# Patient Record
Sex: Female | Born: 1955 | Race: White | Hispanic: No | Smoking: Never smoker
Health system: Southern US, Community
[De-identification: ages and names within clinical notes are randomized; demographics above are authoritative.]

## PROBLEM LIST (undated history)

## (undated) DIAGNOSIS — Z9889 Other specified postprocedural states: Secondary | ICD-10-CM

## (undated) DIAGNOSIS — T4145XA Adverse effect of unspecified anesthetic, initial encounter: Secondary | ICD-10-CM

## (undated) DIAGNOSIS — E079 Disorder of thyroid, unspecified: Secondary | ICD-10-CM

## (undated) DIAGNOSIS — F419 Anxiety disorder, unspecified: Secondary | ICD-10-CM

## (undated) DIAGNOSIS — I10 Essential (primary) hypertension: Secondary | ICD-10-CM

## (undated) DIAGNOSIS — E039 Hypothyroidism, unspecified: Secondary | ICD-10-CM

## (undated) DIAGNOSIS — T8859XA Other complications of anesthesia, initial encounter: Secondary | ICD-10-CM

## (undated) DIAGNOSIS — R112 Nausea with vomiting, unspecified: Secondary | ICD-10-CM

## (undated) DIAGNOSIS — E119 Type 2 diabetes mellitus without complications: Secondary | ICD-10-CM

## (undated) DIAGNOSIS — M1990 Unspecified osteoarthritis, unspecified site: Secondary | ICD-10-CM

## (undated) DIAGNOSIS — E785 Hyperlipidemia, unspecified: Secondary | ICD-10-CM

## (undated) HISTORY — PX: EYE SURGERY: SHX253

## (undated) HISTORY — PX: JOINT REPLACEMENT: SHX530

## (undated) HISTORY — PX: BELPHAROPTOSIS REPAIR: SHX369

## (undated) HISTORY — PX: BACK SURGERY: SHX140

## (undated) HISTORY — PX: SHOULDER SURGERY: SHX246

## (undated) HISTORY — PX: REPLACEMENT TOTAL KNEE: SUR1224

## (undated) HISTORY — PX: DILATION AND CURETTAGE OF UTERUS: SHX78

---

## 1999-03-13 ENCOUNTER — Encounter: Payer: Self-pay | Admitting: Neurosurgery

## 1999-03-13 ENCOUNTER — Ambulatory Visit (HOSPITAL_COMMUNITY): Admission: RE | Admit: 1999-03-13 | Discharge: 1999-03-13 | Payer: Self-pay | Admitting: Neurosurgery

## 1999-03-26 ENCOUNTER — Ambulatory Visit (HOSPITAL_COMMUNITY): Admission: RE | Admit: 1999-03-26 | Discharge: 1999-03-26 | Payer: Self-pay | Admitting: Neurosurgery

## 1999-03-26 ENCOUNTER — Encounter: Payer: Self-pay | Admitting: Neurosurgery

## 1999-04-09 ENCOUNTER — Ambulatory Visit (HOSPITAL_COMMUNITY): Admission: RE | Admit: 1999-04-09 | Discharge: 1999-04-09 | Payer: Self-pay | Admitting: Neurosurgery

## 1999-04-09 ENCOUNTER — Encounter: Payer: Self-pay | Admitting: Neurosurgery

## 1999-07-04 ENCOUNTER — Ambulatory Visit (HOSPITAL_COMMUNITY): Admission: RE | Admit: 1999-07-04 | Discharge: 1999-07-04 | Payer: Self-pay | Admitting: Neurosurgery

## 1999-07-04 ENCOUNTER — Encounter: Payer: Self-pay | Admitting: Neurosurgery

## 1999-08-03 ENCOUNTER — Encounter: Payer: Self-pay | Admitting: Neurosurgery

## 1999-08-07 ENCOUNTER — Encounter: Payer: Self-pay | Admitting: Neurosurgery

## 1999-08-07 ENCOUNTER — Inpatient Hospital Stay (HOSPITAL_COMMUNITY): Admission: RE | Admit: 1999-08-07 | Discharge: 1999-08-11 | Payer: Self-pay | Admitting: Neurosurgery

## 1999-09-19 ENCOUNTER — Encounter: Payer: Self-pay | Admitting: Neurosurgery

## 1999-09-19 ENCOUNTER — Encounter: Admission: RE | Admit: 1999-09-19 | Discharge: 1999-09-19 | Payer: Self-pay | Admitting: Neurosurgery

## 2000-03-11 ENCOUNTER — Encounter: Payer: Self-pay | Admitting: Neurosurgery

## 2000-03-11 ENCOUNTER — Ambulatory Visit (HOSPITAL_COMMUNITY): Admission: RE | Admit: 2000-03-11 | Discharge: 2000-03-11 | Payer: Self-pay | Admitting: Neurosurgery

## 2000-05-30 ENCOUNTER — Inpatient Hospital Stay (HOSPITAL_COMMUNITY): Admission: RE | Admit: 2000-05-30 | Discharge: 2000-06-01 | Payer: Self-pay | Admitting: Neurosurgery

## 2000-05-30 ENCOUNTER — Encounter: Payer: Self-pay | Admitting: Neurosurgery

## 2000-06-09 ENCOUNTER — Encounter: Payer: Self-pay | Admitting: Neurosurgery

## 2000-06-09 ENCOUNTER — Encounter: Admission: RE | Admit: 2000-06-09 | Discharge: 2000-06-09 | Payer: Self-pay | Admitting: Neurosurgery

## 2000-09-30 ENCOUNTER — Ambulatory Visit (HOSPITAL_COMMUNITY): Admission: RE | Admit: 2000-09-30 | Discharge: 2000-09-30 | Payer: Self-pay | Admitting: Internal Medicine

## 2000-10-02 ENCOUNTER — Encounter: Payer: Self-pay | Admitting: Internal Medicine

## 2000-10-02 ENCOUNTER — Ambulatory Visit (HOSPITAL_COMMUNITY): Admission: RE | Admit: 2000-10-02 | Discharge: 2000-10-02 | Payer: Self-pay | Admitting: Internal Medicine

## 2001-10-07 ENCOUNTER — Ambulatory Visit (HOSPITAL_COMMUNITY): Admission: RE | Admit: 2001-10-07 | Discharge: 2001-10-07 | Payer: Self-pay | Admitting: Internal Medicine

## 2001-10-07 ENCOUNTER — Encounter: Payer: Self-pay | Admitting: Internal Medicine

## 2001-11-13 ENCOUNTER — Encounter: Admission: RE | Admit: 2001-11-13 | Discharge: 2001-11-13 | Payer: Self-pay | Admitting: Neurosurgery

## 2001-11-13 ENCOUNTER — Encounter: Payer: Self-pay | Admitting: Neurosurgery

## 2003-07-05 ENCOUNTER — Ambulatory Visit (HOSPITAL_COMMUNITY): Admission: RE | Admit: 2003-07-05 | Discharge: 2003-07-05 | Payer: Self-pay | Admitting: Internal Medicine

## 2004-04-27 ENCOUNTER — Ambulatory Visit (HOSPITAL_COMMUNITY): Admission: RE | Admit: 2004-04-27 | Discharge: 2004-04-27 | Payer: Self-pay | Admitting: Internal Medicine

## 2006-07-25 ENCOUNTER — Ambulatory Visit (HOSPITAL_COMMUNITY): Admission: RE | Admit: 2006-07-25 | Discharge: 2006-07-25 | Payer: Self-pay | Admitting: Internal Medicine

## 2007-11-16 ENCOUNTER — Emergency Department (HOSPITAL_COMMUNITY): Admission: EM | Admit: 2007-11-16 | Discharge: 2007-11-16 | Payer: Self-pay | Admitting: Emergency Medicine

## 2010-03-19 ENCOUNTER — Ambulatory Visit (HOSPITAL_COMMUNITY): Admission: RE | Admit: 2010-03-19 | Discharge: 2010-03-19 | Payer: Self-pay | Admitting: Gastroenterology

## 2010-05-01 ENCOUNTER — Ambulatory Visit (HOSPITAL_COMMUNITY): Admission: RE | Admit: 2010-05-01 | Discharge: 2010-05-01 | Payer: Self-pay | Admitting: Gastroenterology

## 2010-07-14 ENCOUNTER — Encounter: Payer: Self-pay | Admitting: Family Medicine

## 2010-07-15 ENCOUNTER — Encounter: Payer: Self-pay | Admitting: Neurosurgery

## 2010-07-16 ENCOUNTER — Ambulatory Visit (HOSPITAL_COMMUNITY): Admission: RE | Admit: 2010-07-16 | Payer: Self-pay | Source: Home / Self Care | Admitting: Gastroenterology

## 2010-11-09 NOTE — Op Note (Signed)
[place]. [facility]  Patient:    Monique Weiss, Monique Weiss                       MRN: [id number] Proc. Date: 08/07/99 Adm. Date:  [id number] Attending:  Emeterio Reeve                           Operative Report  PREOPERATIVE DIAGNOSIS:  Recurrent herniated disk, L4-5, severe spondylosis and  spinal stenosis, L3-4 and L2-3.  POSTOPERATIVE DIAGNOSIS:  Recurrent herniated disk, L4-5, severe spondylosis and spinal stenosis, L3-4 and L2-3.  OPERATION PERFORMED:  L2-3, L3-4, L4-5 laminectomy, diskectomy, posterior lumbar interbody fusion with Ray threaded fusion cage.  SURGEON:  Payton Doughty, M.D.  ANESTHESIA:  General endotracheal.  PREP:  Sterile Betadine prep and scrub with alcohol wipe.  COMPLICATIONS:  None.  ASSISTANT:  Hewitt Shorts, M.D.  INDICATIONS FOR PROCEDURE:  The patient is a 55 year old right-handed white female with severe spondylosis at 2-3, 3-4 and spondylosis and recurrent disk at 4-5.   DESCRIPTION OF PROCEDURE:  The patient was taken to the operating room, smoothly anesthetized and intubated, and placed prone on the operating table.  Following  shave, prep and drape in the usual sterile fashion the skin was infiltrated with 1% lidocaine 1:400,000 epinephrine and the skin was incised from the bottom of L4 o the top of L2.  The lamina of L2, L3 and L4 were exposed bilaterally in the subperiosteal plane.  Intraoperative x-ray was taken to confirm correctness of he level.  Starting on the left side, the pars interarticularis lamina and inferior facet of L4, L3 and L2 were removed bilaterally.  Similarly the superior facet f L5, L4 and L3 were removed bilaterally.  On the left side at the previous laminectomy site after removal of ligamentum flavum there was found to be recurrent herniated disk which was removed without difficulty.  The other level removal of the ligamentum flavum, dissection into the nerve roots at each  pedicle revealed  severe spondylosis and lateral recess narrowing.  Having relieved this narrowing by decompression, the nerve roots were carefully explored and found to be free. Diskectomy was then carried out at 2-3 and 3-4 bilaterally.  The wound was irrigated and hemostasis assured.  The nerve roots inspected for decompression.  Ray cages were then placed, 12 x 21 mm at 4-5 and 14 x 21 mm at 3-4 and 2-3. Intraoperative x-ray showed good placement of cages.  They were packed with bone graft harvested from facet joints and then capped.  The wound was irrigated and  hemostasis assured.  The fascia was reapproximated with 0 Vicryl in interrupted  fashion.  The subcutaneous tissues were reapproximated with 0 Vicryl in interrupted fashion.  The subcuticular tissues were reapproximated with 3-0 Vicryl in interruted fashion. The skin was closed with 3-0 nylon in a running locked fashion. Betadine Telfa dressing was appled and made occlusive with Op-Site.  The patient then returned to the recovery room in good condition. DD:  08/07/99 TD:  08/07/99 Job: [id number]

## 2010-11-09 NOTE — Op Note (Signed)
[place]. [facility]  Patient:    DOCIA, KLAR                       MRN: [id number] Proc. Date: 05/30/00 Adm. Date:  [id number] Attending:  Emeterio Reeve                           Operative Report  PREOPERATIVE DIAGNOSIS:  Spondylosis with osteophytes at L5-S1 on the left side.  POSTOPERATIVE DIAGNOSIS:  Spondylosis with osteophytes at L5-S1 on the left side.  PROCEDURE:  L5-S1 laminectomy, diskectomy, posterior lumbar interbody fusion with the Ray threaded fusion cage.  SURGEON:  Payton Doughty, M.D.  ASSISTANT:  Stefani Dama, M.D.  ANESTHESIA:  Sterile Betadine prep and scrub with alcohol wipe.  COMPLICATIONS:  None.  DESCRIPTION OF PROCEDURE:  A 55 year old right-handed white lady with severe spondylosis and left L5 radiculopathy.  She was taken to the operating room, smoothly anesthetized and intubated, placed prone on the operating table. Following shave, prepped and draped in the usual fashion.  The skin was infiltrated with 1% lidocaine with 1:400,000 epinephrine.  Her old skin incision done by me was extended inferiorly approximately 6 cm to expand the L5 and S1 interspace.  The L5 and S1 lamina were exposed bilaterally out over the 5-1 facet joints, and intraoperative x-ray confirmed correctness of the level.  The pars interarticularis, lamina, and inferior facet of L5 and the superior facet of S1 were removed bilaterally, as well as the ligamentum flavum.  This exposed the L5 root and the S1 root as they traversed their respective pedicles.  On the left side, there was a large bridging osteophyte extending from the superior aspect of S1 making contact with the pedicle of L5, with significant encumbrance of the left L5 root as it entered its foramen.  The S1 root was similarly affected.  The nerve roots were carefully dissected and retracted, and the osteotome was used to remove the osteophyte and uncover the disk space.  Diskectomy  was then carried out in a routine fashion.  On the right side, there was no osteophyte, simply a large bulging disk.  This was also removed without difficulty.  Thus, both 5 and both 1 roots were decompressed.  Ray threaded 12 x 21 fusion cages were then placed. Intraoperative x-ray showed good placement of the cages.  They were packed with bone graft harvested from the facet joints and cap.  The wound was irrigated, hemostasis assured.  The fascia was approximated with 0 Vicryl in interrupted fashion.  Subcutaneous tissue was reapproximated with 0 Vicryl in interrupted fashion.  Skin was closed with 3-0 nylon in a running locked fashion.  A Betadine and Telfa dressing was applied and made occlusive with OpSite.  The patient then returned to the recovery room in good condition. DD:  05/30/00 TD:  05/30/00 Job: [id number]

## 2011-03-20 LAB — DIFFERENTIAL
Basophils Absolute: 0
Basophils Relative: 0
Eosinophils Absolute: 0.1
Eosinophils Relative: 1
Lymphocytes Relative: 16
Lymphs Abs: 1.9
Monocytes Absolute: 1
Monocytes Relative: 9
Neutro Abs: 8.8 — ABNORMAL HIGH
Neutrophils Relative %: 74

## 2011-03-20 LAB — BASIC METABOLIC PANEL
BUN: 12
CO2: 28
Calcium: 8.8
Chloride: 103
Creatinine, Ser: 0.89
GFR calc Af Amer: >60
GFR calc non Af Amer: >60
Glucose, Bld: 126 — ABNORMAL HIGH
Potassium: 3.2 — ABNORMAL LOW
Sodium: 138

## 2011-03-20 LAB — CBC
HCT: 40.5
Hemoglobin: 14.2
MCHC: 35.1
MCV: 90.8
Platelets: 179
RBC: 4.46
RDW: 12.4
WBC: 11.8 — ABNORMAL HIGH

## 2011-05-01 ENCOUNTER — Other Ambulatory Visit (HOSPITAL_COMMUNITY): Payer: Self-pay | Admitting: Physician Assistant

## 2011-05-01 DIAGNOSIS — Z139 Encounter for screening, unspecified: Secondary | ICD-10-CM

## 2011-05-06 ENCOUNTER — Other Ambulatory Visit: Payer: Self-pay | Admitting: Obstetrics and Gynecology

## 2011-05-06 DIAGNOSIS — Z139 Encounter for screening, unspecified: Secondary | ICD-10-CM

## 2011-05-07 ENCOUNTER — Ambulatory Visit (HOSPITAL_COMMUNITY)
Admission: RE | Admit: 2011-05-07 | Discharge: 2011-05-07 | Disposition: A | Discharge status: Home / Self Care | Payer: Self-pay | Source: Ambulatory Visit | Attending: Physician Assistant | Admitting: Physician Assistant

## 2011-05-07 DIAGNOSIS — Z139 Encounter for screening, unspecified: Secondary | ICD-10-CM

## 2012-04-14 ENCOUNTER — Other Ambulatory Visit (HOSPITAL_COMMUNITY): Payer: Self-pay | Admitting: Physician Assistant

## 2012-04-14 DIAGNOSIS — Z139 Encounter for screening, unspecified: Secondary | ICD-10-CM

## 2012-05-11 ENCOUNTER — Ambulatory Visit (HOSPITAL_COMMUNITY): Payer: Self-pay

## 2012-05-12 ENCOUNTER — Ambulatory Visit (HOSPITAL_COMMUNITY): Payer: Self-pay

## 2012-05-14 ENCOUNTER — Other Ambulatory Visit (HOSPITAL_COMMUNITY): Payer: Self-pay | Admitting: Physician Assistant

## 2012-05-14 DIAGNOSIS — Z139 Encounter for screening, unspecified: Secondary | ICD-10-CM

## 2012-05-19 ENCOUNTER — Ambulatory Visit (HOSPITAL_COMMUNITY)
Admission: RE | Admit: 2012-05-19 | Discharge: 2012-05-19 | Disposition: A | Discharge status: Home / Self Care | Payer: Self-pay | Source: Ambulatory Visit | Attending: Physician Assistant | Admitting: Physician Assistant

## 2012-05-19 ENCOUNTER — Ambulatory Visit (HOSPITAL_COMMUNITY): Payer: Self-pay

## 2012-05-19 DIAGNOSIS — Z139 Encounter for screening, unspecified: Secondary | ICD-10-CM

## 2013-03-26 ENCOUNTER — Encounter: Payer: Self-pay | Admitting: Obstetrics and Gynecology

## 2014-01-26 ENCOUNTER — Ambulatory Visit (HOSPITAL_COMMUNITY)
Admission: RE | Admit: 2014-01-26 | Discharge: 2014-01-26 | Disposition: A | Discharge status: Home / Self Care | Payer: BC Managed Care – PPO | Source: Ambulatory Visit | Attending: Internal Medicine | Admitting: Internal Medicine

## 2014-01-26 ENCOUNTER — Other Ambulatory Visit (HOSPITAL_COMMUNITY): Payer: Self-pay | Admitting: Internal Medicine

## 2014-01-26 DIAGNOSIS — M25511 Pain in right shoulder: Secondary | ICD-10-CM

## 2014-01-26 DIAGNOSIS — M259 Joint disorder, unspecified: Secondary | ICD-10-CM | POA: Insufficient documentation

## 2014-01-26 DIAGNOSIS — M25519 Pain in unspecified shoulder: Secondary | ICD-10-CM | POA: Insufficient documentation

## 2014-02-18 ENCOUNTER — Other Ambulatory Visit (HOSPITAL_COMMUNITY): Payer: Self-pay | Admitting: Rheumatology

## 2014-02-18 ENCOUNTER — Ambulatory Visit (HOSPITAL_COMMUNITY)
Admission: RE | Admit: 2014-02-18 | Discharge: 2014-02-18 | Disposition: A | Discharge status: Home / Self Care | Payer: BC Managed Care – PPO | Source: Ambulatory Visit | Attending: Rheumatology | Admitting: Rheumatology

## 2014-02-18 DIAGNOSIS — M542 Cervicalgia: Secondary | ICD-10-CM | POA: Diagnosis present

## 2014-02-18 DIAGNOSIS — M25519 Pain in unspecified shoulder: Secondary | ICD-10-CM | POA: Insufficient documentation

## 2015-10-04 DIAGNOSIS — M064 Inflammatory polyarthropathy: Secondary | ICD-10-CM | POA: Diagnosis not present

## 2015-10-04 DIAGNOSIS — M130 Polyarthritis, unspecified: Secondary | ICD-10-CM | POA: Diagnosis not present

## 2015-10-04 DIAGNOSIS — R5383 Other fatigue: Secondary | ICD-10-CM | POA: Diagnosis not present

## 2015-10-04 DIAGNOSIS — M150 Primary generalized (osteo)arthritis: Secondary | ICD-10-CM | POA: Diagnosis not present

## 2015-11-09 DIAGNOSIS — Z6826 Body mass index (BMI) 26.0-26.9, adult: Secondary | ICD-10-CM | POA: Diagnosis not present

## 2015-11-09 DIAGNOSIS — J019 Acute sinusitis, unspecified: Secondary | ICD-10-CM | POA: Diagnosis not present

## 2015-11-16 DIAGNOSIS — Z79899 Other long term (current) drug therapy: Secondary | ICD-10-CM | POA: Diagnosis not present

## 2015-11-16 DIAGNOSIS — M1990 Unspecified osteoarthritis, unspecified site: Secondary | ICD-10-CM | POA: Diagnosis not present

## 2015-11-16 DIAGNOSIS — I10 Essential (primary) hypertension: Secondary | ICD-10-CM | POA: Diagnosis not present

## 2015-11-29 DIAGNOSIS — R739 Hyperglycemia, unspecified: Secondary | ICD-10-CM | POA: Diagnosis not present

## 2015-11-29 DIAGNOSIS — H05229 Edema of unspecified orbit: Secondary | ICD-10-CM | POA: Diagnosis not present

## 2015-12-04 DIAGNOSIS — M130 Polyarthritis, unspecified: Secondary | ICD-10-CM | POA: Diagnosis not present

## 2015-12-04 DIAGNOSIS — M064 Inflammatory polyarthropathy: Secondary | ICD-10-CM | POA: Diagnosis not present

## 2015-12-04 DIAGNOSIS — M150 Primary generalized (osteo)arthritis: Secondary | ICD-10-CM | POA: Diagnosis not present

## 2015-12-04 DIAGNOSIS — R5383 Other fatigue: Secondary | ICD-10-CM | POA: Diagnosis not present

## 2015-12-04 DIAGNOSIS — R768 Other specified abnormal immunological findings in serum: Secondary | ICD-10-CM | POA: Diagnosis not present

## 2016-03-01 DIAGNOSIS — Z23 Encounter for immunization: Secondary | ICD-10-CM | POA: Diagnosis not present

## 2016-03-15 DIAGNOSIS — R5383 Other fatigue: Secondary | ICD-10-CM | POA: Diagnosis not present

## 2016-03-15 DIAGNOSIS — R768 Other specified abnormal immunological findings in serum: Secondary | ICD-10-CM | POA: Diagnosis not present

## 2016-03-15 DIAGNOSIS — M150 Primary generalized (osteo)arthritis: Secondary | ICD-10-CM | POA: Diagnosis not present

## 2016-03-15 DIAGNOSIS — M130 Polyarthritis, unspecified: Secondary | ICD-10-CM | POA: Diagnosis not present

## 2016-03-15 DIAGNOSIS — M25472 Effusion, left ankle: Secondary | ICD-10-CM | POA: Diagnosis not present

## 2016-03-15 DIAGNOSIS — M064 Inflammatory polyarthropathy: Secondary | ICD-10-CM | POA: Diagnosis not present

## 2016-03-27 DIAGNOSIS — I10 Essential (primary) hypertension: Secondary | ICD-10-CM | POA: Diagnosis not present

## 2016-03-27 DIAGNOSIS — Z79899 Other long term (current) drug therapy: Secondary | ICD-10-CM | POA: Diagnosis not present

## 2016-03-27 DIAGNOSIS — M1990 Unspecified osteoarthritis, unspecified site: Secondary | ICD-10-CM | POA: Diagnosis not present

## 2016-03-27 DIAGNOSIS — E119 Type 2 diabetes mellitus without complications: Secondary | ICD-10-CM | POA: Diagnosis not present

## 2016-03-27 DIAGNOSIS — E039 Hypothyroidism, unspecified: Secondary | ICD-10-CM | POA: Diagnosis not present

## 2016-04-04 DIAGNOSIS — E785 Hyperlipidemia, unspecified: Secondary | ICD-10-CM | POA: Diagnosis not present

## 2016-04-04 DIAGNOSIS — E039 Hypothyroidism, unspecified: Secondary | ICD-10-CM | POA: Diagnosis not present

## 2016-04-04 DIAGNOSIS — E119 Type 2 diabetes mellitus without complications: Secondary | ICD-10-CM | POA: Diagnosis not present

## 2016-04-09 ENCOUNTER — Other Ambulatory Visit (HOSPITAL_COMMUNITY): Payer: Self-pay | Admitting: Internal Medicine

## 2016-04-09 DIAGNOSIS — Z1231 Encounter for screening mammogram for malignant neoplasm of breast: Secondary | ICD-10-CM

## 2016-04-25 ENCOUNTER — Ambulatory Visit (HOSPITAL_COMMUNITY)
Admission: RE | Admit: 2016-04-25 | Discharge: 2016-04-25 | Disposition: A | Discharge status: Home / Self Care | Payer: BLUE CROSS/BLUE SHIELD | Source: Ambulatory Visit | Attending: Internal Medicine | Admitting: Internal Medicine

## 2016-04-25 DIAGNOSIS — Z1231 Encounter for screening mammogram for malignant neoplasm of breast: Secondary | ICD-10-CM | POA: Diagnosis not present

## 2016-05-20 DIAGNOSIS — M150 Primary generalized (osteo)arthritis: Secondary | ICD-10-CM | POA: Diagnosis not present

## 2016-05-20 DIAGNOSIS — M542 Cervicalgia: Secondary | ICD-10-CM | POA: Diagnosis not present

## 2016-05-20 DIAGNOSIS — M064 Inflammatory polyarthropathy: Secondary | ICD-10-CM | POA: Diagnosis not present

## 2016-05-20 DIAGNOSIS — R5383 Other fatigue: Secondary | ICD-10-CM | POA: Diagnosis not present

## 2016-05-21 DIAGNOSIS — E119 Type 2 diabetes mellitus without complications: Secondary | ICD-10-CM | POA: Diagnosis not present

## 2016-05-21 DIAGNOSIS — R05 Cough: Secondary | ICD-10-CM | POA: Diagnosis not present

## 2016-06-04 DIAGNOSIS — B352 Tinea manuum: Secondary | ICD-10-CM | POA: Diagnosis not present

## 2016-06-04 DIAGNOSIS — C4402 Squamous cell carcinoma of skin of lip: Secondary | ICD-10-CM | POA: Diagnosis not present

## 2016-06-04 DIAGNOSIS — B355 Tinea imbricata: Secondary | ICD-10-CM | POA: Diagnosis not present

## 2016-06-04 DIAGNOSIS — C44329 Squamous cell carcinoma of skin of other parts of face: Secondary | ICD-10-CM | POA: Diagnosis not present

## 2016-06-04 DIAGNOSIS — L820 Inflamed seborrheic keratosis: Secondary | ICD-10-CM | POA: Diagnosis not present

## 2016-07-05 DIAGNOSIS — E119 Type 2 diabetes mellitus without complications: Secondary | ICD-10-CM | POA: Diagnosis not present

## 2016-07-05 DIAGNOSIS — E785 Hyperlipidemia, unspecified: Secondary | ICD-10-CM | POA: Diagnosis not present

## 2016-07-05 DIAGNOSIS — Z79899 Other long term (current) drug therapy: Secondary | ICD-10-CM | POA: Diagnosis not present

## 2016-07-15 DIAGNOSIS — R5383 Other fatigue: Secondary | ICD-10-CM | POA: Diagnosis not present

## 2016-07-15 DIAGNOSIS — R768 Other specified abnormal immunological findings in serum: Secondary | ICD-10-CM | POA: Diagnosis not present

## 2016-07-15 DIAGNOSIS — M064 Inflammatory polyarthropathy: Secondary | ICD-10-CM | POA: Diagnosis not present

## 2016-07-15 DIAGNOSIS — M542 Cervicalgia: Secondary | ICD-10-CM | POA: Diagnosis not present

## 2016-07-15 DIAGNOSIS — M150 Primary generalized (osteo)arthritis: Secondary | ICD-10-CM | POA: Diagnosis not present

## 2016-07-15 DIAGNOSIS — M25562 Pain in left knee: Secondary | ICD-10-CM | POA: Diagnosis not present

## 2016-07-29 DIAGNOSIS — Z6841 Body Mass Index (BMI) 40.0 and over, adult: Secondary | ICD-10-CM | POA: Diagnosis not present

## 2016-07-29 DIAGNOSIS — E785 Hyperlipidemia, unspecified: Secondary | ICD-10-CM | POA: Diagnosis not present

## 2016-07-29 DIAGNOSIS — E119 Type 2 diabetes mellitus without complications: Secondary | ICD-10-CM | POA: Diagnosis not present

## 2016-08-12 DIAGNOSIS — Z08 Encounter for follow-up examination after completed treatment for malignant neoplasm: Secondary | ICD-10-CM | POA: Diagnosis not present

## 2016-08-12 DIAGNOSIS — B352 Tinea manuum: Secondary | ICD-10-CM | POA: Diagnosis not present

## 2016-08-12 DIAGNOSIS — L905 Scar conditions and fibrosis of skin: Secondary | ICD-10-CM | POA: Diagnosis not present

## 2016-08-12 DIAGNOSIS — Z85828 Personal history of other malignant neoplasm of skin: Secondary | ICD-10-CM | POA: Diagnosis not present

## 2016-09-09 DIAGNOSIS — M79642 Pain in left hand: Secondary | ICD-10-CM | POA: Diagnosis not present

## 2016-09-09 DIAGNOSIS — M79641 Pain in right hand: Secondary | ICD-10-CM | POA: Diagnosis not present

## 2016-09-09 DIAGNOSIS — M1711 Unilateral primary osteoarthritis, right knee: Secondary | ICD-10-CM | POA: Diagnosis not present

## 2016-09-09 DIAGNOSIS — F5104 Psychophysiologic insomnia: Secondary | ICD-10-CM | POA: Diagnosis not present

## 2016-09-09 DIAGNOSIS — M1712 Unilateral primary osteoarthritis, left knee: Secondary | ICD-10-CM | POA: Diagnosis not present

## 2016-10-14 DIAGNOSIS — M542 Cervicalgia: Secondary | ICD-10-CM | POA: Diagnosis not present

## 2016-10-14 DIAGNOSIS — M150 Primary generalized (osteo)arthritis: Secondary | ICD-10-CM | POA: Diagnosis not present

## 2016-10-14 DIAGNOSIS — M064 Inflammatory polyarthropathy: Secondary | ICD-10-CM | POA: Diagnosis not present

## 2016-10-14 DIAGNOSIS — R5383 Other fatigue: Secondary | ICD-10-CM | POA: Diagnosis not present

## 2016-10-16 DIAGNOSIS — M1711 Unilateral primary osteoarthritis, right knee: Secondary | ICD-10-CM | POA: Diagnosis not present

## 2016-10-16 DIAGNOSIS — M1712 Unilateral primary osteoarthritis, left knee: Secondary | ICD-10-CM | POA: Diagnosis not present

## 2016-11-22 DIAGNOSIS — E119 Type 2 diabetes mellitus without complications: Secondary | ICD-10-CM | POA: Diagnosis not present

## 2016-11-22 DIAGNOSIS — E039 Hypothyroidism, unspecified: Secondary | ICD-10-CM | POA: Diagnosis not present

## 2016-11-22 DIAGNOSIS — E785 Hyperlipidemia, unspecified: Secondary | ICD-10-CM | POA: Diagnosis not present

## 2016-11-22 DIAGNOSIS — I10 Essential (primary) hypertension: Secondary | ICD-10-CM | POA: Diagnosis not present

## 2016-11-27 DIAGNOSIS — M1711 Unilateral primary osteoarthritis, right knee: Secondary | ICD-10-CM | POA: Diagnosis not present

## 2016-11-27 DIAGNOSIS — M1712 Unilateral primary osteoarthritis, left knee: Secondary | ICD-10-CM | POA: Diagnosis not present

## 2016-12-05 DIAGNOSIS — E119 Type 2 diabetes mellitus without complications: Secondary | ICD-10-CM | POA: Diagnosis not present

## 2017-01-06 DIAGNOSIS — M1712 Unilateral primary osteoarthritis, left knee: Secondary | ICD-10-CM | POA: Diagnosis not present

## 2017-01-06 DIAGNOSIS — M1711 Unilateral primary osteoarthritis, right knee: Secondary | ICD-10-CM | POA: Diagnosis not present

## 2017-01-13 DIAGNOSIS — M1711 Unilateral primary osteoarthritis, right knee: Secondary | ICD-10-CM | POA: Diagnosis not present

## 2017-01-13 DIAGNOSIS — M1712 Unilateral primary osteoarthritis, left knee: Secondary | ICD-10-CM | POA: Diagnosis not present

## 2017-01-20 DIAGNOSIS — M1712 Unilateral primary osteoarthritis, left knee: Secondary | ICD-10-CM | POA: Diagnosis not present

## 2017-01-20 DIAGNOSIS — M1711 Unilateral primary osteoarthritis, right knee: Secondary | ICD-10-CM | POA: Diagnosis not present

## 2017-01-28 DIAGNOSIS — M542 Cervicalgia: Secondary | ICD-10-CM | POA: Diagnosis not present

## 2017-01-28 DIAGNOSIS — M0609 Rheumatoid arthritis without rheumatoid factor, multiple sites: Secondary | ICD-10-CM | POA: Diagnosis not present

## 2017-01-28 DIAGNOSIS — R5383 Other fatigue: Secondary | ICD-10-CM | POA: Diagnosis not present

## 2017-01-28 DIAGNOSIS — M150 Primary generalized (osteo)arthritis: Secondary | ICD-10-CM | POA: Diagnosis not present

## 2017-02-11 DIAGNOSIS — M0609 Rheumatoid arthritis without rheumatoid factor, multiple sites: Secondary | ICD-10-CM | POA: Diagnosis not present

## 2017-02-19 DIAGNOSIS — M1711 Unilateral primary osteoarthritis, right knee: Secondary | ICD-10-CM | POA: Diagnosis not present

## 2017-02-19 DIAGNOSIS — M7541 Impingement syndrome of right shoulder: Secondary | ICD-10-CM | POA: Diagnosis not present

## 2017-02-19 DIAGNOSIS — M1712 Unilateral primary osteoarthritis, left knee: Secondary | ICD-10-CM | POA: Diagnosis not present

## 2017-02-19 DIAGNOSIS — M1990 Unspecified osteoarthritis, unspecified site: Secondary | ICD-10-CM | POA: Diagnosis not present

## 2017-02-20 DIAGNOSIS — M1990 Unspecified osteoarthritis, unspecified site: Secondary | ICD-10-CM | POA: Diagnosis not present

## 2017-02-20 DIAGNOSIS — M1711 Unilateral primary osteoarthritis, right knee: Secondary | ICD-10-CM | POA: Diagnosis not present

## 2017-02-20 DIAGNOSIS — M7541 Impingement syndrome of right shoulder: Secondary | ICD-10-CM | POA: Diagnosis not present

## 2017-02-20 DIAGNOSIS — M1712 Unilateral primary osteoarthritis, left knee: Secondary | ICD-10-CM | POA: Diagnosis not present

## 2017-02-25 DIAGNOSIS — Z85828 Personal history of other malignant neoplasm of skin: Secondary | ICD-10-CM | POA: Diagnosis not present

## 2017-02-25 DIAGNOSIS — Z08 Encounter for follow-up examination after completed treatment for malignant neoplasm: Secondary | ICD-10-CM | POA: Diagnosis not present

## 2017-02-25 DIAGNOSIS — B352 Tinea manuum: Secondary | ICD-10-CM | POA: Diagnosis not present

## 2017-02-25 DIAGNOSIS — B351 Tinea unguium: Secondary | ICD-10-CM | POA: Diagnosis not present

## 2017-03-03 DIAGNOSIS — M1711 Unilateral primary osteoarthritis, right knee: Secondary | ICD-10-CM | POA: Diagnosis not present

## 2017-03-11 DIAGNOSIS — M0609 Rheumatoid arthritis without rheumatoid factor, multiple sites: Secondary | ICD-10-CM | POA: Diagnosis not present

## 2017-03-17 DIAGNOSIS — M172 Bilateral post-traumatic osteoarthritis of knee: Secondary | ICD-10-CM | POA: Diagnosis not present

## 2017-03-28 DIAGNOSIS — M1990 Unspecified osteoarthritis, unspecified site: Secondary | ICD-10-CM | POA: Diagnosis not present

## 2017-03-28 DIAGNOSIS — M1712 Unilateral primary osteoarthritis, left knee: Secondary | ICD-10-CM | POA: Diagnosis not present

## 2017-03-31 DIAGNOSIS — M150 Primary generalized (osteo)arthritis: Secondary | ICD-10-CM | POA: Diagnosis not present

## 2017-03-31 DIAGNOSIS — M0609 Rheumatoid arthritis without rheumatoid factor, multiple sites: Secondary | ICD-10-CM | POA: Diagnosis not present

## 2017-03-31 DIAGNOSIS — R5383 Other fatigue: Secondary | ICD-10-CM | POA: Diagnosis not present

## 2017-04-01 DIAGNOSIS — Z96651 Presence of right artificial knee joint: Secondary | ICD-10-CM | POA: Diagnosis not present

## 2017-04-01 DIAGNOSIS — M21162 Varus deformity, not elsewhere classified, left knee: Secondary | ICD-10-CM | POA: Diagnosis not present

## 2017-04-01 DIAGNOSIS — M1712 Unilateral primary osteoarthritis, left knee: Secondary | ICD-10-CM | POA: Diagnosis not present

## 2017-04-08 DIAGNOSIS — M1712 Unilateral primary osteoarthritis, left knee: Secondary | ICD-10-CM | POA: Diagnosis not present

## 2017-04-08 DIAGNOSIS — Z9889 Other specified postprocedural states: Secondary | ICD-10-CM | POA: Diagnosis not present

## 2017-04-14 DIAGNOSIS — Z96652 Presence of left artificial knee joint: Secondary | ICD-10-CM | POA: Diagnosis not present

## 2017-04-14 DIAGNOSIS — M1712 Unilateral primary osteoarthritis, left knee: Secondary | ICD-10-CM | POA: Diagnosis not present

## 2017-04-14 DIAGNOSIS — Z471 Aftercare following joint replacement surgery: Secondary | ICD-10-CM | POA: Diagnosis not present

## 2017-04-16 DIAGNOSIS — E119 Type 2 diabetes mellitus without complications: Secondary | ICD-10-CM | POA: Diagnosis not present

## 2017-04-16 DIAGNOSIS — E785 Hyperlipidemia, unspecified: Secondary | ICD-10-CM | POA: Diagnosis not present

## 2017-04-16 DIAGNOSIS — R601 Generalized edema: Secondary | ICD-10-CM | POA: Diagnosis not present

## 2017-04-16 DIAGNOSIS — M1712 Unilateral primary osteoarthritis, left knee: Secondary | ICD-10-CM | POA: Diagnosis not present

## 2017-04-16 DIAGNOSIS — Z79899 Other long term (current) drug therapy: Secondary | ICD-10-CM | POA: Diagnosis not present

## 2017-04-16 DIAGNOSIS — E039 Hypothyroidism, unspecified: Secondary | ICD-10-CM | POA: Diagnosis not present

## 2017-04-17 DIAGNOSIS — M1712 Unilateral primary osteoarthritis, left knee: Secondary | ICD-10-CM | POA: Diagnosis not present

## 2017-04-17 DIAGNOSIS — Z23 Encounter for immunization: Secondary | ICD-10-CM | POA: Diagnosis not present

## 2017-04-21 DIAGNOSIS — L600 Ingrowing nail: Secondary | ICD-10-CM | POA: Diagnosis not present

## 2017-04-21 DIAGNOSIS — M79671 Pain in right foot: Secondary | ICD-10-CM | POA: Diagnosis not present

## 2017-04-21 DIAGNOSIS — M1712 Unilateral primary osteoarthritis, left knee: Secondary | ICD-10-CM | POA: Diagnosis not present

## 2017-04-21 DIAGNOSIS — E1151 Type 2 diabetes mellitus with diabetic peripheral angiopathy without gangrene: Secondary | ICD-10-CM | POA: Diagnosis not present

## 2017-04-21 DIAGNOSIS — E1140 Type 2 diabetes mellitus with diabetic neuropathy, unspecified: Secondary | ICD-10-CM | POA: Diagnosis not present

## 2017-04-23 DIAGNOSIS — E785 Hyperlipidemia, unspecified: Secondary | ICD-10-CM | POA: Diagnosis not present

## 2017-04-23 DIAGNOSIS — E039 Hypothyroidism, unspecified: Secondary | ICD-10-CM | POA: Diagnosis not present

## 2017-04-23 DIAGNOSIS — E119 Type 2 diabetes mellitus without complications: Secondary | ICD-10-CM | POA: Diagnosis not present

## 2017-04-28 DIAGNOSIS — M1712 Unilateral primary osteoarthritis, left knee: Secondary | ICD-10-CM | POA: Diagnosis not present

## 2017-04-30 DIAGNOSIS — M1712 Unilateral primary osteoarthritis, left knee: Secondary | ICD-10-CM | POA: Diagnosis not present

## 2017-05-05 DIAGNOSIS — M1712 Unilateral primary osteoarthritis, left knee: Secondary | ICD-10-CM | POA: Diagnosis not present

## 2017-05-05 DIAGNOSIS — Z9889 Other specified postprocedural states: Secondary | ICD-10-CM | POA: Diagnosis not present

## 2017-05-06 DIAGNOSIS — M0609 Rheumatoid arthritis without rheumatoid factor, multiple sites: Secondary | ICD-10-CM | POA: Diagnosis not present

## 2017-05-08 DIAGNOSIS — M1712 Unilateral primary osteoarthritis, left knee: Secondary | ICD-10-CM | POA: Diagnosis not present

## 2017-05-13 DIAGNOSIS — M1712 Unilateral primary osteoarthritis, left knee: Secondary | ICD-10-CM | POA: Diagnosis not present

## 2017-05-22 DIAGNOSIS — M1712 Unilateral primary osteoarthritis, left knee: Secondary | ICD-10-CM | POA: Diagnosis not present

## 2017-05-26 DIAGNOSIS — M1712 Unilateral primary osteoarthritis, left knee: Secondary | ICD-10-CM | POA: Diagnosis not present

## 2017-05-30 DIAGNOSIS — M1712 Unilateral primary osteoarthritis, left knee: Secondary | ICD-10-CM | POA: Diagnosis not present

## 2017-06-03 DIAGNOSIS — Z79899 Other long term (current) drug therapy: Secondary | ICD-10-CM | POA: Diagnosis not present

## 2017-06-03 DIAGNOSIS — M0609 Rheumatoid arthritis without rheumatoid factor, multiple sites: Secondary | ICD-10-CM | POA: Diagnosis not present

## 2017-06-06 DIAGNOSIS — M1712 Unilateral primary osteoarthritis, left knee: Secondary | ICD-10-CM | POA: Diagnosis not present

## 2017-06-06 DIAGNOSIS — Z9889 Other specified postprocedural states: Secondary | ICD-10-CM | POA: Diagnosis not present

## 2017-06-10 DIAGNOSIS — M1712 Unilateral primary osteoarthritis, left knee: Secondary | ICD-10-CM | POA: Diagnosis not present

## 2017-06-13 DIAGNOSIS — M1712 Unilateral primary osteoarthritis, left knee: Secondary | ICD-10-CM | POA: Diagnosis not present

## 2017-06-30 DIAGNOSIS — M1712 Unilateral primary osteoarthritis, left knee: Secondary | ICD-10-CM | POA: Diagnosis not present

## 2017-07-03 DIAGNOSIS — M0609 Rheumatoid arthritis without rheumatoid factor, multiple sites: Secondary | ICD-10-CM | POA: Diagnosis not present

## 2017-07-04 DIAGNOSIS — M1712 Unilateral primary osteoarthritis, left knee: Secondary | ICD-10-CM | POA: Diagnosis not present

## 2017-07-07 DIAGNOSIS — M1712 Unilateral primary osteoarthritis, left knee: Secondary | ICD-10-CM | POA: Diagnosis not present

## 2017-07-07 DIAGNOSIS — M25511 Pain in right shoulder: Secondary | ICD-10-CM | POA: Diagnosis not present

## 2017-07-07 DIAGNOSIS — Z96652 Presence of left artificial knee joint: Secondary | ICD-10-CM | POA: Diagnosis not present

## 2017-07-07 DIAGNOSIS — M1711 Unilateral primary osteoarthritis, right knee: Secondary | ICD-10-CM | POA: Diagnosis not present

## 2017-07-11 DIAGNOSIS — M542 Cervicalgia: Secondary | ICD-10-CM | POA: Diagnosis not present

## 2017-07-11 DIAGNOSIS — M0609 Rheumatoid arthritis without rheumatoid factor, multiple sites: Secondary | ICD-10-CM | POA: Diagnosis not present

## 2017-07-11 DIAGNOSIS — M150 Primary generalized (osteo)arthritis: Secondary | ICD-10-CM | POA: Diagnosis not present

## 2017-07-24 DIAGNOSIS — Z79899 Other long term (current) drug therapy: Secondary | ICD-10-CM | POA: Diagnosis not present

## 2017-07-24 DIAGNOSIS — E119 Type 2 diabetes mellitus without complications: Secondary | ICD-10-CM | POA: Diagnosis not present

## 2017-07-24 DIAGNOSIS — E039 Hypothyroidism, unspecified: Secondary | ICD-10-CM | POA: Diagnosis not present

## 2017-07-24 DIAGNOSIS — E785 Hyperlipidemia, unspecified: Secondary | ICD-10-CM | POA: Diagnosis not present

## 2017-07-26 DIAGNOSIS — M069 Rheumatoid arthritis, unspecified: Secondary | ICD-10-CM | POA: Diagnosis present

## 2017-07-26 DIAGNOSIS — H109 Unspecified conjunctivitis: Secondary | ICD-10-CM | POA: Diagnosis not present

## 2017-07-26 DIAGNOSIS — J0100 Acute maxillary sinusitis, unspecified: Secondary | ICD-10-CM | POA: Diagnosis not present

## 2017-07-31 DIAGNOSIS — E119 Type 2 diabetes mellitus without complications: Secondary | ICD-10-CM | POA: Diagnosis not present

## 2017-07-31 DIAGNOSIS — R609 Edema, unspecified: Secondary | ICD-10-CM | POA: Diagnosis not present

## 2017-07-31 DIAGNOSIS — Z6839 Body mass index (BMI) 39.0-39.9, adult: Secondary | ICD-10-CM | POA: Diagnosis not present

## 2017-07-31 DIAGNOSIS — M069 Rheumatoid arthritis, unspecified: Secondary | ICD-10-CM | POA: Diagnosis not present

## 2017-08-06 DIAGNOSIS — M0609 Rheumatoid arthritis without rheumatoid factor, multiple sites: Secondary | ICD-10-CM | POA: Diagnosis not present

## 2017-08-20 DIAGNOSIS — M0609 Rheumatoid arthritis without rheumatoid factor, multiple sites: Secondary | ICD-10-CM | POA: Diagnosis not present

## 2017-08-20 DIAGNOSIS — R5383 Other fatigue: Secondary | ICD-10-CM | POA: Diagnosis not present

## 2017-08-20 DIAGNOSIS — M7989 Other specified soft tissue disorders: Secondary | ICD-10-CM | POA: Diagnosis not present

## 2017-08-20 DIAGNOSIS — M542 Cervicalgia: Secondary | ICD-10-CM | POA: Diagnosis not present

## 2017-08-20 DIAGNOSIS — M150 Primary generalized (osteo)arthritis: Secondary | ICD-10-CM | POA: Diagnosis not present

## 2017-09-08 DIAGNOSIS — M0609 Rheumatoid arthritis without rheumatoid factor, multiple sites: Secondary | ICD-10-CM | POA: Diagnosis not present

## 2017-09-16 DIAGNOSIS — E11311 Type 2 diabetes mellitus with unspecified diabetic retinopathy with macular edema: Secondary | ICD-10-CM | POA: Diagnosis not present

## 2017-09-16 DIAGNOSIS — H524 Presbyopia: Secondary | ICD-10-CM | POA: Diagnosis not present

## 2017-09-22 DIAGNOSIS — M25562 Pain in left knee: Secondary | ICD-10-CM | POA: Diagnosis not present

## 2017-09-22 DIAGNOSIS — M7541 Impingement syndrome of right shoulder: Secondary | ICD-10-CM | POA: Diagnosis not present

## 2017-09-22 DIAGNOSIS — Z96652 Presence of left artificial knee joint: Secondary | ICD-10-CM | POA: Diagnosis not present

## 2017-09-24 ENCOUNTER — Other Ambulatory Visit: Payer: Self-pay | Admitting: Orthopaedic Surgery

## 2017-09-24 DIAGNOSIS — M25511 Pain in right shoulder: Secondary | ICD-10-CM

## 2017-09-25 DIAGNOSIS — H02423 Myogenic ptosis of bilateral eyelids: Secondary | ICD-10-CM | POA: Diagnosis not present

## 2017-09-29 ENCOUNTER — Ambulatory Visit
Admission: RE | Admit: 2017-09-29 | Discharge: 2017-09-29 | Disposition: A | Discharge status: Home / Self Care | Payer: BLUE CROSS/BLUE SHIELD | Source: Ambulatory Visit | Attending: Orthopaedic Surgery | Admitting: Orthopaedic Surgery

## 2017-09-29 DIAGNOSIS — M25511 Pain in right shoulder: Secondary | ICD-10-CM | POA: Diagnosis not present

## 2017-10-01 DIAGNOSIS — H02423 Myogenic ptosis of bilateral eyelids: Secondary | ICD-10-CM | POA: Diagnosis not present

## 2017-10-01 DIAGNOSIS — Z01818 Encounter for other preprocedural examination: Secondary | ICD-10-CM | POA: Diagnosis not present

## 2017-10-06 DIAGNOSIS — M7541 Impingement syndrome of right shoulder: Secondary | ICD-10-CM | POA: Diagnosis not present

## 2017-10-20 DIAGNOSIS — H02421 Myogenic ptosis of right eyelid: Secondary | ICD-10-CM | POA: Diagnosis not present

## 2017-10-20 DIAGNOSIS — H02423 Myogenic ptosis of bilateral eyelids: Secondary | ICD-10-CM | POA: Diagnosis not present

## 2017-10-20 DIAGNOSIS — H02422 Myogenic ptosis of left eyelid: Secondary | ICD-10-CM | POA: Diagnosis not present

## 2017-10-20 DIAGNOSIS — H02403 Unspecified ptosis of bilateral eyelids: Secondary | ICD-10-CM | POA: Diagnosis not present

## 2017-10-30 DIAGNOSIS — M75111 Incomplete rotator cuff tear or rupture of right shoulder, not specified as traumatic: Secondary | ICD-10-CM | POA: Diagnosis not present

## 2017-10-30 DIAGNOSIS — M7541 Impingement syndrome of right shoulder: Secondary | ICD-10-CM | POA: Diagnosis not present

## 2017-10-30 DIAGNOSIS — M7551 Bursitis of right shoulder: Secondary | ICD-10-CM | POA: Diagnosis not present

## 2017-10-30 DIAGNOSIS — G8918 Other acute postprocedural pain: Secondary | ICD-10-CM | POA: Diagnosis not present

## 2017-10-30 DIAGNOSIS — M75101 Unspecified rotator cuff tear or rupture of right shoulder, not specified as traumatic: Secondary | ICD-10-CM | POA: Diagnosis not present

## 2017-10-30 DIAGNOSIS — M19011 Primary osteoarthritis, right shoulder: Secondary | ICD-10-CM | POA: Diagnosis not present

## 2017-11-03 DIAGNOSIS — M069 Rheumatoid arthritis, unspecified: Secondary | ICD-10-CM | POA: Diagnosis not present

## 2017-11-03 DIAGNOSIS — E119 Type 2 diabetes mellitus without complications: Secondary | ICD-10-CM | POA: Diagnosis not present

## 2017-11-03 DIAGNOSIS — Z79899 Other long term (current) drug therapy: Secondary | ICD-10-CM | POA: Diagnosis not present

## 2017-11-10 DIAGNOSIS — Z9889 Other specified postprocedural states: Secondary | ICD-10-CM | POA: Diagnosis not present

## 2017-11-10 DIAGNOSIS — M0609 Rheumatoid arthritis without rheumatoid factor, multiple sites: Secondary | ICD-10-CM | POA: Diagnosis not present

## 2017-11-10 DIAGNOSIS — R5383 Other fatigue: Secondary | ICD-10-CM | POA: Diagnosis not present

## 2017-11-10 DIAGNOSIS — M542 Cervicalgia: Secondary | ICD-10-CM | POA: Diagnosis not present

## 2017-11-10 DIAGNOSIS — M150 Primary generalized (osteo)arthritis: Secondary | ICD-10-CM | POA: Diagnosis not present

## 2017-11-13 DIAGNOSIS — R609 Edema, unspecified: Secondary | ICD-10-CM | POA: Diagnosis not present

## 2017-11-13 DIAGNOSIS — E119 Type 2 diabetes mellitus without complications: Secondary | ICD-10-CM | POA: Diagnosis not present

## 2017-11-21 DIAGNOSIS — M0609 Rheumatoid arthritis without rheumatoid factor, multiple sites: Secondary | ICD-10-CM | POA: Diagnosis not present

## 2017-12-01 DIAGNOSIS — Z9889 Other specified postprocedural states: Secondary | ICD-10-CM | POA: Diagnosis not present

## 2017-12-05 DIAGNOSIS — M7541 Impingement syndrome of right shoulder: Secondary | ICD-10-CM | POA: Diagnosis not present

## 2017-12-08 DIAGNOSIS — M7541 Impingement syndrome of right shoulder: Secondary | ICD-10-CM | POA: Diagnosis not present

## 2017-12-11 DIAGNOSIS — M7541 Impingement syndrome of right shoulder: Secondary | ICD-10-CM | POA: Diagnosis not present

## 2017-12-12 DIAGNOSIS — M1711 Unilateral primary osteoarthritis, right knee: Secondary | ICD-10-CM | POA: Diagnosis not present

## 2017-12-15 DIAGNOSIS — M7541 Impingement syndrome of right shoulder: Secondary | ICD-10-CM | POA: Diagnosis not present

## 2017-12-19 DIAGNOSIS — M1711 Unilateral primary osteoarthritis, right knee: Secondary | ICD-10-CM | POA: Diagnosis not present

## 2017-12-19 DIAGNOSIS — M0609 Rheumatoid arthritis without rheumatoid factor, multiple sites: Secondary | ICD-10-CM | POA: Diagnosis not present

## 2017-12-19 DIAGNOSIS — Z79899 Other long term (current) drug therapy: Secondary | ICD-10-CM | POA: Diagnosis not present

## 2017-12-22 DIAGNOSIS — M7541 Impingement syndrome of right shoulder: Secondary | ICD-10-CM | POA: Diagnosis not present

## 2017-12-24 DIAGNOSIS — M1711 Unilateral primary osteoarthritis, right knee: Secondary | ICD-10-CM | POA: Diagnosis not present

## 2017-12-29 DIAGNOSIS — M1711 Unilateral primary osteoarthritis, right knee: Secondary | ICD-10-CM | POA: Diagnosis not present

## 2017-12-29 DIAGNOSIS — M7541 Impingement syndrome of right shoulder: Secondary | ICD-10-CM | POA: Diagnosis not present

## 2017-12-31 DIAGNOSIS — M7541 Impingement syndrome of right shoulder: Secondary | ICD-10-CM | POA: Diagnosis not present

## 2018-01-06 DIAGNOSIS — M7541 Impingement syndrome of right shoulder: Secondary | ICD-10-CM | POA: Diagnosis not present

## 2018-01-12 ENCOUNTER — Inpatient Hospital Stay (HOSPITAL_COMMUNITY)
Admission: EM | Admit: 2018-01-12 | Discharge: 2018-01-15 | DRG: 418 | Disposition: A | Discharge status: Home / Self Care | Payer: BLUE CROSS/BLUE SHIELD | Attending: Internal Medicine | Admitting: Internal Medicine

## 2018-01-12 ENCOUNTER — Other Ambulatory Visit: Payer: Self-pay

## 2018-01-12 ENCOUNTER — Encounter (HOSPITAL_COMMUNITY): Payer: Self-pay | Admitting: Emergency Medicine

## 2018-01-12 ENCOUNTER — Inpatient Hospital Stay (HOSPITAL_COMMUNITY): Payer: BLUE CROSS/BLUE SHIELD

## 2018-01-12 ENCOUNTER — Emergency Department (HOSPITAL_COMMUNITY): Payer: BLUE CROSS/BLUE SHIELD

## 2018-01-12 DIAGNOSIS — E119 Type 2 diabetes mellitus without complications: Secondary | ICD-10-CM | POA: Diagnosis present

## 2018-01-12 DIAGNOSIS — J9811 Atelectasis: Secondary | ICD-10-CM | POA: Diagnosis not present

## 2018-01-12 DIAGNOSIS — R0600 Dyspnea, unspecified: Secondary | ICD-10-CM

## 2018-01-12 DIAGNOSIS — E039 Hypothyroidism, unspecified: Secondary | ICD-10-CM | POA: Diagnosis not present

## 2018-01-12 DIAGNOSIS — K810 Acute cholecystitis: Principal | ICD-10-CM | POA: Diagnosis present

## 2018-01-12 DIAGNOSIS — I10 Essential (primary) hypertension: Secondary | ICD-10-CM | POA: Diagnosis present

## 2018-01-12 DIAGNOSIS — F419 Anxiety disorder, unspecified: Secondary | ICD-10-CM | POA: Diagnosis present

## 2018-01-12 DIAGNOSIS — R319 Hematuria, unspecified: Secondary | ICD-10-CM | POA: Diagnosis not present

## 2018-01-12 DIAGNOSIS — Z96659 Presence of unspecified artificial knee joint: Secondary | ICD-10-CM | POA: Diagnosis not present

## 2018-01-12 DIAGNOSIS — Z79890 Hormone replacement therapy (postmenopausal): Secondary | ICD-10-CM | POA: Diagnosis not present

## 2018-01-12 DIAGNOSIS — K819 Cholecystitis, unspecified: Secondary | ICD-10-CM | POA: Diagnosis not present

## 2018-01-12 DIAGNOSIS — Z79899 Other long term (current) drug therapy: Secondary | ICD-10-CM | POA: Diagnosis not present

## 2018-01-12 DIAGNOSIS — E782 Mixed hyperlipidemia: Secondary | ICD-10-CM | POA: Diagnosis not present

## 2018-01-12 DIAGNOSIS — K821 Hydrops of gallbladder: Secondary | ICD-10-CM | POA: Diagnosis not present

## 2018-01-12 DIAGNOSIS — R1011 Right upper quadrant pain: Secondary | ICD-10-CM | POA: Diagnosis not present

## 2018-01-12 DIAGNOSIS — E785 Hyperlipidemia, unspecified: Secondary | ICD-10-CM | POA: Diagnosis not present

## 2018-01-12 DIAGNOSIS — M6208 Separation of muscle (nontraumatic), other site: Secondary | ICD-10-CM

## 2018-01-12 DIAGNOSIS — Z7984 Long term (current) use of oral hypoglycemic drugs: Secondary | ICD-10-CM

## 2018-01-12 DIAGNOSIS — Z6834 Body mass index (BMI) 34.0-34.9, adult: Secondary | ICD-10-CM | POA: Diagnosis not present

## 2018-01-12 DIAGNOSIS — F411 Generalized anxiety disorder: Secondary | ICD-10-CM | POA: Diagnosis present

## 2018-01-12 DIAGNOSIS — K5900 Constipation, unspecified: Secondary | ICD-10-CM | POA: Diagnosis present

## 2018-01-12 DIAGNOSIS — N390 Urinary tract infection, site not specified: Secondary | ICD-10-CM | POA: Diagnosis present

## 2018-01-12 DIAGNOSIS — R112 Nausea with vomiting, unspecified: Secondary | ICD-10-CM | POA: Diagnosis not present

## 2018-01-12 DIAGNOSIS — K812 Acute cholecystitis with chronic cholecystitis: Secondary | ICD-10-CM | POA: Diagnosis not present

## 2018-01-12 DIAGNOSIS — R109 Unspecified abdominal pain: Secondary | ICD-10-CM | POA: Diagnosis not present

## 2018-01-12 DIAGNOSIS — K760 Fatty (change of) liver, not elsewhere classified: Secondary | ICD-10-CM | POA: Diagnosis not present

## 2018-01-12 HISTORY — DX: Type 2 diabetes mellitus without complications: E11.9

## 2018-01-12 HISTORY — DX: Hyperlipidemia, unspecified: E78.5

## 2018-01-12 HISTORY — DX: Anxiety disorder, unspecified: F41.9

## 2018-01-12 HISTORY — DX: Disorder of thyroid, unspecified: E07.9

## 2018-01-12 LAB — COMPREHENSIVE METABOLIC PANEL
ALT: 20 U/L (ref 0–44)
AST: 19 U/L (ref 15–41)
Albumin: 4.6 g/dL (ref 3.5–5.0)
Alkaline Phosphatase: 69 U/L (ref 38–126)
Anion gap: 12 (ref 5–15)
BUN: 14 mg/dL (ref 8–23)
CO2: 29 mmol/L (ref 22–32)
Calcium: 9.5 mg/dL (ref 8.9–10.3)
Chloride: 98 mmol/L (ref 98–111)
Creatinine, Ser: 0.76 mg/dL (ref 0.44–1.00)
GFR calc Af Amer: >60 mL/min (ref >60)
GFR calc non Af Amer: >60 mL/min (ref >60)
Glucose, Bld: 144 mg/dL — ABNORMAL HIGH (ref 70–99)
Potassium: 3.9 mmol/L (ref 3.5–5.1)
Sodium: 139 mmol/L (ref 135–145)
Total Bilirubin: 1.3 mg/dL — ABNORMAL HIGH (ref 0.3–1.2)
Total Protein: 7.5 g/dL (ref 6.5–8.1)

## 2018-01-12 LAB — URINALYSIS, ROUTINE W REFLEX MICROSCOPIC
Bilirubin Urine: NEGATIVE
Glucose, UA: NEGATIVE mg/dL
Hgb urine dipstick: NEGATIVE
Ketones, ur: NEGATIVE mg/dL
Nitrite: NEGATIVE
Protein, ur: 30 mg/dL — AB
Specific Gravity, Urine: 1.014 (ref 1.005–1.030)
WBC, UA: >50 WBC/hpf — ABNORMAL HIGH (ref 0–5)
pH: 5 (ref 5.0–8.0)

## 2018-01-12 LAB — CBC
HCT: 46 % (ref 36.0–46.0)
Hemoglobin: 15.5 g/dL — ABNORMAL HIGH (ref 12.0–15.0)
MCH: 31.6 pg (ref 26.0–34.0)
MCHC: 33.7 g/dL (ref 30.0–36.0)
MCV: 93.9 fL (ref 78.0–100.0)
Platelets: 160 10*3/uL (ref 150–400)
RBC: 4.9 MIL/uL (ref 3.87–5.11)
RDW: 12.4 % (ref 11.5–15.5)
WBC: 18.8 10*3/uL — ABNORMAL HIGH (ref 4.0–10.5)

## 2018-01-12 LAB — GLUCOSE, CAPILLARY: Glucose-Capillary: 149 mg/dL — ABNORMAL HIGH (ref 70–99)

## 2018-01-12 LAB — LIPASE, BLOOD: Lipase: 28 U/L (ref 11–51)

## 2018-01-12 MED ORDER — PIPERACILLIN-TAZOBACTAM 3.375 G IVPB 30 MIN
3.3750 g | Freq: Once | INTRAVENOUS | Status: AC
  Administered 2018-01-12: 3.375 g via INTRAVENOUS
  Filled 2018-01-12: qty 50

## 2018-01-12 MED ORDER — LEVOTHYROXINE SODIUM 75 MCG PO TABS
75.0000 ug | ORAL_TABLET | Freq: Every day | ORAL | Status: DC
  Administered 2018-01-13 – 2018-01-15 (×3): 75 ug via ORAL
  Filled 2018-01-12 (×3): qty 1

## 2018-01-12 MED ORDER — ONDANSETRON HCL 4 MG PO TABS
4.0000 mg | ORAL_TABLET | Freq: Four times a day (QID) | ORAL | Status: DC | PRN
  Administered 2018-01-15: 4 mg via ORAL
  Filled 2018-01-12: qty 1

## 2018-01-12 MED ORDER — DIPHENHYDRAMINE HCL 25 MG PO CAPS: 25.0000 mg | ORAL_CAPSULE | Freq: Every evening | ORAL | Status: DC | PRN

## 2018-01-12 MED ORDER — SODIUM CHLORIDE 0.9 % IV SOLN
INTRAVENOUS | Status: DC
  Administered 2018-01-12 – 2018-01-15 (×3): via INTRAVENOUS

## 2018-01-12 MED ORDER — HYDROMORPHONE HCL 1 MG/ML IJ SOLN
1.0000 mg | Freq: Once | INTRAMUSCULAR | Status: AC
  Administered 2018-01-12: 1 mg via INTRAVENOUS
  Filled 2018-01-12: qty 1

## 2018-01-12 MED ORDER — HYDROMORPHONE HCL 1 MG/ML IJ SOLN: 1.0000 mg | Freq: Once | INTRAMUSCULAR | Status: DC | PRN

## 2018-01-12 MED ORDER — FAMOTIDINE IN NACL 20-0.9 MG/50ML-% IV SOLN
20.0000 mg | Freq: Two times a day (BID) | INTRAVENOUS | Status: DC
  Administered 2018-01-12 – 2018-01-15 (×6): 20 mg via INTRAVENOUS
  Filled 2018-01-12 (×5): qty 50

## 2018-01-12 MED ORDER — SPIRONOLACTONE 25 MG PO TABS: 25.0000 mg | ORAL_TABLET | Freq: Every day | ORAL | Status: DC

## 2018-01-12 MED ORDER — SODIUM CHLORIDE 0.9 % IV SOLN
INTRAVENOUS | Status: DC
  Administered 2018-01-12: 15:00:00 via INTRAVENOUS

## 2018-01-12 MED ORDER — PROMETHAZINE HCL 12.5 MG PO TABS
12.5000 mg | ORAL_TABLET | ORAL | Status: DC | PRN
  Filled 2018-01-12: qty 1

## 2018-01-12 MED ORDER — KETOROLAC TROMETHAMINE 30 MG/ML IJ SOLN
30.0000 mg | Freq: Once | INTRAMUSCULAR | Status: AC
  Administered 2018-01-12: 30 mg via INTRAVENOUS
  Filled 2018-01-12: qty 1

## 2018-01-12 MED ORDER — ONDANSETRON HCL 4 MG/2ML IJ SOLN
4.0000 mg | Freq: Once | INTRAMUSCULAR | Status: AC
  Administered 2018-01-12: 4 mg via INTRAVENOUS
  Filled 2018-01-12: qty 2

## 2018-01-12 MED ORDER — ACETAMINOPHEN 325 MG PO TABS
325.0000 mg | ORAL_TABLET | Freq: Every evening | ORAL | Status: DC | PRN
  Administered 2018-01-12 – 2018-01-13 (×2): 325 mg via ORAL
  Filled 2018-01-12 (×2): qty 1

## 2018-01-12 MED ORDER — ONDANSETRON HCL 4 MG/2ML IJ SOLN
4.0000 mg | Freq: Four times a day (QID) | INTRAMUSCULAR | Status: DC | PRN
  Administered 2018-01-13 – 2018-01-14 (×3): 4 mg via INTRAVENOUS
  Filled 2018-01-12 (×5): qty 2

## 2018-01-12 MED ORDER — IOPAMIDOL (ISOVUE-300) INJECTION 61%
100.0000 mL | Freq: Once | INTRAVENOUS | Status: AC | PRN
  Administered 2018-01-12: 100 mL via INTRAVENOUS

## 2018-01-12 MED ORDER — PIPERACILLIN-TAZOBACTAM 3.375 G IVPB
3.3750 g | Freq: Three times a day (TID) | INTRAVENOUS | Status: DC
  Administered 2018-01-13 – 2018-01-15 (×7): 3.375 g via INTRAVENOUS
  Filled 2018-01-12 (×14): qty 50

## 2018-01-12 MED ORDER — DIPHENHYDRAMINE HCL 25 MG PO CAPS
25.0000 mg | ORAL_CAPSULE | Freq: Every evening | ORAL | Status: DC | PRN
  Administered 2018-01-12 – 2018-01-13 (×2): 25 mg via ORAL
  Filled 2018-01-12 (×2): qty 1

## 2018-01-12 MED ORDER — HYDROMORPHONE HCL 1 MG/ML IJ SOLN
1.0000 mg | INTRAMUSCULAR | Status: DC | PRN
  Administered 2018-01-14 – 2018-01-15 (×3): 1 mg via INTRAVENOUS
  Filled 2018-01-12 (×5): qty 1

## 2018-01-12 MED ORDER — SERTRALINE HCL 50 MG PO TABS
50.0000 mg | ORAL_TABLET | Freq: Every day | ORAL | Status: DC
  Administered 2018-01-13 – 2018-01-15 (×3): 50 mg via ORAL
  Filled 2018-01-12 (×3): qty 1

## 2018-01-12 MED ORDER — KETOROLAC TROMETHAMINE 30 MG/ML IJ SOLN: 30.0000 mg | Freq: Once | INTRAMUSCULAR | Status: DC

## 2018-01-12 MED ORDER — SERTRALINE HCL 50 MG PO TABS: 25.0000 mg | ORAL_TABLET | Freq: Every day | ORAL | Status: DC

## 2018-01-12 MED ORDER — SODIUM CHLORIDE 0.9 % IV SOLN: INTRAVENOUS | Status: DC

## 2018-01-12 NOTE — H&P (Signed)
History and Physical    Monique Weiss MWN:[id number] DOB: 01/07/1956 DOA: 01/12/2018  PCP: Asencion Noble, MD   Patient coming from: Home/PCPs office.  I have personally briefly reviewed patient's old medical records in [place]  Chief Complaint: Right-sided abdominal pain.  HPI: Monique Weiss is a 62 y.o. female with medical history significant of satiety, type 2 diabetes, hyperlipidemia, hypothyroidism who is coming to the emergency department referred by her PCP (Dr. Willey Blade) due to right-sided abdominal pain that has been getting progressively worse since Friday.  The patient denies vomiting, but states that she has had frequent nausea, decreased appetite, multiple episodes of diarrhea since Saturday, but particularly yesterday, the patient states that she had over 10 loose stools BMs. She states that the diarrhea has resolved today. No constipation, melena or hematochezia.  She also has experienced some mild dysuria, but denies frequency, hematuria or oliguria.  She denies fever, chills, sore throat, dyspnea, chest pain, palpitations, dizziness, diaphoresis, PND, orthopnea or pitting edema of the lower extremities.  No polyuria, polydipsia, polyphagia or blurred vision.  Denies heat or cold intolerance.  Denies pruritus or skin rashes.  ED Course: Initial vital signs in the emergency department temperature 97.9 F, pulse 108, respirations 16, blood pressure 110/71 mmHg and O2 sat 95% on room air.  She received 1 mg of hydromorphone IV x1, 4 mg of Zofran IV x1 and Zosyn IVPB.  She says she has had a partial relief with this.  She was also seen by general surgery (Dr. Constance Haw).  Urinalysis showed large leukocyte esterase, 11-20 RBC and more than 50 WBC, few bacteria per hpf.  Urine culture and sensitivity was collected in the ED.  White count was 18.8, hemoglobin 15.5 g/dL and platelets 160.  Her CMP was normal, except for a glucose of 144 and total bilirubin of 1.3 mg/dL.  Lipase was 28  units/L.  Imaging: CT abdomen showed acute cholecystitis lingular atelectasis and aortic atherosclerosis.  Ultrasound of the gallbladder showed distended gallbladder with bowel cholelithiasis or wall thickening.  There was a positive sonographic Percell Miller sign reported by the sonography tech.  Please see images and full radiology reports for further detail.  Review of Systems: As per HPI otherwise 10 point review of systems negative.   Past Medical History:  Diagnosis Date  . Anxiety   . Diabetes mellitus without complication ([place])   . Hyperlipidemia   . Thyroid disease    Hypo    Past Surgical History:  Procedure Laterality Date  . REPLACEMENT TOTAL KNEE    . SHOULDER SURGERY       reports that she has never smoked. She has never used smokeless tobacco. She reports that she drank alcohol. She reports that she does not use drugs.  No Known Allergies  Family History  Problem Relation Age of Onset  . Heart disease Mother   . Diabetes Mother   . Hypertension Mother   . Heart attack Father   . Prostate cancer Father   . Heart disease Sister   . Hypertension Sister   . Diabetes Sister   . Heart attack Brother   . Heart disease Brother   . Hypertension Brother   . Diabetes Brother     Prior to Admission medications   Medication Sig Start Date End Date Taking? Authorizing Provider  atorvastatin (LIPITOR) 40 MG tablet TK 1 T PO HS 10/22/17  Yes [provider]  cyclobenzaprine (FLEXERIL) 10 MG tablet cyclobenzaprine 10 mg tablet  Yes [provider]  furosemide (LASIX) 40 MG tablet Take 80 mg by mouth daily.   Yes [provider]  levothyroxine (SYNTHROID, LEVOTHROID) 75 MCG tablet TK 1 T  PO QD 12/29/17  Yes [provider]  metFORMIN (GLUCOPHAGE) 1000 MG tablet TK 1 T PO BID 12/18/17  Yes [provider]  promethazine (PHENERGAN) 12.5 MG tablet TAKE 1 OR 2 TABLETS PO Q 6 H PRN NV 01/02/18  Yes [provider]  sertraline  (ZOLOFT) 50 MG tablet TK 1/2 T PO QD FOR 6 DAYS THEN TK 1 T PO DAILY 12/11/17  Yes [provider]  spironolactone (ALDACTONE) 25 MG tablet spironolactone 25 mg tablet   Yes [provider]  tiZANidine (ZANAFLEX) 2 MG tablet TK 1 T PO BID PRF SPASM 12/30/17  Yes [provider]  acetaminophen-codeine (TYLENOL #3) 300-30 MG tablet Take 1 tablet by mouth at bedtime. 01/02/18   [provider]  HYDROcodone-acetaminophen (NORCO/VICODIN) 5-325 MG tablet TK 1 T PO BID PRN FOR PAIN 12/29/17   [provider]  Tofacitinib Citrate (XELJANZ XR) 11 MG TB24 Xeljanz XR 11 mg tablet,extended release    [provider]    Physical Exam: Vitals:   01/12/18 1920 01/12/18 1922 01/12/18 1925 01/12/18 2025  BP:  109/76  110/75  Pulse:   93 83  Resp: 18   18  Temp:    98.4 F (36.9 C)  TempSrc:    Oral  SpO2:   91% 96%  Weight:      Height:        Constitutional: NAD, calm, comfortable Eyes: PERRL, lids and conjunctivae normal ENMT: Mucous membranes and lips are dry. Posterior pharynx clear of any exudate or lesions. Neck: normal, supple, no masses, no thyromegaly Respiratory: Decreased breath sounds on bases, otherwise clear to auscultation bilaterally, no wheezing, no crackles. Normal respiratory effort. No accessory muscle use.  Cardiovascular: Regular rate and rhythm, no murmurs / rubs / gallops. No extremity edema. 2+ pedal pulses. No carotid bruits.  Abdomen: Distended. BS positive. Epigastric, RUQ and RLQ  tenderness, no guarding/rebound/masses palpated. No hepatosplenomegaly. Bowel sounds positive.  Musculoskeletal: no clubbing / cyanosis.  Good ROM, no contractures. Normal muscle tone.  Skin: no rashes, lesions, ulcers exam. Neurologic: CN 2-12 grossly intact. Sensation intact, DTR normal. Strength 5/5 in all 4.  Psychiatric: Normal judgment and insight. Alert and oriented x 3. Normal mood.    Labs on Admission: I have personally reviewed  following labs and imaging studies  CBC: Recent Labs  Lab 01/12/18 1255  WBC 18.8*  HGB 15.5*  HCT 46.0  MCV 93.9  PLT 191   Basic Metabolic Panel: Recent Labs  Lab 01/12/18 1255  NA 139  K 3.9  CL 98  CO2 29  GLUCOSE 144*  BUN 14  CREATININE 0.76  CALCIUM 9.5   GFR: Estimated Creatinine Clearance: 94.5 mL/min (by C-G formula based on SCr of 0.76 mg/dL). Liver Function Tests: Recent Labs  Lab 01/12/18 1255  AST 19  ALT 20  ALKPHOS 69  BILITOT 1.3*  PROT 7.5  ALBUMIN 4.6   Recent Labs  Lab 01/12/18 1255  LIPASE 28   No results for input(s): AMMONIA in the last 168 hours. Coagulation Profile: No results for input(s): INR, PROTIME in the last 168 hours. Cardiac Enzymes: No results for input(s): CKTOTAL, CKMB, CKMBINDEX, TROPONINI in the last 168 hours. BNP (last 3 results) No results for input(s): PROBNP in the last 8760 hours. HbA1C: No  results for input(s): HGBA1C in the last 72 hours. CBG: No results for input(s): GLUCAP in the last 168 hours. Lipid Profile: No results for input(s): CHOL, HDL, LDLCALC, TRIG, CHOLHDL, LDLDIRECT in the last 72 hours. Thyroid Function Tests: No results for input(s): TSH, T4TOTAL, FREET4, T3FREE, THYROIDAB in the last 72 hours. Anemia Panel: No results for input(s): VITAMINB12, FOLATE, FERRITIN, TIBC, IRON, RETICCTPCT in the last 72 hours. Urine analysis:    Component Value Date/Time   COLORURINE AMBER (A) 01/12/2018 1217   APPEARANCEUR CLOUDY (A) 01/12/2018 1217   LABSPEC 1.014 01/12/2018 1217   PHURINE 5.0 01/12/2018 1217   GLUCOSEU NEGATIVE 01/12/2018 1217   HGBUR NEGATIVE 01/12/2018 1217   BILIRUBINUR NEGATIVE 01/12/2018 1217   KETONESUR NEGATIVE 01/12/2018 1217   PROTEINUR 30 (A) 01/12/2018 1217   NITRITE NEGATIVE 01/12/2018 1217   LEUKOCYTESUR LARGE (A) 01/12/2018 1217    Radiological Exams on Admission: Ct Abdomen Pelvis W Contrast  Result Date: 01/12/2018 CLINICAL DATA:  Abdominal pain worse with  palpation. EXAM: CT ABDOMEN AND PELVIS WITH CONTRAST TECHNIQUE: Multidetector CT imaging of the abdomen and pelvis was performed using the standard protocol following bolus administration of intravenous contrast. CONTRAST:  156mL ISOVUE-300 IOPAMIDOL (ISOVUE-300) INJECTION 61% COMPARISON:  Abdominal ultrasound January 12, 2018 and CT abdomen and pelvis March 19, 2010. FINDINGS: LOWER CHEST: Bibasilar atelectasis. Heart is mildly enlarged. Trace pericardial effusion. Enhancing wedge-like consolidation lingula. HEPATOBILIARY: Distended gallbladder with wall thickening, pericholecystic effusion and fat stranding. Normal liver. PANCREAS: Normal.  Punctate peripancreatic calcification. SPLEEN: Normal. ADRENALS/URINARY TRACT: Kidneys are orthotopic, demonstrating symmetric enhancement. No nephrolithiasis, hydronephrosis or solid renal masses. The unopacified ureters are normal in course and caliber. Delayed imaging through the kidneys demonstrates symmetric prompt contrast excretion within the proximal urinary collecting system. Urinary bladder is well distended and unremarkable. Normal adrenal glands. STOMACH/BOWEL: The stomach, small and large bowel are normal in course and caliber without inflammatory changes, sensitivity decreased without oral contrast. Moderate descending and sigmoid colonic diverticulosis. The appendix is not discretely identified, however there are no inflammatory changes in the right lower quadrant. VASCULAR/LYMPHATIC: Aortoiliac vessels are normal in course and caliber. Mild calcific atherosclerosis. No lymphadenopathy by CT size criteria. REPRODUCTIVE: Normal. OTHER: No intraperitoneal free fluid or free air. MUSCULOSKELETAL: Nonacute. Anterior abdominal wall ligamentous laxity. Small fat containing umbilical hernia, suspected ventral herniorrhaphy. Sacralized LEFT L5 vertebral body. L2-3 through L5-S1 interbody disc prosthesis with arthrodesis and laminectomies. IMPRESSION: 1. Acute  cholecystitis. 2. Lingular atelectasis, less likely pneumonia. Aortic Atherosclerosis (ICD10-I70.0). Electronically Signed   By: Elon Alas M.D.   On: 01/12/2018 17:01   US Abdomen Limited  Result Date: 01/12/2018 CLINICAL DATA:  Right upper quadrant pain EXAM: ULTRASOUND ABDOMEN LIMITED RIGHT UPPER QUADRANT COMPARISON:  None. FINDINGS: Gallbladder: Distended gallbladder. No cholelithiasis. No wall thickening or pericholecystic fluid. A positive sonographic Percell Miller sign was reported by the sonographer. Common bile duct: Diameter: 4 mm Liver: Increased echogenicity of the hepatic parenchyma. Visualization otherwise limited by body habitus and bowel gas. Portal vein is patent on color Doppler imaging with normal direction of blood flow towards the liver. IMPRESSION: 1. Distended gallbladder and positive sonographic Murphy sign, indeterminate in the absence of cholelithiasis. CT of the abdomen and pelvis might be helpful for further characterization. 2. Hepatic steatosis. 3. No biliary dilatation. Electronically Signed   By: Ulyses Jarred M.D.   On: 01/12/2018 15:43    EKG: Independently reviewed.  Vent. rate 83 BPM PR interval * ms QRS duration 103 ms QT/QTc 388/456 ms P-R-T  axes 29 -52 68 Vent. rate 83 BPM PR interval * ms QRS duration 103 ms QT/QTc 388/456 ms P-R-T axes 29 -52 68  Assessment/Plan Principal Problem:   Acute cholecystitis Admit to MedSurg/inpatient. Clear liquid diet only. Continue IV hydration. Monitor intake and output. May need to stop IV hydration, once hydrated, since the patient is on diuretics. Continue Zosyn every 8 hours. Analgesics and antiemetics as needed.  Active Problems:   UTI (urinary tract infection) Continue Zosyn 3.375 g every 8 hours IVPB. Follow-up urine culture and sensitivity.    Hyperlipidemia Hold statin for now. Monitor LFTs.    Anxiety Continue sertraline 50 mg p.o. Daily.    Atelectasis of the lingula Incentive spirometry  when awake.   DVT prophylaxis: SCDs. Code Status: Full code. Family Communication: Disposition Plan: Admit for IV antibiotic therapy for 2 to 3 days. Consults called: General surgery has seen the patient (Dr. Constance Haw). Admission status: Inpatient/MedSurg.   Reubin Milan MD [facility] Pager [phone].  If 7PM-7AM, please contact night-coverage [email or web address] Password [facility]  01/12/2018, 10:09 PM   This document was prepared using Dragon voice recognition software and may contain some unintended transcription errors.

## 2018-01-12 NOTE — Consult Note (Signed)
[facility] Consult  Reason for Consult: Acute Cholecystitis  Referring Physician:  Dr. Wilson Singer  Chief Complaint    Abdominal Pain      Monique Weiss is a 62 y.o. female.  HPI: Monique Weiss is a 62 yo with a history of DM, hypothyroidism who developed onset of abdominal pain on Friday that has been getting progressively worse. The pain is the the RUQ quadrant/ epigastric area.  She says that she has had some nausea Weiss bloating Weiss has not wanted to eat. She had some diarrhea, but no fevers or chills.  She went to her PCP, Dr. Willey Blade, Weiss he recommended that she go to the ED for evaluation.   She reports never having any episodes of pain like this before Weiss not having any episodes of pain with eating.  She is suppose to have a right knee surgery next week.  She has some baseline constipation Weiss has not had a BM since she had diarrhea over the weekend.   Past Medical History:  Diagnosis Date  . Diabetes mellitus without complication ([place])   . Thyroid disease    Hypo    Past Surgical History:  Procedure Laterality Date  . REPLACEMENT TOTAL KNEE    . SHOULDER SURGERY      History reviewed. No pertinent family history.  Social History   Tobacco Use  . Smoking status: Never Smoker  . Smokeless tobacco: Never Used  Substance Use Topics  . Alcohol use: Not Currently  . Drug use: Never    Medications:  I have reviewed the patient's current medications. Prior to Admission:  Medications Prior to Admission  Medication Sig Dispense Refill Last Dose  . atorvastatin (LIPITOR) 40 MG tablet TK 1 T PO HS  11 01/11/2018 at Unknown time  . cyclobenzaprine (FLEXERIL) 10 MG tablet cyclobenzaprine 10 mg tablet   Past Week at Unknown time  . furosemide (LASIX) 40 MG tablet Take 80 mg by mouth daily.   01/12/2018 at Unknown time  . levothyroxine (SYNTHROID, LEVOTHROID) 75 MCG tablet TK 1 T  PO QD  12 01/12/2018 at Unknown time  . metFORMIN (GLUCOPHAGE) 1000 MG tablet TK 1 T PO  BID  3 01/12/2018 at Unknown time  . promethazine (PHENERGAN) 12.5 MG tablet TAKE 1 OR 2 TABLETS PO Q 6 H PRN NV  0 01/11/2018 at Unknown time  . sertraline (ZOLOFT) 50 MG tablet TK 1/2 T PO QD FOR 6 DAYS THEN TK 1 T PO DAILY  6 01/12/2018 at Unknown time  . spironolactone (ALDACTONE) 25 MG tablet spironolactone 25 mg tablet   01/12/2018 at Unknown time  . tiZANidine (ZANAFLEX) 2 MG tablet TK 1 T PO BID PRF SPASM  2 Past Week at Unknown time  . acetaminophen-codeine (TYLENOL #3) 300-30 MG tablet Take 1 tablet by mouth at bedtime.  0 unknown  . HYDROcodone-acetaminophen (NORCO/VICODIN) 5-325 MG tablet TK 1 T PO BID PRN FOR PAIN  0 unknown  . Tofacitinib Citrate (XELJANZ XR) 11 MG TB24 Xeljanz XR 11 mg tablet,extended release   12/19/2017   Scheduled: . insulin aspart  0-9 Units Subcutaneous TID WC  . levothyroxine  75 mcg Oral QAC breakfast  . sertraline  50 mg Oral Daily   Continuous: . sodium chloride 100 mL/hr at 01/12/18 2158  . famotidine (PEPCID) IV Stopped (01/13/18 1316)  . piperacillin-tazobactam Stopped (01/13/18 1236)   Monique **Weiss** acetaminophen, HYDROmorphone (DILAUDID) injection, HYDROmorphone (DILAUDID) injection, ondansetron **OR** ondansetron (ZOFRAN) IV, promethazine  No Known  Allergies   ROS:  A comprehensive review of systems was negative except for: Gastrointestinal: positive for abdominal pain, constipation, nausea Weiss bloating  Blood pressure 110/71, pulse (!) 108, temperature 97.9 F (36.6 C), temperature source Temporal, resp. rate 16, height '5\' 10"'  (1.778 m), weight 238 lb (108 kg), SpO2 95 %. Physical Exam  Constitutional: She is oriented to person, place, Weiss time. She appears well-developed Weiss well-nourished.  HENT:  Head: Normocephalic.  Eyes: Pupils are equal, round, Weiss reactive to light.  Cardiovascular: Normal rate Weiss regular rhythm.  Pulmonary/Chest: Effort normal Weiss breath sounds normal.  Abdominal: Soft. Normal appearance. She  exhibits no distension. There is tenderness in the right upper quadrant.  Diastasis of the abdomen  Musculoskeletal:  No edema  Neurological: She is alert Weiss oriented to person, place, Weiss time.  Skin: Skin is warm Weiss dry.  Psychiatric: She has a normal mood Weiss affect. Her behavior is normal.  Vitals reviewed.   Results: Results for orders placed or performed during the hospital encounter of 01/12/18 (from the past 48 hour(s))  Urinalysis, Routine w reflex microscopic     Status: Abnormal   Collection Time: 01/12/18 12:17 PM  Result Value Ref Range   Color, Urine AMBER (A) YELLOW    Comment: BIOCHEMICALS MAY BE AFFECTED BY COLOR   APPearance CLOUDY (A) CLEAR   Specific Gravity, Urine 1.014 1.005 - 1.030   pH 5.0 5.0 - 8.0   Glucose, UA NEGATIVE NEGATIVE mg/dL   Hgb urine dipstick NEGATIVE NEGATIVE   Bilirubin Urine NEGATIVE NEGATIVE   Ketones, ur NEGATIVE NEGATIVE mg/dL   Protein, ur 30 (A) NEGATIVE mg/dL   Nitrite NEGATIVE NEGATIVE   Leukocytes, UA LARGE (A) NEGATIVE   RBC / HPF 11-20 0 - 5 RBC/hpf   WBC, UA >50 (H) 0 - 5 WBC/hpf   Bacteria, UA FEW (A) NONE SEEN   Squamous Epithelial / LPF 21-50 0 - 5   WBC Clumps PRESENT    Mucus PRESENT    Hyaline Casts, UA PRESENT    Non Squamous Epithelial 0-5 (A) NONE SEEN    Comment: Performed at [facility], [address] [place], [place] [address]  Lipase, blood     Status: None   Collection Time: 01/12/18 12:55 PM  Result Value Ref Range   Lipase 28 11 - 51 U/L    Comment: Performed at [facility], [address], [place], [place] [address]  Comprehensive metabolic panel     Status: Abnormal   Collection Time: 01/12/18 12:55 PM  Result Value Ref Range   Sodium 139 135 - 145 mmol/L   Potassium 3.9 3.5 - 5.1 mmol/L   Chloride 98 98 - 111 mmol/L   CO2 29 22 - 32 mmol/L   Glucose, Bld 144 (H) 70 - 99 mg/dL   BUN 14 8 - 23 mg/dL   Creatinine, Ser 0.76 0.44 - 1.00 mg/dL   Calcium 9.5 8.9 - 10.3 mg/dL   Total Protein 7.5  6.5 - 8.1 g/dL   Albumin 4.6 3.5 - 5.0 g/dL   AST 19 15 - 41 U/L   ALT 20 0 - 44 U/L   Alkaline Phosphatase 69 38 - 126 U/L   Total Bilirubin 1.3 (H) 0.3 - 1.2 mg/dL   GFR calc non Af Amer >60 >60 mL/min   GFR calc Af Amer >60 >60 mL/min    Comment: (NOTE) The eGFR has been calculated using the CKD EPI equation. This calculation has not been validated  in all clinical situations. eGFR's persistently <60 mL/min signify possible Chronic Kidney Disease.    Anion gap 12 5 - 15    Comment: Performed at [facility], [address] [place], [place] [address]  CBC     Status: Abnormal   Collection Time: 01/12/18 12:55 PM  Result Value Ref Range   WBC 18.8 (H) 4.0 - 10.5 K/uL   RBC 4.90 3.87 - 5.11 MIL/uL   Hemoglobin 15.5 (H) 12.0 - 15.0 g/dL   HCT 46.0 36.0 - 46.0 %   MCV 93.9 78.0 - 100.0 fL   MCH 31.6 26.0 - 34.0 pg   MCHC 33.7 30.0 - 36.0 g/dL   RDW 12.4 11.5 - 15.5 %   Platelets 160 150 - 400 K/uL    Comment: Performed at [facility], [address], [place], [place] [address]   Personally reviewed- gallbladder distended with fluid around the gallbladder, diastasis of the abdominal wall in the epigastric area, possible small fat containing hernia within the diastasis at the level of the umbilicus; CBD 87m on [place] Ct Abdomen Pelvis W Contrast  Result Date: 01/12/2018 CLINICAL DATA:  Abdominal pain worse with palpation. EXAM: CT ABDOMEN Weiss PELVIS WITH CONTRAST TECHNIQUE: Multidetector CT imaging of the abdomen Weiss pelvis was performed using the standard protocol following bolus administration of intravenous contrast. CONTRAST:  10109mISOVUE-300 IOPAMIDOL (ISOVUE-300) INJECTION 61% COMPARISON:  Abdominal ultrasound January 12, 2018 Weiss CT abdomen Weiss pelvis March 19, 2010. FINDINGS: LOWER CHEST: Bibasilar atelectasis. Heart is mildly enlarged. Trace pericardial effusion. Enhancing wedge-like consolidation lingula. HEPATOBILIARY: Distended gallbladder with wall thickening, pericholecystic  effusion Weiss fat stranding. Normal liver. PANCREAS: Normal.  Punctate peripancreatic calcification. SPLEEN: Normal. ADRENALS/URINARY TRACT: Kidneys are orthotopic, demonstrating symmetric enhancement. No nephrolithiasis, hydronephrosis or solid renal masses. The unopacified ureters are normal in course Weiss caliber. Delayed imaging through the kidneys demonstrates symmetric prompt contrast excretion within the proximal urinary collecting system. Urinary bladder is well distended Weiss unremarkable. Normal adrenal glands. STOMACH/BOWEL: The stomach, small Weiss large bowel are normal in course Weiss caliber without inflammatory changes, sensitivity decreased without oral contrast. Moderate descending Weiss sigmoid colonic diverticulosis. The appendix is not discretely identified, however there are no inflammatory changes in the right lower quadrant. VASCULAR/LYMPHATIC: Aortoiliac vessels are normal in course Weiss caliber. Mild calcific atherosclerosis. No lymphadenopathy by CT size criteria. REPRODUCTIVE: Normal. OTHER: No intraperitoneal free fluid or free air. MUSCULOSKELETAL: Nonacute. Anterior abdominal wall ligamentous laxity. Small fat containing umbilical hernia, suspected ventral herniorrhaphy. Sacralized LEFT L5 vertebral body. L2-3 through L5-S1 interbody disc prosthesis with arthrodesis Weiss laminectomies. IMPRESSION: 1. Acute cholecystitis. 2. Lingular atelectasis, less likely pneumonia. Aortic Atherosclerosis (ICD10-I70.0). Electronically Signed   By: CoElon Alas.D.   On: 01/12/2018 17:01   UsKoreabdomen Limited  Result Date: 01/12/2018 CLINICAL DATA:  Right upper quadrant pain EXAM: ULTRASOUND ABDOMEN LIMITED RIGHT UPPER QUADRANT COMPARISON:  None. FINDINGS: Gallbladder: Distended gallbladder. No cholelithiasis. No wall thickening or pericholecystic fluid. A positive sonographic MuPercell Millerign was reported by the sonographer. Common bile duct: Diameter: 4 mm Liver: Increased echogenicity of the hepatic  parenchyma. Visualization otherwise limited by body habitus Weiss bowel gas. Portal vein is patent on color Doppler imaging with normal direction of blood flow towards the liver. IMPRESSION: 1. Distended gallbladder Weiss positive sonographic Murphy sign, indeterminate in the absence of cholelithiasis. CT of the abdomen Weiss pelvis might be helpful for further characterization. 2. Hepatic steatosis. 3. No biliary dilatation. Electronically Signed   By: KeUlyses Jarred  M.D.   On: 01/12/2018 15:43     Assessment & Plan:  SIYONA COTO is a 62 y.o. female with acute cholecystitis. Somewhat improved from yesterday when I spoke with her in the ED. She has been on zosyn Weiss has been tolerating some clear liquids.   -OR tomorrow for Laparoscopic cholecystectomy -NPO at midnight, preop orders written   PLAN: I counseled the patient about the indication, risks Weiss benefits of laparoscopic cholecystectomy.  She understands there is a very small chance for bleeding, infection, injury to normal structures (including common bile duct), conversion to open surgery, persistent symptoms, evolution of postcholecystectomy diarrhea, need for secondary interventions, anesthesia reaction, cardiopulmonary issues Weiss other risks not specifically detailed here. I described the expected recovery, the plan for follow-up Weiss the restrictions during the recovery phase.  All questions were answered.  Potentially home tomorrow after surgery.   Virl Cagey 01/12/2018, 6:13 PM

## 2018-01-12 NOTE — H&P (View-Only) (Signed)
[facility] Consult  Reason for Consult: Acute Cholecystitis  Referring Physician:  Dr. Wilson Singer  Chief Complaint    Abdominal Pain      Monique Weiss is a 62 y.o. female.  HPI: Monique Weiss is a 62 yo with a history of DM, hypothyroidism who developed onset of abdominal pain on Friday that has been getting progressively worse. The pain is the the RUQ quadrant/ epigastric area.  She says that she has had some nausea and bloating and has not wanted to eat. She had some diarrhea, but no fevers or chills.  She went to her PCP, Dr. Willey Blade, and he recommended that she go to the ED for evaluation.   She reports never having any episodes of pain like this before and not having any episodes of pain with eating.  She is suppose to have a right knee surgery next week.  She has some baseline constipation and has not had a BM since she had diarrhea over the weekend.   Past Medical History:  Diagnosis Date  . Diabetes mellitus without complication ([place])   . Thyroid disease    Hypo    Past Surgical History:  Procedure Laterality Date  . REPLACEMENT TOTAL KNEE    . SHOULDER SURGERY      History reviewed. No pertinent family history.  Social History   Tobacco Use  . Smoking status: Never Smoker  . Smokeless tobacco: Never Used  Substance Use Topics  . Alcohol use: Not Currently  . Drug use: Never    Medications:  I have reviewed the patient's current medications. Prior to Admission:  Medications Prior to Admission  Medication Sig Dispense Refill Last Dose  . atorvastatin (LIPITOR) 40 MG tablet TK 1 T PO HS  11 01/11/2018 at Unknown time  . cyclobenzaprine (FLEXERIL) 10 MG tablet cyclobenzaprine 10 mg tablet   Past Week at Unknown time  . furosemide (LASIX) 40 MG tablet Take 80 mg by mouth daily.   01/12/2018 at Unknown time  . levothyroxine (SYNTHROID, LEVOTHROID) 75 MCG tablet TK 1 T  PO QD  12 01/12/2018 at Unknown time  . metFORMIN (GLUCOPHAGE) 1000 MG tablet TK 1 T PO  BID  3 01/12/2018 at Unknown time  . promethazine (PHENERGAN) 12.5 MG tablet TAKE 1 OR 2 TABLETS PO Q 6 H PRN NV  0 01/11/2018 at Unknown time  . sertraline (ZOLOFT) 50 MG tablet TK 1/2 T PO QD FOR 6 DAYS THEN TK 1 T PO DAILY  6 01/12/2018 at Unknown time  . spironolactone (ALDACTONE) 25 MG tablet spironolactone 25 mg tablet   01/12/2018 at Unknown time  . tiZANidine (ZANAFLEX) 2 MG tablet TK 1 T PO BID PRF SPASM  2 Past Week at Unknown time  . acetaminophen-codeine (TYLENOL #3) 300-30 MG tablet Take 1 tablet by mouth at bedtime.  0 unknown  . HYDROcodone-acetaminophen (NORCO/VICODIN) 5-325 MG tablet TK 1 T PO BID PRN FOR PAIN  0 unknown  . Tofacitinib Citrate (XELJANZ XR) 11 MG TB24 Xeljanz XR 11 mg tablet,extended release   12/19/2017   Scheduled: . insulin aspart  0-9 Units Subcutaneous TID WC  . levothyroxine  75 mcg Oral QAC breakfast  . sertraline  50 mg Oral Daily   Continuous: . sodium chloride 100 mL/hr at 01/12/18 2158  . famotidine (PEPCID) IV Stopped (01/13/18 1316)  . piperacillin-tazobactam Stopped (01/13/18 1236)   XTA:[id number] **AND** acetaminophen, HYDROmorphone (DILAUDID) injection, HYDROmorphone (DILAUDID) injection, ondansetron **OR** ondansetron (ZOFRAN) IV, promethazine  No Known  Allergies   ROS:  A comprehensive review of systems was negative except for: Gastrointestinal: positive for abdominal pain, constipation, nausea and bloating  Blood pressure 110/71, pulse (!) 108, temperature 97.9 F (36.6 C), temperature source Temporal, resp. rate 16, height '5\' 10"'  (1.778 m), weight 238 lb (108 kg), SpO2 95 %. Physical Exam  Constitutional: She is oriented to person, place, and time. She appears well-developed and well-nourished.  HENT:  Head: Normocephalic.  Eyes: Pupils are equal, round, and reactive to light.  Cardiovascular: Normal rate and regular rhythm.  Pulmonary/Chest: Effort normal and breath sounds normal.  Abdominal: Soft. Normal appearance. She  exhibits no distension. There is tenderness in the right upper quadrant.  Diastasis of the abdomen  Musculoskeletal:  No edema  Neurological: She is alert and oriented to person, place, and time.  Skin: Skin is warm and dry.  Psychiatric: She has a normal mood and affect. Her behavior is normal.  Vitals reviewed.   Results: Results for orders placed or performed during the hospital encounter of 01/12/18 (from the past 48 hour(s))  Urinalysis, Routine w reflex microscopic     Status: Abnormal   Collection Time: 01/12/18 12:17 PM  Result Value Ref Range   Color, Urine AMBER (A) YELLOW    Comment: BIOCHEMICALS MAY BE AFFECTED BY COLOR   APPearance CLOUDY (A) CLEAR   Specific Gravity, Urine 1.014 1.005 - 1.030   pH 5.0 5.0 - 8.0   Glucose, UA NEGATIVE NEGATIVE mg/dL   Hgb urine dipstick NEGATIVE NEGATIVE   Bilirubin Urine NEGATIVE NEGATIVE   Ketones, ur NEGATIVE NEGATIVE mg/dL   Protein, ur 30 (A) NEGATIVE mg/dL   Nitrite NEGATIVE NEGATIVE   Leukocytes, UA LARGE (A) NEGATIVE   RBC / HPF 11-20 0 - 5 RBC/hpf   WBC, UA >50 (H) 0 - 5 WBC/hpf   Bacteria, UA FEW (A) NONE SEEN   Squamous Epithelial / LPF 21-50 0 - 5   WBC Clumps PRESENT    Mucus PRESENT    Hyaline Casts, UA PRESENT    Non Squamous Epithelial 0-5 (A) NONE SEEN    Comment: Performed at [facility], [address], [place], [place] [address]  Lipase, blood     Status: None   Collection Time: 01/12/18 12:55 PM  Result Value Ref Range   Lipase 28 11 - 51 U/L    Comment: Performed at [facility], [address] [place],  [address]  Comprehensive metabolic panel     Status: Abnormal   Collection Time: 01/12/18 12:55 PM  Result Value Ref Range   Sodium 139 135 - 145 mmol/L   Potassium 3.9 3.5 - 5.1 mmol/L   Chloride 98 98 - 111 mmol/L   CO2 29 22 - 32 mmol/L   Glucose, Bld 144 (H) 70 - 99 mg/dL   BUN 14 8 - 23 mg/dL   Creatinine, Ser 0.76 0.44 - 1.00 mg/dL   Calcium 9.5 8.9 - 10.3 mg/dL   Total Protein 7.5  6.5 - 8.1 g/dL   Albumin 4.6 3.5 - 5.0 g/dL   AST 19 15 - 41 U/L   ALT 20 0 - 44 U/L   Alkaline Phosphatase 69 38 - 126 U/L   Total Bilirubin 1.3 (H) 0.3 - 1.2 mg/dL   GFR calc non Af Amer >60 >60 mL/min   GFR calc Af Amer >60 >60 mL/min    Comment: (NOTE) The eGFR has been calculated using the CKD EPI equation. This calculation has not been validated  in all clinical situations. eGFR's persistently <60 mL/min signify possible Chronic Kidney Disease.    Anion gap 12 5 - 15    Comment: Performed at [facility], [address], [place], [place] [address]  CBC     Status: Abnormal   Collection Time: 01/12/18 12:55 PM  Result Value Ref Range   WBC 18.8 (H) 4.0 - 10.5 K/uL   RBC 4.90 3.87 - 5.11 MIL/uL   Hemoglobin 15.5 (H) 12.0 - 15.0 g/dL   HCT 46.0 36.0 - 46.0 %   MCV 93.9 78.0 - 100.0 fL   MCH 31.6 26.0 - 34.0 pg   MCHC 33.7 30.0 - 36.0 g/dL   RDW 12.4 11.5 - 15.5 %   Platelets 160 150 - 400 K/uL    Comment: Performed at [facility], [address], [place], [place] [address]   Personally reviewed- gallbladder distended with fluid around the gallbladder, diastasis of the abdominal wall in the epigastric area, possible small fat containing hernia within the diastasis at the level of the umbilicus; CBD 35m on [place] Ct Abdomen Pelvis W Contrast  Result Date: 01/12/2018 CLINICAL DATA:  Abdominal pain worse with palpation. EXAM: CT ABDOMEN AND PELVIS WITH CONTRAST TECHNIQUE: Multidetector CT imaging of the abdomen and pelvis was performed using the standard protocol following bolus administration of intravenous contrast. CONTRAST:  1093mISOVUE-300 IOPAMIDOL (ISOVUE-300) INJECTION 61% COMPARISON:  Abdominal ultrasound January 12, 2018 and CT abdomen and pelvis March 19, 2010. FINDINGS: LOWER CHEST: Bibasilar atelectasis. Heart is mildly enlarged. Trace pericardial effusion. Enhancing wedge-like consolidation lingula. HEPATOBILIARY: Distended gallbladder with wall thickening, pericholecystic  effusion and fat stranding. Normal liver. PANCREAS: Normal.  Punctate peripancreatic calcification. SPLEEN: Normal. ADRENALS/URINARY TRACT: Kidneys are orthotopic, demonstrating symmetric enhancement. No nephrolithiasis, hydronephrosis or solid renal masses. The unopacified ureters are normal in course and caliber. Delayed imaging through the kidneys demonstrates symmetric prompt contrast excretion within the proximal urinary collecting system. Urinary bladder is well distended and unremarkable. Normal adrenal glands. STOMACH/BOWEL: The stomach, small and large bowel are normal in course and caliber without inflammatory changes, sensitivity decreased without oral contrast. Moderate descending and sigmoid colonic diverticulosis. The appendix is not discretely identified, however there are no inflammatory changes in the right lower quadrant. VASCULAR/LYMPHATIC: Aortoiliac vessels are normal in course and caliber. Mild calcific atherosclerosis. No lymphadenopathy by CT size criteria. REPRODUCTIVE: Normal. OTHER: No intraperitoneal free fluid or free air. MUSCULOSKELETAL: Nonacute. Anterior abdominal wall ligamentous laxity. Small fat containing umbilical hernia, suspected ventral herniorrhaphy. Sacralized LEFT L5 vertebral body. L2-3 through L5-S1 interbody disc prosthesis with arthrodesis and laminectomies. IMPRESSION: 1. Acute cholecystitis. 2. Lingular atelectasis, less likely pneumonia. Aortic Atherosclerosis (ICD10-I70.0). Electronically Signed   By: CoElon Alas.D.   On: 01/12/2018 17:01   UsKoreabdomen Limited  Result Date: 01/12/2018 CLINICAL DATA:  Right upper quadrant pain EXAM: ULTRASOUND ABDOMEN LIMITED RIGHT UPPER QUADRANT COMPARISON:  None. FINDINGS: Gallbladder: Distended gallbladder. No cholelithiasis. No wall thickening or pericholecystic fluid. A positive sonographic MuPercell Millerign was reported by the sonographer. Common bile duct: Diameter: 4 mm Liver: Increased echogenicity of the hepatic  parenchyma. Visualization otherwise limited by body habitus and bowel gas. Portal vein is patent on color Doppler imaging with normal direction of blood flow towards the liver. IMPRESSION: 1. Distended gallbladder and positive sonographic Murphy sign, indeterminate in the absence of cholelithiasis. CT of the abdomen and pelvis might be helpful for further characterization. 2. Hepatic steatosis. 3. No biliary dilatation. Electronically Signed   By: KeUlyses Jarred  M.D.   On: 01/12/2018 15:43     Assessment & Plan:  YANISSA MICHALSKY is a 62 y.o. female with acute cholecystitis. Somewhat improved from yesterday when I spoke with her in the ED. She has been on zosyn and has been tolerating some clear liquids.   -OR tomorrow for Laparoscopic cholecystectomy -NPO at midnight, preop orders written   PLAN: I counseled the patient about the indication, risks and benefits of laparoscopic cholecystectomy.  She understands there is a very small chance for bleeding, infection, injury to normal structures (including common bile duct), conversion to open surgery, persistent symptoms, evolution of postcholecystectomy diarrhea, need for secondary interventions, anesthesia reaction, cardiopulmonary issues and other risks not specifically detailed here. I described the expected recovery, the plan for follow-up and the restrictions during the recovery phase.  All questions were answered.  Potentially home tomorrow after surgery.   Virl Cagey 01/12/2018, 6:13 PM

## 2018-01-12 NOTE — ED Triage Notes (Signed)
Pt sent over by Dr. Willey Blade for RT sided abdominal pain that worsens with palpation. Pt endorses nausea. Sent here to r/o cholecystitis.

## 2018-01-12 NOTE — Progress Notes (Addendum)
[facility]   Patient seen. Cholecystitis on CT with distended, thickened gallbladder wall.  Can have clears now. Recommend Iv antibiotics for cholecystitis, zosyn is good.  Will likely be able to do gallbladder Wednesday.  Full note to follow. Will see tomorrow.  Curlene Labrum, MD [facility] [address] [place], [place] [address] [phone] (office)

## 2018-01-12 NOTE — ED Provider Notes (Signed)
[facility] EMERGENCY DEPARTMENT Provider Note   CSN: [id number] Arrival date & time: 01/12/18  1156     History   Chief Complaint Chief Complaint  Patient presents with  . Abdominal Pain    HPI Monique Weiss is a 62 y.o. female.  HPI   62 year old female with abdominal pain.  Onset Friday.  Pain is in epigastric and right upper quadrant.  Constant.  Associate with nausea and anorexia.  She had some diarrhea over the weekend which has since resolved.  No fevers or chills.  No acute respiratory complaints.  She went to see her PCP today who was concerned that her pain may be related to her gallbladder and referred to the emergency room.  She has had no prior abdominal surgery.  Past Medical History:  Diagnosis Date  . Diabetes mellitus without complication ([place])   . Thyroid disease    Hypo    There are no active problems to display for this patient.   Past Surgical History:  Procedure Laterality Date  . REPLACEMENT TOTAL KNEE    . SHOULDER SURGERY       OB History   None      Home Medications    Prior to Admission medications   Medication Sig Start Date End Date Taking? Authorizing Provider  atorvastatin (LIPITOR) 40 MG tablet TK 1 T PO HS 10/22/17  Yes [provider]  cyclobenzaprine (FLEXERIL) 10 MG tablet cyclobenzaprine 10 mg tablet   Yes [provider]  furosemide (LASIX) 40 MG tablet Take 80 mg by mouth daily.   Yes [provider]  levothyroxine (SYNTHROID, LEVOTHROID) 75 MCG tablet TK 1 T  PO QD 12/29/17  Yes [provider]  metFORMIN (GLUCOPHAGE) 1000 MG tablet TK 1 T PO BID 12/18/17  Yes [provider]  promethazine (PHENERGAN) 12.5 MG tablet TAKE 1 OR 2 TABLETS PO Q 6 H PRN NV 01/02/18  Yes [provider]  sertraline (ZOLOFT) 50 MG tablet TK 1/2 T PO QD FOR 6 DAYS THEN TK 1 T PO DAILY 12/11/17  Yes [provider]  spironolactone (ALDACTONE) 25 MG tablet spironolactone 25 mg tablet   Yes  [provider]  tiZANidine (ZANAFLEX) 2 MG tablet TK 1 T PO BID PRF SPASM 12/30/17  Yes [provider]  acetaminophen-codeine (TYLENOL #3) 300-30 MG tablet Take 1 tablet by mouth at bedtime. 01/02/18   [provider]  HYDROcodone-acetaminophen (NORCO/VICODIN) 5-325 MG tablet TK 1 T PO BID PRN FOR PAIN 12/29/17   [provider]  Tofacitinib Citrate (XELJANZ XR) 11 MG TB24 Xeljanz XR 11 mg tablet,extended release    [provider]    Family History No family history on file.  Social History Social History   Tobacco Use  . Smoking status: Never Smoker  . Smokeless tobacco: Never Used  Substance Use Topics  . Alcohol use: Not Currently  . Drug use: Never     Allergies   Patient has no known allergies.   Review of Systems Review of Systems  All systems reviewed and negative, other than as noted in HPI.  Physical Exam Updated Vital Signs BP 110/71 (BP Location: Right Arm)   Pulse (!) 108   Temp 97.9 F (36.6 C) (Temporal)   Resp 16   Ht 5\' 10"  (1.778 m)   Wt 108 kg (238 lb)   SpO2 95%   BMI 34.15 kg/m   Physical Exam  Constitutional: She appears well-developed and well-nourished. No distress.  HENT:  Head: Normocephalic and atraumatic.  Eyes: Conjunctivae are normal. Right eye exhibits no discharge. Left eye exhibits no discharge.  Neck: Neck supple.  Cardiovascular: Normal rate, regular rhythm and normal heart sounds. Exam reveals no gallop and no friction rub.  No murmur heard. Pulmonary/Chest: Effort normal and breath sounds normal. No respiratory distress.  Abdominal: Soft. She exhibits no distension. There is tenderness.  Tenderness in the epigastrium and right upper quadrant no rebound or guarding.  No distention.  Musculoskeletal: She exhibits no edema or tenderness.  Neurological: She is alert.  Skin: Skin is warm and dry.  Psychiatric: She has a normal mood and affect. Her behavior is normal. Thought content  normal.  Nursing note and vitals reviewed.    ED Treatments / Results  Labs (all labs ordered are listed, but only abnormal results are displayed) Labs Reviewed  COMPREHENSIVE METABOLIC PANEL - Abnormal; Notable for the following components:      Result Value   Glucose, Bld 144 (*)    Total Bilirubin 1.3 (*)    All other components within normal limits  CBC - Abnormal; Notable for the following components:   WBC 18.8 (*)    Hemoglobin 15.5 (*)    All other components within normal limits  URINALYSIS, ROUTINE W REFLEX MICROSCOPIC - Abnormal; Notable for the following components:   Color, Urine AMBER (*)    APPearance CLOUDY (*)    Protein, ur 30 (*)    Leukocytes, UA LARGE (*)    WBC, UA >50 (*)    Bacteria, UA FEW (*)    Non Squamous Epithelial 0-5 (*)    All other components within normal limits  URINE CULTURE  LIPASE, BLOOD    EKG None  Radiology Ct Abdomen Pelvis W Contrast  Result Date: 01/12/2018 CLINICAL DATA:  Abdominal pain worse with palpation. EXAM: CT ABDOMEN AND PELVIS WITH CONTRAST TECHNIQUE: Multidetector CT imaging of the abdomen and pelvis was performed using the standard protocol following bolus administration of intravenous contrast. CONTRAST:  178mL ISOVUE-300 IOPAMIDOL (ISOVUE-300) INJECTION 61% COMPARISON:  Abdominal ultrasound January 12, 2018 and CT abdomen and pelvis March 19, 2010. FINDINGS: LOWER CHEST: Bibasilar atelectasis. Heart is mildly enlarged. Trace pericardial effusion. Enhancing wedge-like consolidation lingula. HEPATOBILIARY: Distended gallbladder with wall thickening, pericholecystic effusion and fat stranding. Normal liver. PANCREAS: Normal.  Punctate peripancreatic calcification. SPLEEN: Normal. ADRENALS/URINARY TRACT: Kidneys are orthotopic, demonstrating symmetric enhancement. No nephrolithiasis, hydronephrosis or solid renal masses. The unopacified ureters are normal in course and caliber. Delayed imaging through the kidneys  demonstrates symmetric prompt contrast excretion within the proximal urinary collecting system. Urinary bladder is well distended and unremarkable. Normal adrenal glands. STOMACH/BOWEL: The stomach, small and large bowel are normal in course and caliber without inflammatory changes, sensitivity decreased without oral contrast. Moderate descending and sigmoid colonic diverticulosis. The appendix is not discretely identified, however there are no inflammatory changes in the right lower quadrant. VASCULAR/LYMPHATIC: Aortoiliac vessels are normal in course and caliber. Mild calcific atherosclerosis. No lymphadenopathy by CT size criteria. REPRODUCTIVE: Normal. OTHER: No intraperitoneal free fluid or free air. MUSCULOSKELETAL: Nonacute. Anterior abdominal wall ligamentous laxity. Small fat containing umbilical hernia, suspected ventral herniorrhaphy. Sacralized LEFT L5 vertebral body. L2-3 through L5-S1 interbody disc prosthesis with arthrodesis and laminectomies. IMPRESSION: 1. Acute cholecystitis. 2. Lingular atelectasis, less likely pneumonia. Aortic Atherosclerosis (ICD10-I70.0). Electronically Signed   By: Elon Alas M.D.   On: 01/12/2018 17:01   US Abdomen Limited  Result Date: 01/12/2018 CLINICAL DATA:  Right upper quadrant  pain EXAM: ULTRASOUND ABDOMEN LIMITED RIGHT UPPER QUADRANT COMPARISON:  None. FINDINGS: Gallbladder: Distended gallbladder. No cholelithiasis. No wall thickening or pericholecystic fluid. A positive sonographic Percell Miller sign was reported by the sonographer. Common bile duct: Diameter: 4 mm Liver: Increased echogenicity of the hepatic parenchyma. Visualization otherwise limited by body habitus and bowel gas. Portal vein is patent on color Doppler imaging with normal direction of blood flow towards the liver. IMPRESSION: 1. Distended gallbladder and positive sonographic Murphy sign, indeterminate in the absence of cholelithiasis. CT of the abdomen and pelvis might be helpful for further  characterization. 2. Hepatic steatosis. 3. No biliary dilatation. Electronically Signed   By: Ulyses Jarred M.D.   On: 01/12/2018 15:43    Procedures Procedures (including critical care time)  Medications Ordered in ED Medications  0.9 %  sodium chloride infusion ( Intravenous New Bag/Given 01/12/18 1454)  piperacillin-tazobactam (ZOSYN) IVPB 3.375 g (has no administration in time range)  HYDROmorphone (DILAUDID) injection 1 mg (1 mg Intravenous Given 01/12/18 1453)  ondansetron (ZOFRAN) injection 4 mg (4 mg Intravenous Given 01/12/18 1454)  iopamidol (ISOVUE-300) 61 % injection 100 mL (100 mLs Intravenous Contrast Given 01/12/18 1631)     Initial Impression / Assessment and Plan / ED Course  I have reviewed the triage vital signs and the nursing notes.  Pertinent labs & imaging results that were available during my care of the patient were reviewed by me and considered in my medical decision making (see chart for details).     62 year old female with right upper quadrant pain.  Significant leukocytosis.  Imaging is consistent with cholecystitis.  Antibiotics.  Discussed with Dr. Constance Haw, general surgery.  Admission to medical service.  Final Clinical Impressions(s) / ED Diagnoses   Final diagnoses:  Dyspnea  Acute cholecystitis    ED Discharge Orders    None       Virgel Manifold, MD 01/18/18 [phone]

## 2018-01-13 DIAGNOSIS — E782 Mixed hyperlipidemia: Secondary | ICD-10-CM

## 2018-01-13 DIAGNOSIS — R319 Hematuria, unspecified: Secondary | ICD-10-CM

## 2018-01-13 DIAGNOSIS — N390 Urinary tract infection, site not specified: Secondary | ICD-10-CM

## 2018-01-13 DIAGNOSIS — K810 Acute cholecystitis: Principal | ICD-10-CM

## 2018-01-13 LAB — GLUCOSE, CAPILLARY
Glucose-Capillary: 115 mg/dL — ABNORMAL HIGH (ref 70–99)
Glucose-Capillary: 170 mg/dL — ABNORMAL HIGH (ref 70–99)
Glucose-Capillary: 118 mg/dL — ABNORMAL HIGH (ref 70–99)
Glucose-Capillary: 116 mg/dL — ABNORMAL HIGH (ref 70–99)

## 2018-01-13 LAB — BASIC METABOLIC PANEL
Anion gap: 8 (ref 5–15)
BUN: 16 mg/dL (ref 8–23)
CO2: 34 mmol/L — ABNORMAL HIGH (ref 22–32)
Calcium: 8.7 mg/dL — ABNORMAL LOW (ref 8.9–10.3)
Chloride: 98 mmol/L (ref 98–111)
Creatinine, Ser: 0.88 mg/dL (ref 0.44–1.00)
GFR calc Af Amer: >60 mL/min (ref >60)
GFR calc non Af Amer: >60 mL/min (ref >60)
Glucose, Bld: 114 mg/dL — ABNORMAL HIGH (ref 70–99)
Potassium: 3.9 mmol/L (ref 3.5–5.1)
Sodium: 140 mmol/L (ref 135–145)

## 2018-01-13 LAB — CBC WITH DIFFERENTIAL/PLATELET
Basophils Absolute: 0 10*3/uL (ref 0.0–0.1)
Basophils Relative: 0 %
Eosinophils Absolute: 0.1 10*3/uL (ref 0.0–0.7)
Eosinophils Relative: 1 %
HCT: 40.1 % (ref 36.0–46.0)
Hemoglobin: 12.9 g/dL (ref 12.0–15.0)
Lymphocytes Relative: 22 %
Lymphs Abs: 2.5 10*3/uL (ref 0.7–4.0)
MCH: 30.6 pg (ref 26.0–34.0)
MCHC: 32.2 g/dL (ref 30.0–36.0)
MCV: 95.2 fL (ref 78.0–100.0)
Monocytes Absolute: 1.4 10*3/uL — ABNORMAL HIGH (ref 0.1–1.0)
Monocytes Relative: 12 %
Neutro Abs: 7.6 10*3/uL (ref 1.7–7.7)
Neutrophils Relative %: 65 %
Platelets: 146 10*3/uL — ABNORMAL LOW (ref 150–400)
RBC: 4.21 MIL/uL (ref 3.87–5.11)
RDW: 12.6 % (ref 11.5–15.5)
WBC: 11.7 10*3/uL — ABNORMAL HIGH (ref 4.0–10.5)

## 2018-01-13 LAB — HEPATIC FUNCTION PANEL
ALT: 16 U/L (ref 0–44)
AST: 12 U/L — ABNORMAL LOW (ref 15–41)
Albumin: 3.8 g/dL (ref 3.5–5.0)
Alkaline Phosphatase: 61 U/L (ref 38–126)
Bilirubin, Direct: 0.2 mg/dL (ref 0.0–0.2)
Indirect Bilirubin: 1 mg/dL — ABNORMAL HIGH (ref 0.3–0.9)
Total Bilirubin: 1.2 mg/dL (ref 0.3–1.2)
Total Protein: 6.3 g/dL — ABNORMAL LOW (ref 6.5–8.1)

## 2018-01-13 MED ORDER — CHLORHEXIDINE GLUCONATE CLOTH 2 % EX PADS
6.0000 | MEDICATED_PAD | Freq: Once | CUTANEOUS | Status: AC
  Administered 2018-01-14: 6 via TOPICAL

## 2018-01-13 MED ORDER — CHLORHEXIDINE GLUCONATE CLOTH 2 % EX PADS
6.0000 | MEDICATED_PAD | Freq: Once | CUTANEOUS | Status: AC
  Administered 2018-01-13: 6 via TOPICAL

## 2018-01-13 MED ORDER — POLYETHYLENE GLYCOL 3350 17 G PO PACK
17.0000 g | PACK | Freq: Every day | ORAL | Status: DC
  Administered 2018-01-13 – 2018-01-15 (×2): 17 g via ORAL
  Filled 2018-01-13 (×3): qty 1

## 2018-01-13 MED ORDER — INSULIN ASPART 100 UNIT/ML ~~LOC~~ SOLN
0.0000 [IU] | Freq: Three times a day (TID) | SUBCUTANEOUS | Status: DC
  Administered 2018-01-13: 2 [IU] via SUBCUTANEOUS
  Administered 2018-01-15 (×2): 1 [IU] via SUBCUTANEOUS

## 2018-01-13 MED ORDER — DOCUSATE SODIUM 100 MG PO CAPS
100.0000 mg | ORAL_CAPSULE | Freq: Two times a day (BID) | ORAL | Status: DC
  Administered 2018-01-13 – 2018-01-15 (×4): 100 mg via ORAL
  Filled 2018-01-13 (×5): qty 1

## 2018-01-13 NOTE — Progress Notes (Signed)
PROGRESS NOTE  Monique Weiss IRC:[id number] DOB: 03-22-1956 DOA: 01/12/2018 PCP: Asencion Noble, MD  Brief History:  62 year old female with a history of hypothyroidism, hyperlipidemia, diabetes mellitus presenting with right-sided abdominal pain that began on 01/09/2018.  This was associated with loose stools on 01/10/2018 and 01/11/2018.  She noted increasing abdominal pain over the weekend.  She went to see her primary care provider on 01/12/2018 advised patient to go to the emergency department for further evaluation.  The patient denied any fevers, chills, chest pain, shortness breath, vomiting.  There is no hematochezia or melena or dysuria, or hematuria.  CT of the abdomen and pelvis showed distended gallbladder with wall thickening and pericholecystic effusion with fat stranding.  Right upper quadrant ultrasound showed a distended gallbladder Murphy sign.  General surgery was consulted to assist with management.  Assessment/Plan: Acute cholecystitis -Appreciate general surgery consult--> planning cholecystectomy 01/14/2018 -Continue Zosyn -Continue clear liquids for now -Pain control -continue IVF  Lingular consolidation -Incidental finding on CT abdomen -Clinically not consistent with pneumonia as the patient denies any dyspnea, coughing; patient without hypoxia  Pyuria -Follow urine culture -Already on Zosyn for cholecystitis  Hyponatremia -Secondary to triamterene HCTZ -Hold triamterene HCTZ for now  Diabetes mellitus type 2 -Hemoglobin A1c -NovoLog sliding scale -Holding metformin  Hyperlipidemia -Restart Lipitor once able to tolerate po  Hypothyroidism -Continue Synthroid  Anxiety -Continue sertraline 50 mg p.o. Daily.    Disposition Plan:   Home 7/25 if stable Family Communication:   Family at bedside  Consultants:  General Surgery  Code Status:  FULL  DVT Prophylaxis:  [place] Heparin   Procedures: As Listed in Progress Note  Above  Antibiotics: Zosyn 7/22>>    Subjective: Patient continues to have epigastric and right upper quadrant abdominal pain.  She has nausea without emesis patient denies any fevers, chills, headache, chest pain, shortness breath, dysuria, hematuria.  There is no rashes.  There is no hematochezia or melena.  Objective: Vitals:   01/12/18 1922 01/12/18 1925 01/12/18 2025 01/13/18 0632  BP: 109/76  110/75 97/67  Pulse:  93 83 79  Resp:   18 16  Temp:   98.4 F (36.9 C) 97.8 F (36.6 C)  TempSrc:   Oral Oral  SpO2:  91% 96% 100%  Weight:      Height:       No intake or output data in the 24 hours ending 01/13/18 0805 Weight change:  Exam:   General:  Pt is alert, follows commands appropriately, not in acute distress  HEENT: No icterus, No thrush, No neck mass, [place]/AT  Cardiovascular: RRR, S1/S2, no rubs, no gallops  Respiratory: CTA bilaterally, no wheezing, no crackles, no rhonchi  Abdomen: Soft/+BS, epigastric/RUQ tender, non distended, no guarding  Extremities: No edema, No lymphangitis, No petechiae, No rashes, no synovitis   Data Reviewed: I have personally reviewed following labs and imaging studies Basic Metabolic Panel: Recent Labs  Lab 01/12/18 1255 01/13/18 0449  NA 139 140  K 3.9 3.9  CL 98 98  CO2 29 34*  GLUCOSE 144* 114*  BUN 14 16  CREATININE 0.76 0.88  CALCIUM 9.5 8.7*   Liver Function Tests: Recent Labs  Lab 01/12/18 1255 01/13/18 0449  AST 19 12*  ALT 20 16  ALKPHOS 69 61  BILITOT 1.3* 1.2  PROT 7.5 6.3*  ALBUMIN 4.6 3.8   Recent Labs  Lab 01/12/18 1255  LIPASE 28   No results for  input(s): AMMONIA in the last 168 hours. Coagulation Profile: No results for input(s): INR, PROTIME in the last 168 hours. CBC: Recent Labs  Lab 01/12/18 1255 01/13/18 0449  WBC 18.8* 11.7*  NEUTROABS  --  7.6  HGB 15.5* 12.9  HCT 46.0 40.1  MCV 93.9 95.2  PLT 160 146*   Cardiac Enzymes: No results for input(s): CKTOTAL, CKMB,  CKMBINDEX, TROPONINI in the last 168 hours. BNP: Invalid input(s): POCBNP CBG: Recent Labs  Lab 01/12/18 2252  GLUCAP 149*   HbA1C: No results for input(s): HGBA1C in the last 72 hours. Urine analysis:    Component Value Date/Time   COLORURINE AMBER (A) 01/12/2018 1217   APPEARANCEUR CLOUDY (A) 01/12/2018 1217   LABSPEC 1.014 01/12/2018 1217   PHURINE 5.0 01/12/2018 1217   GLUCOSEU NEGATIVE 01/12/2018 1217   HGBUR NEGATIVE 01/12/2018 1217   BILIRUBINUR NEGATIVE 01/12/2018 1217   KETONESUR NEGATIVE 01/12/2018 1217   PROTEINUR 30 (A) 01/12/2018 1217   NITRITE NEGATIVE 01/12/2018 1217   LEUKOCYTESUR LARGE (A) 01/12/2018 1217   Sepsis Labs: @LABRCNTIP (procalcitonin:4,lacticidven:4) )No results found for this or any previous visit (from the past 240 hour(s)).   Scheduled Meds: . levothyroxine  75 mcg Oral QAC breakfast  . sertraline  50 mg Oral Daily   Continuous Infusions: . sodium chloride 100 mL/hr at 01/12/18 2158  . famotidine (PEPCID) IV Stopped (01/12/18 2248)  . piperacillin-tazobactam Stopped (01/13/18 0554)    Procedures/Studies: Ct Abdomen Pelvis W Contrast  Result Date: 01/12/2018 CLINICAL DATA:  Abdominal pain worse with palpation. EXAM: CT ABDOMEN AND PELVIS WITH CONTRAST TECHNIQUE: Multidetector CT imaging of the abdomen and pelvis was performed using the standard protocol following bolus administration of intravenous contrast. CONTRAST:  136mL ISOVUE-300 IOPAMIDOL (ISOVUE-300) INJECTION 61% COMPARISON:  Abdominal ultrasound January 12, 2018 and CT abdomen and pelvis March 19, 2010. FINDINGS: LOWER CHEST: Bibasilar atelectasis. Heart is mildly enlarged. Trace pericardial effusion. Enhancing wedge-like consolidation lingula. HEPATOBILIARY: Distended gallbladder with wall thickening, pericholecystic effusion and fat stranding. Normal liver. PANCREAS: Normal.  Punctate peripancreatic calcification. SPLEEN: Normal. ADRENALS/URINARY TRACT: Kidneys are orthotopic,  demonstrating symmetric enhancement. No nephrolithiasis, hydronephrosis or solid renal masses. The unopacified ureters are normal in course and caliber. Delayed imaging through the kidneys demonstrates symmetric prompt contrast excretion within the proximal urinary collecting system. Urinary bladder is well distended and unremarkable. Normal adrenal glands. STOMACH/BOWEL: The stomach, small and large bowel are normal in course and caliber without inflammatory changes, sensitivity decreased without oral contrast. Moderate descending and sigmoid colonic diverticulosis. The appendix is not discretely identified, however there are no inflammatory changes in the right lower quadrant. VASCULAR/LYMPHATIC: Aortoiliac vessels are normal in course and caliber. Mild calcific atherosclerosis. No lymphadenopathy by CT size criteria. REPRODUCTIVE: Normal. OTHER: No intraperitoneal free fluid or free air. MUSCULOSKELETAL: Nonacute. Anterior abdominal wall ligamentous laxity. Small fat containing umbilical hernia, suspected ventral herniorrhaphy. Sacralized LEFT L5 vertebral body. L2-3 through L5-S1 interbody disc prosthesis with arthrodesis and laminectomies. IMPRESSION: 1. Acute cholecystitis. 2. Lingular atelectasis, less likely pneumonia. Aortic Atherosclerosis (ICD10-I70.0). Electronically Signed   By: Elon Alas M.D.   On: 01/12/2018 17:01   US Abdomen Limited  Result Date: 01/12/2018 CLINICAL DATA:  Right upper quadrant pain EXAM: ULTRASOUND ABDOMEN LIMITED RIGHT UPPER QUADRANT COMPARISON:  None. FINDINGS: Gallbladder: Distended gallbladder. No cholelithiasis. No wall thickening or pericholecystic fluid. A positive sonographic Percell Miller sign was reported by the sonographer. Common bile duct: Diameter: 4 mm Liver: Increased echogenicity of the hepatic parenchyma. Visualization otherwise limited by body habitus and  bowel gas. Portal vein is patent on color Doppler imaging with normal direction of blood flow towards the  liver. IMPRESSION: 1. Distended gallbladder and positive sonographic Murphy sign, indeterminate in the absence of cholelithiasis. CT of the abdomen and pelvis might be helpful for further characterization. 2. Hepatic steatosis. 3. No biliary dilatation. Electronically Signed   By: Ulyses Jarred M.D.   On: 01/12/2018 15:43   Dg Chest Port 1 View  Result Date: 01/12/2018 CLINICAL DATA:  Epigastric pain last week with diagnosis of gallstones. Surgery tomorrow. History of diabetes. EXAM: PORTABLE CHEST 1 VIEW COMPARISON:  10/03/2017 FINDINGS: Shallow inspiration with linear atelectasis in the lung bases. Heart size and pulmonary vascularity are normal. No focal consolidation or airspace disease. No blunting of costophrenic angles. No pneumothorax. Mediastinal contours appear intact. IMPRESSION: Shallow inspiration with linear atelectasis in the lung bases. Electronically Signed   By: Lucienne Capers M.D.   On: 01/12/2018 22:22    Orson Eva, DO  [facility] Pager [phone]  If 7PM-7AM, please contact night-coverage [email or web address] Password [email or web address] 01/13/2018, 8:05 AM   LOS: 1 day

## 2018-01-14 ENCOUNTER — Inpatient Hospital Stay (HOSPITAL_COMMUNITY): Payer: BLUE CROSS/BLUE SHIELD | Admitting: Anesthesiology

## 2018-01-14 ENCOUNTER — Encounter (HOSPITAL_COMMUNITY): Payer: Self-pay | Admitting: Anesthesiology

## 2018-01-14 ENCOUNTER — Encounter (HOSPITAL_COMMUNITY)
Admission: EM | Disposition: A | Discharge status: Home / Self Care | Payer: Self-pay | Source: Home / Self Care | Attending: Internal Medicine

## 2018-01-14 DIAGNOSIS — I10 Essential (primary) hypertension: Secondary | ICD-10-CM | POA: Diagnosis not present

## 2018-01-14 DIAGNOSIS — K819 Cholecystitis, unspecified: Secondary | ICD-10-CM | POA: Diagnosis not present

## 2018-01-14 DIAGNOSIS — E119 Type 2 diabetes mellitus without complications: Secondary | ICD-10-CM | POA: Diagnosis not present

## 2018-01-14 DIAGNOSIS — M6208 Separation of muscle (nontraumatic), other site: Secondary | ICD-10-CM

## 2018-01-14 HISTORY — PX: CHOLECYSTECTOMY: SHX55

## 2018-01-14 LAB — BASIC METABOLIC PANEL
Anion gap: 6 (ref 5–15)
BUN: 9 mg/dL (ref 8–23)
CO2: 33 mmol/L — ABNORMAL HIGH (ref 22–32)
Calcium: 8.8 mg/dL — ABNORMAL LOW (ref 8.9–10.3)
Chloride: 102 mmol/L (ref 98–111)
Creatinine, Ser: 0.61 mg/dL (ref 0.44–1.00)
GFR calc Af Amer: >60 mL/min (ref >60)
GFR calc non Af Amer: >60 mL/min (ref >60)
Glucose, Bld: 119 mg/dL — ABNORMAL HIGH (ref 70–99)
Potassium: 3.9 mmol/L (ref 3.5–5.1)
Sodium: 141 mmol/L (ref 135–145)

## 2018-01-14 LAB — CBC WITH DIFFERENTIAL/PLATELET
Basophils Absolute: 0 10*3/uL (ref 0.0–0.1)
Basophils Relative: 0 %
Eosinophils Absolute: 0.2 10*3/uL (ref 0.0–0.7)
Eosinophils Relative: 2 %
HCT: 40.1 % (ref 36.0–46.0)
Hemoglobin: 12.9 g/dL (ref 12.0–15.0)
Lymphocytes Relative: 25 %
Lymphs Abs: 2.1 10*3/uL (ref 0.7–4.0)
MCH: 30.8 pg (ref 26.0–34.0)
MCHC: 32.2 g/dL (ref 30.0–36.0)
MCV: 95.7 fL (ref 78.0–100.0)
Monocytes Absolute: 1 10*3/uL (ref 0.1–1.0)
Monocytes Relative: 11 %
Neutro Abs: 5.1 10*3/uL (ref 1.7–7.7)
Neutrophils Relative %: 61 %
Platelets: 170 10*3/uL (ref 150–400)
RBC: 4.19 MIL/uL (ref 3.87–5.11)
RDW: 12.2 % (ref 11.5–15.5)
WBC: 8.4 10*3/uL (ref 4.0–10.5)

## 2018-01-14 LAB — HEPATIC FUNCTION PANEL
ALT: 15 U/L (ref 0–44)
AST: 13 U/L — ABNORMAL LOW (ref 15–41)
Albumin: 3.8 g/dL (ref 3.5–5.0)
Alkaline Phosphatase: 56 U/L (ref 38–126)
Bilirubin, Direct: 0.1 mg/dL (ref 0.0–0.2)
Indirect Bilirubin: 0.6 mg/dL (ref 0.3–0.9)
Total Bilirubin: 0.7 mg/dL (ref 0.3–1.2)
Total Protein: 6.5 g/dL (ref 6.5–8.1)

## 2018-01-14 LAB — GLUCOSE, CAPILLARY
Glucose-Capillary: 124 mg/dL — ABNORMAL HIGH (ref 70–99)
Glucose-Capillary: 123 mg/dL — ABNORMAL HIGH (ref 70–99)
Glucose-Capillary: 160 mg/dL — ABNORMAL HIGH (ref 70–99)
Glucose-Capillary: 129 mg/dL — ABNORMAL HIGH (ref 70–99)
Glucose-Capillary: 141 mg/dL — ABNORMAL HIGH (ref 70–99)

## 2018-01-14 SURGERY — LAPAROSCOPIC CHOLECYSTECTOMY
Anesthesia: General | Site: Abdomen

## 2018-01-14 MED ORDER — ROCURONIUM BROMIDE 50 MG/5ML IV SOLN
INTRAVENOUS | Status: AC
  Filled 2018-01-14: qty 1

## 2018-01-14 MED ORDER — SODIUM CHLORIDE 0.9 % IV SOLN
2.0000 g | INTRAVENOUS | Status: AC
  Administered 2018-01-14: 2 g via INTRAVENOUS
  Filled 2018-01-14: qty 2

## 2018-01-14 MED ORDER — MIDAZOLAM HCL 5 MG/5ML IJ SOLN
INTRAMUSCULAR | Status: DC | PRN
  Administered 2018-01-14 (×2): 1 mg via INTRAVENOUS

## 2018-01-14 MED ORDER — SUCCINYLCHOLINE CHLORIDE 20 MG/ML IJ SOLN
INTRAMUSCULAR | Status: DC | PRN
  Administered 2018-01-14: 140 mg via INTRAVENOUS

## 2018-01-14 MED ORDER — SODIUM CHLORIDE 0.9 % IJ SOLN
INTRAMUSCULAR | Status: AC
  Filled 2018-01-14: qty 10

## 2018-01-14 MED ORDER — ARTIFICIAL TEARS OPHTHALMIC OINT
TOPICAL_OINTMENT | OPHTHALMIC | Status: AC
  Filled 2018-01-14: qty 3.5

## 2018-01-14 MED ORDER — OXYCODONE HCL 5 MG PO TABS
5.0000 mg | ORAL_TABLET | ORAL | Status: DC | PRN
  Administered 2018-01-15: 5 mg via ORAL
  Filled 2018-01-14: qty 1

## 2018-01-14 MED ORDER — LACTATED RINGERS IV SOLN
INTRAVENOUS | Status: DC
  Administered 2018-01-14 (×2): via INTRAVENOUS

## 2018-01-14 MED ORDER — 0.9 % SODIUM CHLORIDE (POUR BTL) OPTIME
TOPICAL | Status: DC | PRN
  Administered 2018-01-14: 1000 mL

## 2018-01-14 MED ORDER — MEPERIDINE HCL 50 MG/ML IJ SOLN: 6.2500 mg | INTRAMUSCULAR | Status: DC | PRN

## 2018-01-14 MED ORDER — EPHEDRINE SULFATE 50 MG/ML IJ SOLN
INTRAMUSCULAR | Status: AC
  Filled 2018-01-14: qty 1

## 2018-01-14 MED ORDER — ROCURONIUM BROMIDE 100 MG/10ML IV SOLN
INTRAVENOUS | Status: DC | PRN
  Administered 2018-01-14: 20 mg via INTRAVENOUS
  Administered 2018-01-14: 10 mg via INTRAVENOUS

## 2018-01-14 MED ORDER — LIDOCAINE HCL (CARDIAC) PF 100 MG/5ML IV SOSY
PREFILLED_SYRINGE | INTRAVENOUS | Status: DC | PRN
  Administered 2018-01-14: 30 mg via INTRAVENOUS

## 2018-01-14 MED ORDER — BUPIVACAINE HCL (PF) 0.5 % IJ SOLN
INTRAMUSCULAR | Status: DC | PRN
  Administered 2018-01-14: 15 mL

## 2018-01-14 MED ORDER — OXYCODONE HCL 5 MG PO TABS: 5.0000 mg | ORAL_TABLET | ORAL | 0 refills | Status: DC | PRN

## 2018-01-14 MED ORDER — DOCUSATE SODIUM 100 MG PO CAPS: 100.0000 mg | ORAL_CAPSULE | Freq: Two times a day (BID) | ORAL | 2 refills | Status: DC | PRN

## 2018-01-14 MED ORDER — HYDROCODONE-ACETAMINOPHEN 7.5-325 MG PO TABS: 1.0000 | ORAL_TABLET | Freq: Once | ORAL | Status: DC | PRN

## 2018-01-14 MED ORDER — BUPIVACAINE HCL (PF) 0.5 % IJ SOLN
INTRAMUSCULAR | Status: AC
  Filled 2018-01-14: qty 30

## 2018-01-14 MED ORDER — FENTANYL CITRATE (PF) 250 MCG/5ML IJ SOLN
INTRAMUSCULAR | Status: AC
  Filled 2018-01-14: qty 5

## 2018-01-14 MED ORDER — ONDANSETRON HCL 4 MG/2ML IJ SOLN
INTRAMUSCULAR | Status: DC | PRN
  Administered 2018-01-14: 4 mg via INTRAVENOUS

## 2018-01-14 MED ORDER — LIDOCAINE HCL (PF) 1 % IJ SOLN
INTRAMUSCULAR | Status: AC
  Filled 2018-01-14: qty 5

## 2018-01-14 MED ORDER — PROMETHAZINE HCL 25 MG/ML IJ SOLN
12.5000 mg | Freq: Four times a day (QID) | INTRAMUSCULAR | Status: DC | PRN
  Administered 2018-01-14 – 2018-01-15 (×2): 12.5 mg via INTRAVENOUS
  Filled 2018-01-14 (×2): qty 1

## 2018-01-14 MED ORDER — FENTANYL CITRATE (PF) 100 MCG/2ML IJ SOLN
INTRAMUSCULAR | Status: DC | PRN
  Administered 2018-01-14 (×6): 50 ug via INTRAVENOUS

## 2018-01-14 MED ORDER — LABETALOL HCL 5 MG/ML IV SOLN
INTRAVENOUS | Status: DC | PRN
  Administered 2018-01-14: 5 mg via INTRAVENOUS

## 2018-01-14 MED ORDER — HEMOSTATIC AGENTS (NO CHARGE) OPTIME
TOPICAL | Status: DC | PRN
  Administered 2018-01-14: 2 via TOPICAL

## 2018-01-14 MED ORDER — ONDANSETRON HCL 4 MG/2ML IJ SOLN: 4.0000 mg | Freq: Once | INTRAMUSCULAR | Status: DC | PRN

## 2018-01-14 MED ORDER — FENTANYL CITRATE (PF) 100 MCG/2ML IJ SOLN
INTRAMUSCULAR | Status: AC
  Filled 2018-01-14: qty 2

## 2018-01-14 MED ORDER — PROPOFOL 10 MG/ML IV BOLUS
INTRAVENOUS | Status: AC
  Filled 2018-01-14: qty 40

## 2018-01-14 MED ORDER — HYDROMORPHONE HCL 1 MG/ML IJ SOLN
0.2500 mg | INTRAMUSCULAR | Status: DC | PRN
  Administered 2018-01-14 (×4): 0.5 mg via INTRAVENOUS
  Filled 2018-01-14 (×2): qty 0.5

## 2018-01-14 MED ORDER — KETOROLAC TROMETHAMINE 30 MG/ML IJ SOLN
30.0000 mg | Freq: Once | INTRAMUSCULAR | Status: AC | PRN
  Administered 2018-01-14: 30 mg via INTRAVENOUS
  Filled 2018-01-14: qty 1

## 2018-01-14 MED ORDER — MIDAZOLAM HCL 2 MG/2ML IJ SOLN
INTRAMUSCULAR | Status: AC
  Filled 2018-01-14: qty 2

## 2018-01-14 MED ORDER — SUGAMMADEX SODIUM 500 MG/5ML IV SOLN
INTRAVENOUS | Status: DC | PRN
  Administered 2018-01-14: 216 mg via INTRAVENOUS

## 2018-01-14 SURGICAL SUPPLY — 49 items
ADH SKN CLS APL DERMABOND .7 (GAUZE/BANDAGES/DRESSINGS) ×1
APPLIER CLIP ROT 10 11.4 M/L (STAPLE) ×2
APR CLP MED LRG 11.4X10 (STAPLE) ×1
BAG RETRIEVAL 10 (BASKET) ×1
BLADE SURG 15 STRL LF DISP TIS (BLADE) ×1 IMPLANT
BLADE SURG 15 STRL SS (BLADE) ×2
CHLORAPREP W/TINT 26ML (MISCELLANEOUS) ×2 IMPLANT
CLIP APPLIE ROT 10 11.4 M/L (STAPLE) ×1 IMPLANT
CLOTH BEACON ORANGE TIMEOUT ST (SAFETY) ×2 IMPLANT
COVER LIGHT HANDLE STERIS (MISCELLANEOUS) ×4 IMPLANT
CUTTER FLEX LINEAR 45M (STAPLE) ×1 IMPLANT
DECANTER SPIKE VIAL GLASS SM (MISCELLANEOUS) ×2 IMPLANT
DERMABOND ADVANCED (GAUZE/BANDAGES/DRESSINGS) ×1
DERMABOND ADVANCED .7 DNX12 (GAUZE/BANDAGES/DRESSINGS) ×1 IMPLANT
ELECT REM PT RETURN 9FT ADLT (ELECTROSURGICAL) ×2
ELECTRODE REM PT RTRN 9FT ADLT (ELECTROSURGICAL) ×1 IMPLANT
FILTER SMOKE EVAC LAPAROSHD (FILTER) ×2 IMPLANT
GLOVE BIO SURGEON STRL SZ 6.5 (GLOVE) ×3 IMPLANT
GLOVE BIO SURGEON STRL SZ7 (GLOVE) ×1 IMPLANT
GLOVE BIOGEL PI IND STRL 6.5 (GLOVE) ×1 IMPLANT
GLOVE BIOGEL PI IND STRL 7.0 (GLOVE) ×3 IMPLANT
GLOVE BIOGEL PI INDICATOR 6.5 (GLOVE) ×2
GLOVE BIOGEL PI INDICATOR 7.0 (GLOVE) ×3
GOWN STRL REUS W/TWL LRG LVL3 (GOWN DISPOSABLE) ×6 IMPLANT
HEMOSTAT SNOW SURGICEL 2X4 (HEMOSTASIS) ×3 IMPLANT
INST SET LAPROSCOPIC AP (KITS) ×2 IMPLANT
IV NS IRRIG 3000ML ARTHROMATIC (IV SOLUTION) IMPLANT
KIT TURNOVER KIT A (KITS) ×2 IMPLANT
MANIFOLD NEPTUNE II (INSTRUMENTS) ×2 IMPLANT
NDL INSUFFLATION 14GA 120MM (NEEDLE) ×1 IMPLANT
NEEDLE INSUFFLATION 14GA 120MM (NEEDLE) ×2 IMPLANT
NS IRRIG 1000ML POUR BTL (IV SOLUTION) ×2 IMPLANT
PACK LAP CHOLE LZT030E (CUSTOM PROCEDURE TRAY) ×2 IMPLANT
PAD ARMBOARD 7.5X6 YLW CONV (MISCELLANEOUS) ×2 IMPLANT
RELOAD 45 VASCULAR/THIN (ENDOMECHANICALS) ×2 IMPLANT
RELOAD STAPLE 45 2.5 WHT GRN (ENDOMECHANICALS) IMPLANT
SET BASIN LINEN APH (SET/KITS/TRAYS/PACK) ×2 IMPLANT
SET TUBE IRRIG SUCTION NO TIP (IRRIGATION / IRRIGATOR) IMPLANT
SLEEVE ENDOPATH XCEL 5M (ENDOMECHANICALS) ×2 IMPLANT
SUT MNCRL AB 4-0 PS2 18 (SUTURE) ×2 IMPLANT
SUT VICRYL 0 UR6 27IN ABS (SUTURE) ×2 IMPLANT
SYS BAG RETRIEVAL 10MM (BASKET) ×1
SYSTEM BAG RETRIEVAL 10MM (BASKET) ×1 IMPLANT
TROCAR ENDO BLADELESS 11MM (ENDOMECHANICALS) ×2 IMPLANT
TROCAR XCEL NON-BLD 5MMX100MML (ENDOMECHANICALS) ×2 IMPLANT
TROCAR XCEL UNIV SLVE 11M 100M (ENDOMECHANICALS) ×2 IMPLANT
TUBE CONNECTING 12X1/4 (SUCTIONS) ×2 IMPLANT
TUBING INSUFFLATION (TUBING) ×2 IMPLANT
WARMER LAPAROSCOPE (MISCELLANEOUS) ×2 IMPLANT

## 2018-01-14 NOTE — Progress Notes (Signed)
[facility]  Patient s/p lap chole for acute cholecystitis.  Known diastasis of the epigastric area. Abdomen protrudes about the size of a cantaloupe in the upper midline. This is not a true hernia. This is unchanged from preop.  She does have a hernia in the umbilicus but this is inferior to the port sites.    Patient can discharge home once tolerating diet, pain controlled.  Can follow up in 2 weeks. Wrote Rx for Roxicodone an Colace that went to the Pharmacy.  Updated Dr. Jerilee Hoh. Appreciate assistance.   Curlene Labrum, MD [facility] [address] [place], [place] [address] [phone] (office)

## 2018-01-14 NOTE — Transfer of Care (Signed)
Immediate Anesthesia Transfer of Care Note  Patient: Monique Weiss  Procedure(s) Performed: LAPAROSCOPIC CHOLECYSTECTOMY (N/A Abdomen)  Patient Location: PACU  Anesthesia Type:General  Level of Consciousness: oriented, drowsy and patient cooperative  Airway & Oxygen Therapy: Patient Spontanous Breathing and Patient connected to face mask oxygen  Post-op Assessment: Report given to RN and Post -op Vital signs reviewed and stable  Post vital signs: Reviewed and stable  Last Vitals:  Vitals Value Taken Time  BP    Temp    Pulse 75 01/14/2018 12:07 PM  Resp 13 01/14/2018 12:07 PM  SpO2 100 % 01/14/2018 12:07 PM  Vitals shown include unvalidated device data.  Last Pain:  Vitals:   01/14/18 0909  TempSrc:   PainSc: 0-No pain      Patients Stated Pain Goal: 5 (33/00/76 2263)  Complications: No apparent anesthesia complications

## 2018-01-14 NOTE — Discharge Instructions (Signed)
Discharge Instructions: Shower per your regular routine. Take tylenol and ibuprofen as needed for pain control, alternating every 4-6 hours.  Take Roxicodone for breakthrough pain. Take colace for constipation related to narcotic pain medication. Do not pick at the dermabond glue on your incision sites.    Laparoscopic Cholecystectomy, Care After This sheet gives you information about how to care for yourself after your procedure. Your doctor may also give you more specific instructions. If you have problems or questions, contact your doctor. Follow these instructions at home: Care for cuts from surgery (incisions)   Follow instructions from your doctor about how to take care of your cuts from surgery. Make sure you: ? Wash your hands with soap and water before you change your bandage (dressing). If you cannot use soap and water, use hand sanitizer. ? Change your bandage as told by your doctor. ? Leave stitches (sutures), skin glue, or skin tape (adhesive) strips in place. They may need to stay in place for 2 weeks or longer. If tape strips get loose and curl up, you may trim the loose edges. Do not remove tape strips completely unless your doctor says it is okay.  Do not take baths, swim, or use a hot tub until your doctor says it is okay.  Check your surgical cut area every day for signs of infection. Check for: ? More redness, swelling, or pain. ? More fluid or blood. ? Warmth. ? Pus or a bad smell. Activity  Do not drive or use heavy machinery while taking prescription pain medicine.  Do not lift anything that is heavier than 10 lb (4.5 kg) until your doctor says it is okay.  Do not play contact sports until your doctor says it is okay.  Do not drive for 24 hours if you were given a medicine to help you relax (sedative).  Rest as needed. Do not return to work or school until your doctor says it is okay. General instructions  Take over-the-counter and prescription medicines  only as told by your doctor.  To prevent or treat constipation while you are taking prescription pain medicine, your doctor may recommend that you: ? Drink enough fluid to keep your pee (urine) clear or pale yellow. ? Take over-the-counter or prescription medicines. ? Eat foods that are high in fiber, such as fresh fruits and vegetables, whole grains, and beans. ? Limit foods that are high in fat and processed sugars, such as fried and sweet foods. Contact a doctor if:  You develop a rash.  You have more redness, swelling, or pain around your surgical cuts.  You have more fluid or blood coming from your surgical cuts.  Your surgical cuts feel warm to the touch.  You have pus or a bad smell coming from your surgical cuts.  You have a fever.  One or more of your surgical cuts breaks open. Get help right away if:  You have trouble breathing.  You have chest pain.  You have pain that is getting worse in your shoulders.  You faint or feel dizzy when you stand.  You have very bad pain in your belly (abdomen).  You are sick to your stomach (nauseous) for more than one day.  You have throwing up (vomiting) that lasts for more than one day.  You have leg pain. This information is not intended to replace advice given to you by your health care provider. Make sure you discuss any questions you have with your health care provider. Document Released:  03/19/2008 Document Revised: 12/30/2015 Document Reviewed: 11/27/2015 Elsevier Interactive Patient Education  [employer].

## 2018-01-14 NOTE — Progress Notes (Signed)
[facility]  Nauseated and some vomiting after surgery.  Likely from anesthesia. Has had similar problems in past per family.   Diet as tolerated. Zofran/ phenergan as needed Needs to have BM, had colace added yesterday and miralax once.  Will see tomorrow.  Curlene Labrum, MD [facility] [address] [place], [place] [address] [phone] (office)

## 2018-01-14 NOTE — Interval H&P Note (Signed)
History and Physical Interval Note:  01/14/2018 9:37 AM  Monique Weiss  has presented today for surgery, with the diagnosis of cholecystitis  The various methods of treatment have been discussed with the patient and family. After consideration of risks, benefits and other options for treatment, the patient has consented to  Procedure(s): LAPAROSCOPIC CHOLECYSTECTOMY (N/A) as a surgical intervention .  The patient's history has been reviewed, patient examined, no change in status, stable for surgery.  I have reviewed the patient's chart and labs.  Questions were answered to the patient's satisfaction.    No additional questions. All labs normalized. Pain improved.  Virl Cagey

## 2018-01-14 NOTE — Op Note (Addendum)
Operative Note   Preoperative Diagnosis: Acute cholecystitis    Postoperative Diagnosis: Acute cholecystitis with hydrops    Procedure(s) Performed: Laparoscopic cholecystectomy   Surgeon: Lanell Matar. Constance Haw, MD   Assistants: No qualified resident was available   Anesthesia: General endotracheal   Anesthesiologist: Dr. Rick Duff   Specimens: Gallbladder    Estimated Blood Loss: Minimal    Blood Replacement: None    Complications: None    Operative Findings:  Inflamed gallbladder with hydrops    Procedure: The patient was taken to the operating room and placed supine. General endotracheal anesthesia was induced. Intravenous antibiotics were administered per protocol. An orogastric tube positioned to decompress the stomach. The abdomen was prepared and draped in the usual sterile fashion.    The patient has a large area of diastasis in the epigastric region with a component of hernia near the umbilicus. Due to her pannus size, the supraumbilical incision is in the area of diastasis. A supraumbilical incision was made and a Veress technique was utilized to achieve pneumoperitoneum to 15 mmHg with carbon dioxide. A 11 mm optiview port was placed through the supraumbilical region, and a 10 mm 0-degree operative laparoscope was introduced. The area underlying the trocar and Veress needle were inspected and without evidence of injury.  Remaining trocars were placed under direct vision. Two 5 mm ports were placed in the right abdomen, between the anterior axillary and midclavicular line.  A final 11 mm port was placed through the mid-epigastrium, near the falciform ligament.  Omental adhesions were taken down bluntly to expose the gallbladder.   The gallbladder was distended and had to be decompressed. Hydrops clear fluid was removed from the gallbladder along with some purulence.  The gallbladder with inflamed.    The gallbladder fundus was elevated cephalad and the infundibulum was retracted to  the patient's right. The gallbladder/cystic duct junction was skeletonized. The cystic artery noted in the triangle of Calot and was also skeletonized.  We then continued liberal medial and lateral dissection until the critical view of safety was achieved.    The cystic duct was very friable and thick. Clips were closing on it but  I was afraid they would fall off, so a vascular endoGIA stapler was used to come across the cystic duct. The cystic artery was doubly clipped and divided. The gallbladder was then dissected from the liver bed with electrocautery. The specimen was placed in an Endopouch and was retrieved through the epigastric site. The epigastric site did have to be enlarged to remove the gallbladder. Some active muscle bleeding was controlled with cautery.    Final inspection revealed acceptable hemostasis. Surgical Emogene Morgan was placed in the gallbladder bed. 0 Vicryl fascial sutures were used to close the epigastric and umbilical port sites. Trocars were removed and pneumoperitoneum was released. Skin incisions were closed with 4-0 Monocryl subcuticular sutures and Dermabond. The patient was awakened from anesthesia and extubated without complication.    Curlene Labrum, MD [facility] [address] [place], [place] [address] [phone] (office)

## 2018-01-14 NOTE — Progress Notes (Signed)
PROGRESS NOTE    Monique Weiss  QXI:[id number] DOB: March 17, 1956 DOA: 01/12/2018 PCP: Asencion Noble, MD     Brief Narrative:  62 year old woman admitted from home on 7/22 with complaints of right-sided abdominal pain that began 3 days prior to admission.  She has a past medical history significant for hyperlipidemia, hypothyroidism and diabetes.  Right upper quadrant ultrasound showed a distended gallbladder with a positive Murphy sign.  General surgery was consulted.  Patient had cholecystectomy on 7/24.   Assessment & Plan:   Principal Problem:   Acute cholecystitis Active Problems:   UTI (urinary tract infection)   Hyperlipidemia   Anxiety   Atelectasis of lingula   Diastasis recti   Acute cholecystitis -Status post laparoscopic cholecystectomy on 7/24. -Continue Zosyn for now. -Plan was to discharge this afternoon, however patient has had significant nausea and vomiting likely related to anesthesia. -Continue to follow overnight with hopeful discharge home in a.m.  Hyperlipidemia -Restart Lipitor once able to tolerate p.o. -Hypothyroidism -On Synthroid  Generalized anxiety disorder -On sertraline 50 mg daily.  Type 2 diabetes -Well-controlled, metformin on hold.   DVT prophylaxis: SCDs Code Status: Full code Family Communication: Multiple family members at bedside updated on plan of care and all questions answered Disposition Plan: Hopeful for discharge home in a.m.  Consultants:   General surgery  Procedures:   Laparoscopic cholecystectomy on 7/24  Antimicrobials:  Anti-infectives (From admission, onward)   Start     Dose/Rate Route Frequency Ordered Stop   01/14/18 0915  cefoTEtan (CEFOTAN) 2 g in sodium chloride 0.9 % 100 mL IVPB     2 g 200 mL/hr over 30 Minutes Intravenous On call to O.R. 01/14/18 0906 01/14/18 1044   01/13/18 0200  piperacillin-tazobactam (ZOSYN) IVPB 3.375 g     3.375 g 12.5 mL/hr over 240 Minutes Intravenous Every 8 hours  01/12/18 2105     01/12/18 1715  piperacillin-tazobactam (ZOSYN) IVPB 3.375 g     3.375 g 100 mL/hr over 30 Minutes Intravenous  Once 01/12/18 1706 01/12/18 1956       Subjective: Lying in bed, eyes closed, significant nausea.  Emesis earlier today  Objective: Vitals:   01/14/18 1300 01/14/18 1315 01/14/18 1338 01/14/18 1535  BP: 119/84  (!) 111/59 107/70  Pulse: 75 75 72 80  Resp: 10 12 16 18   Temp:   97.9 F (36.6 C) 98 F (36.7 C)  TempSrc:   Oral Oral  SpO2: 93% 94% 94% 100%  Weight:      Height:        Intake/Output Summary (Last 24 hours) at 01/14/2018 1835 Last data filed at 01/14/2018 1700 Gross per 24 hour  Intake 3900 ml  Output 20 ml  Net 3880 ml   Filed Weights   01/12/18 1215  Weight: 108 kg (238 lb)    Examination:  General exam: Alert, awake, oriented x 3 Respiratory system: Clear to auscultation. Respiratory effort normal. Cardiovascular system:RRR. No murmurs, rubs, gallops. Gastrointestinal system: Abdomen is nondistended, soft and nontender. No organomegaly or masses felt. Normal bowel sounds heard. Central nervous system: Alert and oriented. No focal neurological deficits. Extremities: No C/C/E, +pedal pulses Skin: No rashes, lesions or ulcers Psychiatry: Judgement and insight appear normal. Mood & affect appropriate.     Data Reviewed: I have personally reviewed following labs and imaging studies  CBC: Recent Labs  Lab 01/12/18 1255 01/13/18 0449 01/14/18 0406  WBC 18.8* 11.7* 8.4  NEUTROABS  --  7.6 5.1  HGB 15.5*  12.9 12.9  HCT 46.0 40.1 40.1  MCV 93.9 95.2 95.7  PLT 160 146* 401   Basic Metabolic Panel: Recent Labs  Lab 01/12/18 1255 01/13/18 0449 01/14/18 0406  NA 139 140 141  K 3.9 3.9 3.9  CL 98 98 102  CO2 29 34* 33*  GLUCOSE 144* 114* 119*  BUN 14 16 9   CREATININE 0.76 0.88 0.61  CALCIUM 9.5 8.7* 8.8*   GFR: Estimated Creatinine Clearance: 94.5 mL/min (by C-G formula based on SCr of 0.61 mg/dL). Liver  Function Tests: Recent Labs  Lab 01/12/18 1255 01/13/18 0449 01/14/18 0406  AST 19 12* 13*  ALT 20 16 15   ALKPHOS 69 61 56  BILITOT 1.3* 1.2 0.7  PROT 7.5 6.3* 6.5  ALBUMIN 4.6 3.8 3.8   Recent Labs  Lab 01/12/18 1255  LIPASE 28   No results for input(s): AMMONIA in the last 168 hours. Coagulation Profile: No results for input(s): INR, PROTIME in the last 168 hours. Cardiac Enzymes: No results for input(s): CKTOTAL, CKMB, CKMBINDEX, TROPONINI in the last 168 hours. BNP (last 3 results) No results for input(s): PROBNP in the last 8760 hours. HbA1C: No results for input(s): HGBA1C in the last 72 hours. CBG: Recent Labs  Lab 01/13/18 2155 01/14/18 0802 01/14/18 0858 01/14/18 1207 01/14/18 1648  GLUCAP 116* 124* 123* 160* 129*   Lipid Profile: No results for input(s): CHOL, HDL, LDLCALC, TRIG, CHOLHDL, LDLDIRECT in the last 72 hours. Thyroid Function Tests: No results for input(s): TSH, T4TOTAL, FREET4, T3FREE, THYROIDAB in the last 72 hours. Anemia Panel: No results for input(s): VITAMINB12, FOLATE, FERRITIN, TIBC, IRON, RETICCTPCT in the last 72 hours. Urine analysis:    Component Value Date/Time   COLORURINE AMBER (A) 01/12/2018 1217   APPEARANCEUR CLOUDY (A) 01/12/2018 1217   LABSPEC 1.014 01/12/2018 1217   PHURINE 5.0 01/12/2018 1217   GLUCOSEU NEGATIVE 01/12/2018 1217   HGBUR NEGATIVE 01/12/2018 1217   BILIRUBINUR NEGATIVE 01/12/2018 1217   KETONESUR NEGATIVE 01/12/2018 1217   PROTEINUR 30 (A) 01/12/2018 1217   NITRITE NEGATIVE 01/12/2018 1217   LEUKOCYTESUR LARGE (A) 01/12/2018 1217   Sepsis Labs: @LABRCNTIP (procalcitonin:4,lacticidven:4)  ) Recent Results (from the past 240 hour(s))  Urine culture     Status: Abnormal (Preliminary result)   Collection Time: 01/12/18 12:17 PM  Result Value Ref Range Status   Specimen Description   Final    URINE, CLEAN CATCH Performed at [facility], [address] [place], [place] [address]    Special  Requests   Final    NONE Performed at [facility], [address], [place], [place] [address]    Culture (A)  Final    >=100,000 COLONIES/mL KLEBSIELLA PNEUMONIAE SUSCEPTIBILITIES TO FOLLOW Performed at [facility], [place] [address], [place],  [address]    Report Status PENDING  Incomplete         Radiology Studies: Dg Chest Port 1 View  Result Date: 01/12/2018 CLINICAL DATA:  Epigastric pain last week with diagnosis of gallstones. Surgery tomorrow. History of diabetes. EXAM: PORTABLE CHEST 1 VIEW COMPARISON:  10/03/2017 FINDINGS: Shallow inspiration with linear atelectasis in the lung bases. Heart size and pulmonary vascularity are normal. No focal consolidation or airspace disease. No blunting of costophrenic angles. No pneumothorax. Mediastinal contours appear intact. IMPRESSION: Shallow inspiration with linear atelectasis in the lung bases. Electronically Signed   By: Lucienne Capers M.D.   On: 01/12/2018 22:22        Scheduled Meds: . docusate sodium  100 mg Oral BID  . insulin aspart  0-9 Units Subcutaneous TID WC  . levothyroxine  75 mcg Oral QAC breakfast  . polyethylene glycol  17 g Oral Daily  . sertraline  50 mg Oral Daily   Continuous Infusions: . sodium chloride 100 mL/hr at 01/13/18 1748  . famotidine (PEPCID) IV 20 mg (01/14/18 0818)  . piperacillin-tazobactam 3.375 g (01/14/18 1810)     LOS: 2 days    Time spent: 25 minutes.     Lelon Frohlich, MD [facility] Pager [phone]  If 7PM-7AM, please contact night-coverage [email or web address] Password [facility] 01/14/2018, 6:35 PM

## 2018-01-14 NOTE — Anesthesia Postprocedure Evaluation (Signed)
Anesthesia Post Note  Patient: Monique Weiss  Procedure(s) Performed: LAPAROSCOPIC CHOLECYSTECTOMY (N/A Abdomen)  Patient location during evaluation: PACU Anesthesia Type: General Level of consciousness: awake and alert and oriented Pain management: pain level controlled Vital Signs Assessment: post-procedure vital signs reviewed and stable Respiratory status: spontaneous breathing Cardiovascular status: stable Postop Assessment: no apparent nausea or vomiting Anesthetic complications: no     Last Vitals:  Vitals:   01/14/18 1206 01/14/18 1215  BP: 132/78 133/77  Pulse: 84 75  Resp: 13 17  Temp: 37.3 C   SpO2: (!) 89% 100%    Last Pain:  Vitals:   01/14/18 1206  TempSrc:   PainSc: 10-Worst pain ever                 Sidra Oldfield A

## 2018-01-14 NOTE — Anesthesia Preprocedure Evaluation (Signed)
Anesthesia Evaluation  Patient identified by MRN, date of birth, ID band Patient awake    Reviewed: Allergy & Precautions, H&P , NPO status , Patient's Chart, lab work & pertinent test results  Airway Mallampati: III  TM Distance: >3 FB Neck ROM: limited    Dental no notable dental hx.    Pulmonary neg pulmonary ROS,    Pulmonary exam normal breath sounds clear to auscultation       Cardiovascular Exercise Tolerance: Good hypertension, negative cardio ROS   Rhythm:regular Rate:Normal     Neuro/Psych Anxiety negative neurological ROS  negative psych ROS   GI/Hepatic negative GI ROS, Neg liver ROS,   Endo/Other  negative endocrine ROSdiabetesMorbid obesity  Renal/GU negative Renal ROS  negative genitourinary   Musculoskeletal   Abdominal   Peds  Hematology negative hematology ROS (+)   Anesthesia Other Findings No history of difficult intubation per patient, but may be difficult per exam.    Reproductive/Obstetrics negative OB ROS                             Anesthesia Physical Anesthesia Plan  ASA: III  Anesthesia Plan: General   Post-op Pain Management:    Induction:   PONV Risk Score and Plan:   Airway Management Planned:   Additional Equipment:   Intra-op Plan:   Post-operative Plan:   Informed Consent: I have reviewed the patients History and Physical, chart, labs and discussed the procedure including the risks, benefits and alternatives for the proposed anesthesia with the patient or authorized representative who has indicated his/her understanding and acceptance.   Dental Advisory Given  Plan Discussed with: CRNA  Anesthesia Plan Comments:         Anesthesia Quick Evaluation

## 2018-01-14 NOTE — Anesthesia Procedure Notes (Signed)
Procedure Name: Intubation Date/Time: 01/14/2018 10:43 AM Performed by: Andree Elk, Zyden Suman A, CRNA Pre-anesthesia Checklist: Patient identified, Patient being monitored, Timeout performed, Emergency Drugs available and Suction available Patient Re-evaluated:Patient Re-evaluated prior to induction Oxygen Delivery Method: Circle System Utilized Preoxygenation: Pre-oxygenation with 100% oxygen Induction Type: IV induction Laryngoscope Size: Glidescope and 3 Grade View: Grade I Tube type: Oral Tube size: 7.0 mm Number of attempts: 1 Airway Equipment and Method: Stylet Placement Confirmation: ETT inserted through vocal cords under direct vision,  positive ETCO2 and breath sounds checked- equal and bilateral Secured at: 21 cm Tube secured with: Tape Dental Injury: Teeth and Oropharynx as per pre-operative assessment

## 2018-01-15 ENCOUNTER — Encounter (HOSPITAL_COMMUNITY): Payer: Self-pay | Admitting: General Surgery

## 2018-01-15 LAB — URINE CULTURE: Culture: >=100000 — AB

## 2018-01-15 LAB — GLUCOSE, CAPILLARY
Glucose-Capillary: 143 mg/dL — ABNORMAL HIGH (ref 70–99)
Glucose-Capillary: 130 mg/dL — ABNORMAL HIGH (ref 70–99)

## 2018-01-15 LAB — BASIC METABOLIC PANEL
Anion gap: 7 (ref 5–15)
BUN: 12 mg/dL (ref 8–23)
CO2: 31 mmol/L (ref 22–32)
Calcium: 8.4 mg/dL — ABNORMAL LOW (ref 8.9–10.3)
Chloride: 104 mmol/L (ref 98–111)
Creatinine, Ser: 0.67 mg/dL (ref 0.44–1.00)
GFR calc Af Amer: >60 mL/min (ref >60)
GFR calc non Af Amer: >60 mL/min (ref >60)
Glucose, Bld: 128 mg/dL — ABNORMAL HIGH (ref 70–99)
Potassium: 4.1 mmol/L (ref 3.5–5.1)
Sodium: 142 mmol/L (ref 135–145)

## 2018-01-15 LAB — HEPATIC FUNCTION PANEL
ALT: 31 U/L (ref 0–44)
AST: 28 U/L (ref 15–41)
Albumin: 3.3 g/dL — ABNORMAL LOW (ref 3.5–5.0)
Alkaline Phosphatase: 50 U/L (ref 38–126)
Bilirubin, Direct: 0.1 mg/dL (ref 0.0–0.2)
Indirect Bilirubin: 0.5 mg/dL (ref 0.3–0.9)
Total Bilirubin: 0.6 mg/dL (ref 0.3–1.2)
Total Protein: 6 g/dL — ABNORMAL LOW (ref 6.5–8.1)

## 2018-01-15 LAB — CBC WITH DIFFERENTIAL/PLATELET
Basophils Absolute: 0 10*3/uL (ref 0.0–0.1)
Basophils Relative: 0 %
Eosinophils Absolute: 0.1 10*3/uL (ref 0.0–0.7)
Eosinophils Relative: 1 %
HCT: 37.9 % (ref 36.0–46.0)
Hemoglobin: 12 g/dL (ref 12.0–15.0)
Lymphocytes Relative: 16 %
Lymphs Abs: 2 10*3/uL (ref 0.7–4.0)
MCH: 31 pg (ref 26.0–34.0)
MCHC: 31.7 g/dL (ref 30.0–36.0)
MCV: 97.9 fL (ref 78.0–100.0)
Monocytes Absolute: 0.9 10*3/uL (ref 0.1–1.0)
Monocytes Relative: 7 %
Neutro Abs: 9.5 10*3/uL — ABNORMAL HIGH (ref 1.7–7.7)
Neutrophils Relative %: 76 %
Platelets: 179 10*3/uL (ref 150–400)
RBC: 3.87 MIL/uL (ref 3.87–5.11)
RDW: 12.7 % (ref 11.5–15.5)
WBC: 12.5 10*3/uL — ABNORMAL HIGH (ref 4.0–10.5)

## 2018-01-15 MED ORDER — ONDANSETRON 4 MG PO TBDP: 4.0000 mg | ORAL_TABLET | Freq: Three times a day (TID) | ORAL | 0 refills | Status: DC | PRN

## 2018-01-15 NOTE — Progress Notes (Signed)
[facility] Progress Note  1 Day Post-Op  Subjective: Still with nausea/ has not eaten breakfast. No BM but one is recorded?   Objective: Vital signs in last 24 hours: Temp:  [97.9 F (36.6 C)-98.4 F (36.9 C)] 98.4 F (36.9 C) (07/25 7824) Pulse Rate:  [72-97] 77 (07/25 0608) Resp:  [16-18] 16 (07/25 0608) BP: (107-115)/(59-76) 110/73 (07/25 2353) SpO2:  [94 %-100 %] 97 % (07/25 6144) Last BM Date: 01/11/18  Intake/Output from previous day: 07/24 0701 - 07/25 0700 In: 4260 [P.O.:360; I.V.:3900] Out: 23 [Urine:1; Emesis/NG output:2; Blood:20] Intake/Output this shift: Total I/O In: 240 [P.O.:240] Out: -   General appearance: alert, cooperative and mild distress Resp: normal work breathing GI: soft, mildly distended, diastasis in the epigastric area, port site with bruising, no erythema or drainage  Lab Results:  Recent Labs    01/14/18 0406 01/15/18 0434  WBC 8.4 12.5*  HGB 12.9 12.0  HCT 40.1 37.9  PLT 170 179   BMET Recent Labs    01/14/18 0406 01/15/18 0434  NA 141 142  K 3.9 4.1  CL 102 104  CO2 33* 31  GLUCOSE 119* 128*  BUN 9 12  CREATININE 0.61 0.67  CALCIUM 8.8* 8.4*   Anti-infectives: Anti-infectives (From admission, onward)   Start     Dose/Rate Route Frequency Ordered Stop   01/14/18 0915  cefoTEtan (CEFOTAN) 2 g in sodium chloride 0.9 % 100 mL IVPB     2 g 200 mL/hr over 30 Minutes Intravenous On call to O.R. 01/14/18 0906 01/14/18 1044   01/13/18 0200  piperacillin-tazobactam (ZOSYN) IVPB 3.375 g     3.375 g 12.5 mL/hr over 240 Minutes Intravenous Every 8 hours 01/12/18 2105     01/12/18 1715  piperacillin-tazobactam (ZOSYN) IVPB 3.375 g     3.375 g 100 mL/hr over 30 Minutes Intravenous  Once 01/12/18 1706 01/12/18 1956      Assessment/Plan: Monique Weiss is a 62 yo POD 1 s/p lap cholecystectomy for acute cholecystitis. Having some post operative nausea likely related to surgery.  -PRN for pain -IS, OOB -Diet as  tolerated, may need some scheduled zofran ODT and some to go home with -No need for any further antibiotics for cholecystitis  -Can d/c home once tolerated diet -Follow up in office    LOS: 3 days    Monique Weiss 01/15/2018

## 2018-01-15 NOTE — Progress Notes (Signed)
Discharged home with instructions given on medications and follow up visits,patient verbalized understanding. Prescriptions sent to Pharmacy of choice documented on AVS. Accompanied by staff to an awaiting vehicle. 

## 2018-01-15 NOTE — Discharge Summary (Signed)
Physician Discharge Summary  Monique Weiss JAS:[id number] DOB: 1956-02-04 DOA: 01/12/2018  PCP: Asencion Noble, MD  Admit date: 01/12/2018 Discharge date: 01/15/2018  Time spent: 45 minutes  Recommendations for Outpatient Follow-up:  -Will be discharged home today. -F/u with Dr. Constance Haw as scheduled in 2 weeks.   Discharge Diagnoses:  Principal Problem:   Acute cholecystitis Active Problems:   UTI (urinary tract infection)   Hyperlipidemia   Anxiety   Atelectasis of lingula   Diastasis recti   Discharge Condition: Stable and improved  Filed Weights   01/12/18 1215  Weight: 108 kg (238 lb)    History of present illness:  As per Dr. Olevia Bowens on 7/22: Monique Weiss is a 62 y.o. female with medical history significant of satiety, type 2 diabetes, hyperlipidemia, hypothyroidism who is coming to the emergency department referred by her PCP (Dr. Willey Blade) due to right-sided abdominal pain that has been getting progressively worse since Friday.  The patient denies vomiting, but states that she has had frequent nausea, decreased appetite, multiple episodes of diarrhea since Saturday, but particularly yesterday, the patient states that she had over 10 loose stools BMs. She states that the diarrhea has resolved today. No constipation, melena or hematochezia.  She also has experienced some mild dysuria, but denies frequency, hematuria or oliguria.  She denies fever, chills, sore throat, dyspnea, chest pain, palpitations, dizziness, diaphoresis, PND, orthopnea or pitting edema of the lower extremities.  No polyuria, polydipsia, polyphagia or blurred vision.  Denies heat or cold intolerance.  Denies pruritus or skin rashes.  ED Course: Initial vital signs in the emergency department temperature 97.9 F, pulse 108, respirations 16, blood pressure 110/71 mmHg and O2 sat 95% on room air.  She received 1 mg of hydromorphone IV x1, 4 mg of Zofran IV x1 and Zosyn IVPB.  She says she has had a partial  relief with this.  She was also seen by general surgery (Dr. Constance Haw).  Urinalysis showed large leukocyte esterase, 11-20 RBC and more than 50 WBC, few bacteria per hpf.  Urine culture and sensitivity was collected in the ED.  White count was 18.8, hemoglobin 15.5 g/dL and platelets 160.  Her CMP was normal, except for a glucose of 144 and total bilirubin of 1.3 mg/dL.  Lipase was 28 units/L.  Hospital Course:   Acute cholecystitis -Status post laparoscopic cholecystectomy on 7/24. -No need for further antibiotic therapy. -Discharge was delayed due to nausea presumably due to anesthesia that has now resolved.  Hyperlipidemia -Restart Lipitor.  Hypothyroidism -On Synthroid  Generalized anxiety disorder -On sertraline 50 mg daily.  Type 2 diabetes -Well-controlled.    Procedures:  Laparoscopic cholecystectomy on 7/24  Consultations:  General surgery, Dr. Constance Haw  Discharge Instructions  Discharge Instructions    Diet - low sodium heart healthy   Complete by:  As directed    Increase activity slowly   Complete by:  As directed      Allergies as of 01/15/2018   No Known Allergies     Medication List    STOP taking these medications   acetaminophen-codeine 300-30 MG tablet Commonly known as:  TYLENOL #3   HYDROcodone-acetaminophen 5-325 MG tablet Commonly known as:  NORCO/VICODIN     TAKE these medications   atorvastatin 40 MG tablet Commonly known as:  LIPITOR TK 1 T PO HS   cyclobenzaprine 10 MG tablet Commonly known as:  FLEXERIL cyclobenzaprine 10 mg tablet   docusate sodium 100 MG capsule Commonly known as:  COLACE Take 1 capsule (100 mg total) by mouth 2 (two) times daily as needed for mild constipation.   furosemide 40 MG tablet Commonly known as:  LASIX Take 80 mg by mouth daily.   levothyroxine 75 MCG tablet Commonly known as:  SYNTHROID, LEVOTHROID TK 1 T  PO QD   metFORMIN 1000 MG tablet Commonly known as:  GLUCOPHAGE TK 1 T PO  BID   ondansetron 4 MG disintegrating tablet Commonly known as:  ZOFRAN ODT Take 1 tablet (4 mg total) by mouth every 8 (eight) hours as needed for nausea or vomiting.   oxyCODONE 5 MG immediate release tablet Commonly known as:  Oxy IR/ROXICODONE Take 1 tablet (5 mg total) by mouth every 4 (four) hours as needed for breakthrough pain.   promethazine 12.5 MG tablet Commonly known as:  PHENERGAN TAKE 1 OR 2 TABLETS PO Q 6 H PRN NV   sertraline 50 MG tablet Commonly known as:  ZOLOFT TK 1/2 T PO QD FOR 6 DAYS THEN TK 1 T PO DAILY   spironolactone 25 MG tablet Commonly known as:  ALDACTONE spironolactone 25 mg tablet   tiZANidine 2 MG tablet Commonly known as:  ZANAFLEX TK 1 T PO BID PRF SPASM   XELJANZ XR 11 MG Tb24 Generic drug:  Tofacitinib Citrate Xeljanz XR 11 mg tablet,extended release      No Known Allergies Follow-up Information    Virl Cagey, MD In 2 weeks.   Specialty:  General Surgery Contact information: [address] [place] [facility] [address] [phone]            The results of significant diagnostics from this hospitalization (including imaging, microbiology, ancillary and laboratory) are listed below for reference.    Significant Diagnostic Studies: Ct Abdomen Pelvis W Contrast  Result Date: 01/12/2018 CLINICAL DATA:  Abdominal pain worse with palpation. EXAM: CT ABDOMEN AND PELVIS WITH CONTRAST TECHNIQUE: Multidetector CT imaging of the abdomen and pelvis was performed using the standard protocol following bolus administration of intravenous contrast. CONTRAST:  169mL ISOVUE-300 IOPAMIDOL (ISOVUE-300) INJECTION 61% COMPARISON:  Abdominal ultrasound January 12, 2018 and CT abdomen and pelvis March 19, 2010. FINDINGS: LOWER CHEST: Bibasilar atelectasis. Heart is mildly enlarged. Trace pericardial effusion. Enhancing wedge-like consolidation lingula. HEPATOBILIARY: Distended gallbladder with wall thickening, pericholecystic effusion and fat  stranding. Normal liver. PANCREAS: Normal.  Punctate peripancreatic calcification. SPLEEN: Normal. ADRENALS/URINARY TRACT: Kidneys are orthotopic, demonstrating symmetric enhancement. No nephrolithiasis, hydronephrosis or solid renal masses. The unopacified ureters are normal in course and caliber. Delayed imaging through the kidneys demonstrates symmetric prompt contrast excretion within the proximal urinary collecting system. Urinary bladder is well distended and unremarkable. Normal adrenal glands. STOMACH/BOWEL: The stomach, small and large bowel are normal in course and caliber without inflammatory changes, sensitivity decreased without oral contrast. Moderate descending and sigmoid colonic diverticulosis. The appendix is not discretely identified, however there are no inflammatory changes in the right lower quadrant. VASCULAR/LYMPHATIC: Aortoiliac vessels are normal in course and caliber. Mild calcific atherosclerosis. No lymphadenopathy by CT size criteria. REPRODUCTIVE: Normal. OTHER: No intraperitoneal free fluid or free air. MUSCULOSKELETAL: Nonacute. Anterior abdominal wall ligamentous laxity. Small fat containing umbilical hernia, suspected ventral herniorrhaphy. Sacralized LEFT L5 vertebral body. L2-3 through L5-S1 interbody disc prosthesis with arthrodesis and laminectomies. IMPRESSION: 1. Acute cholecystitis. 2. Lingular atelectasis, less likely pneumonia. Aortic Atherosclerosis (ICD10-I70.0). Electronically Signed   By: Elon Alas M.D.   On: 01/12/2018 17:01   US Abdomen Limited  Result Date: 01/12/2018 CLINICAL DATA:  Right upper  quadrant pain EXAM: ULTRASOUND ABDOMEN LIMITED RIGHT UPPER QUADRANT COMPARISON:  None. FINDINGS: Gallbladder: Distended gallbladder. No cholelithiasis. No wall thickening or pericholecystic fluid. A positive sonographic Percell Miller sign was reported by the sonographer. Common bile duct: Diameter: 4 mm Liver: Increased echogenicity of the hepatic parenchyma.  Visualization otherwise limited by body habitus and bowel gas. Portal vein is patent on color Doppler imaging with normal direction of blood flow towards the liver. IMPRESSION: 1. Distended gallbladder and positive sonographic Murphy sign, indeterminate in the absence of cholelithiasis. CT of the abdomen and pelvis might be helpful for further characterization. 2. Hepatic steatosis. 3. No biliary dilatation. Electronically Signed   By: Ulyses Jarred M.D.   On: 01/12/2018 15:43   Dg Chest Port 1 View  Result Date: 01/12/2018 CLINICAL DATA:  Epigastric pain last week with diagnosis of gallstones. Surgery tomorrow. History of diabetes. EXAM: PORTABLE CHEST 1 VIEW COMPARISON:  10/03/2017 FINDINGS: Shallow inspiration with linear atelectasis in the lung bases. Heart size and pulmonary vascularity are normal. No focal consolidation or airspace disease. No blunting of costophrenic angles. No pneumothorax. Mediastinal contours appear intact. IMPRESSION: Shallow inspiration with linear atelectasis in the lung bases. Electronically Signed   By: Lucienne Capers M.D.   On: 01/12/2018 22:22    Microbiology: Recent Results (from the past 240 hour(s))  Urine culture     Status: Abnormal   Collection Time: 01/12/18 12:17 PM  Result Value Ref Range Status   Specimen Description   Final    URINE, CLEAN CATCH Performed at [facility], [address] [place], [place] [address]    Special Requests   Final    NONE Performed at [facility], [address], [place], [place] [address]    Culture >=100,000 COLONIES/mL KLEBSIELLA PNEUMONIAE (A)  Final   Report Status 01/15/2018 FINAL  Final   Organism ID, Bacteria KLEBSIELLA PNEUMONIAE (A)  Final      Susceptibility   Klebsiella pneumoniae - MIC*    AMPICILLIN >=32 RESISTANT Resistant     CEFAZOLIN <=4 SENSITIVE Sensitive     CEFTRIAXONE <=1 SENSITIVE Sensitive     CIPROFLOXACIN <=0.25 SENSITIVE Sensitive     GENTAMICIN <=1 SENSITIVE Sensitive     IMIPENEM  <=0.25 SENSITIVE Sensitive     NITROFURANTOIN 64 INTERMEDIATE Intermediate     TRIMETH/SULFA <=20 SENSITIVE Sensitive     AMPICILLIN/SULBACTAM 4 SENSITIVE Sensitive     PIP/TAZO <=4 SENSITIVE Sensitive     Extended ESBL NEGATIVE Sensitive     * >=100,000 COLONIES/mL KLEBSIELLA PNEUMONIAE     Labs: Basic Metabolic Panel: Recent Labs  Lab 01/12/18 1255 01/13/18 0449 01/14/18 0406 01/15/18 0434  NA 139 140 141 142  K 3.9 3.9 3.9 4.1  CL 98 98 102 104  CO2 29 34* 33* 31  GLUCOSE 144* 114* 119* 128*  BUN 14 16 9 12   CREATININE 0.76 0.88 0.61 0.67  CALCIUM 9.5 8.7* 8.8* 8.4*   Liver Function Tests: Recent Labs  Lab 01/12/18 1255 01/13/18 0449 01/14/18 0406 01/15/18 0434  AST 19 12* 13* 28  ALT 20 16 15 31   ALKPHOS 69 61 56 50  BILITOT 1.3* 1.2 0.7 0.6  PROT 7.5 6.3* 6.5 6.0*  ALBUMIN 4.6 3.8 3.8 3.3*   Recent Labs  Lab 01/12/18 1255  LIPASE 28   No results for input(s): AMMONIA in the last 168 hours. CBC: Recent Labs  Lab 01/12/18 1255 01/13/18 0449 01/14/18 0406 01/15/18 0434  WBC 18.8* 11.7* 8.4 12.5*  NEUTROABS  --  7.6 5.1 9.5*  HGB 15.5* 12.9 12.9 12.0  HCT 46.0 40.1 40.1 37.9  MCV 93.9 95.2 95.7 97.9  PLT 160 146* 170 179   Cardiac Enzymes: No results for input(s): CKTOTAL, CKMB, CKMBINDEX, TROPONINI in the last 168 hours. BNP: BNP (last 3 results) No results for input(s): BNP in the last 8760 hours.  ProBNP (last 3 results) No results for input(s): PROBNP in the last 8760 hours.  CBG: Recent Labs  Lab 01/14/18 1207 01/14/18 1648 01/14/18 2113 01/15/18 0741 01/15/18 1120  GLUCAP 160* 129* 141* 143* 130*       Signed:  Lelon Frohlich  [facility] Pager: [phone] 01/15/2018, 11:35 AM

## 2018-01-22 DIAGNOSIS — K5900 Constipation, unspecified: Secondary | ICD-10-CM | POA: Diagnosis not present

## 2018-01-22 DIAGNOSIS — K810 Acute cholecystitis: Secondary | ICD-10-CM | POA: Diagnosis not present

## 2018-01-27 DIAGNOSIS — Z79899 Other long term (current) drug therapy: Secondary | ICD-10-CM | POA: Diagnosis not present

## 2018-01-27 DIAGNOSIS — R5383 Other fatigue: Secondary | ICD-10-CM | POA: Diagnosis not present

## 2018-01-27 DIAGNOSIS — M0609 Rheumatoid arthritis without rheumatoid factor, multiple sites: Secondary | ICD-10-CM | POA: Diagnosis not present

## 2018-01-29 ENCOUNTER — Ambulatory Visit (INDEPENDENT_AMBULATORY_CARE_PROVIDER_SITE_OTHER): Payer: Self-pay | Admitting: General Surgery

## 2018-01-29 ENCOUNTER — Encounter: Payer: Self-pay | Admitting: General Surgery

## 2018-01-29 VITALS — BP 132/80 | HR 81 | Temp 98.6°F | Resp 18 | Wt 230.0 lb

## 2018-01-29 DIAGNOSIS — K810 Acute cholecystitis: Secondary | ICD-10-CM

## 2018-01-29 NOTE — Patient Instructions (Signed)
Activity as tolerated. Listen to your body. Ok to go in Dentist.

## 2018-01-29 NOTE — Progress Notes (Signed)
[facility] Note   HPI:  62 y.o. Female presents to clinic for post-op follow-up evaluation after a lap chole. Patient reports she is doing well. Figuring out what she can eat and not eat.  Review of Systems:  No fevers or chills Regular Bms All other review of systems: otherwise negative   Pathology: Diagnosis Gallbladder - ACUTE AND CHRONIC CHOLECYSTITIS.  Vital Signs:  BP 132/80 (BP Location: Left Arm, Patient Position: Sitting, Cuff Size: Normal)   Pulse 81   Temp 98.6 F (37 C) (Temporal)   Resp 18   Wt 230 lb (104.3 kg)   BMI 33.00 kg/m    Physical Exam:  Physical Exam  HENT:  Head: Normocephalic.  Cardiovascular: Normal rate.  Pulmonary/Chest: Effort normal.  Abdominal: Soft. She exhibits no distension. There is no tenderness.  Port sites healing, dermabond peeling, no erythema or drainage  Skin: Skin is warm and dry.  Vitals reviewed.   Laboratory studies: None  Imaging:  None    Assessment:  62 y.o. yo Female s/p lap chole for cholecystitis. Doing well.  Plan:  Activity as tolerated. Listen to your body. Ok to go in Dentist.   All of the above recommendations were discussed with the patient, and all of patient's questions were answered to her expressed satisfaction.  Curlene Labrum, MD [facility] [address] [place], [place] [address] [phone] (office)

## 2018-02-02 DIAGNOSIS — M069 Rheumatoid arthritis, unspecified: Secondary | ICD-10-CM | POA: Diagnosis not present

## 2018-02-02 DIAGNOSIS — E039 Hypothyroidism, unspecified: Secondary | ICD-10-CM | POA: Diagnosis not present

## 2018-02-02 DIAGNOSIS — E119 Type 2 diabetes mellitus without complications: Secondary | ICD-10-CM | POA: Diagnosis not present

## 2018-02-02 DIAGNOSIS — Z79899 Other long term (current) drug therapy: Secondary | ICD-10-CM | POA: Diagnosis not present

## 2018-02-09 DIAGNOSIS — Z01812 Encounter for preprocedural laboratory examination: Secondary | ICD-10-CM | POA: Diagnosis not present

## 2018-02-09 DIAGNOSIS — R609 Edema, unspecified: Secondary | ICD-10-CM | POA: Diagnosis not present

## 2018-02-09 DIAGNOSIS — E119 Type 2 diabetes mellitus without complications: Secondary | ICD-10-CM | POA: Diagnosis not present

## 2018-02-10 DIAGNOSIS — M0609 Rheumatoid arthritis without rheumatoid factor, multiple sites: Secondary | ICD-10-CM | POA: Diagnosis not present

## 2018-02-10 DIAGNOSIS — M542 Cervicalgia: Secondary | ICD-10-CM | POA: Diagnosis not present

## 2018-02-18 DIAGNOSIS — M1711 Unilateral primary osteoarthritis, right knee: Secondary | ICD-10-CM | POA: Diagnosis not present

## 2018-02-26 DIAGNOSIS — M1711 Unilateral primary osteoarthritis, right knee: Secondary | ICD-10-CM | POA: Diagnosis not present

## 2018-03-02 DIAGNOSIS — M1711 Unilateral primary osteoarthritis, right knee: Secondary | ICD-10-CM | POA: Diagnosis not present

## 2018-03-02 DIAGNOSIS — Z471 Aftercare following joint replacement surgery: Secondary | ICD-10-CM | POA: Diagnosis not present

## 2018-03-02 DIAGNOSIS — Z96651 Presence of right artificial knee joint: Secondary | ICD-10-CM | POA: Diagnosis not present

## 2018-03-11 DIAGNOSIS — M1711 Unilateral primary osteoarthritis, right knee: Secondary | ICD-10-CM | POA: Diagnosis not present

## 2018-03-25 DIAGNOSIS — M1711 Unilateral primary osteoarthritis, right knee: Secondary | ICD-10-CM | POA: Diagnosis not present

## 2018-03-25 DIAGNOSIS — Z96653 Presence of artificial knee joint, bilateral: Secondary | ICD-10-CM | POA: Diagnosis not present

## 2018-03-26 DIAGNOSIS — M0609 Rheumatoid arthritis without rheumatoid factor, multiple sites: Secondary | ICD-10-CM | POA: Diagnosis not present

## 2018-04-01 DIAGNOSIS — Z96653 Presence of artificial knee joint, bilateral: Secondary | ICD-10-CM | POA: Diagnosis not present

## 2018-04-03 DIAGNOSIS — Z23 Encounter for immunization: Secondary | ICD-10-CM | POA: Diagnosis not present

## 2018-04-07 DIAGNOSIS — G5603 Carpal tunnel syndrome, bilateral upper limbs: Secondary | ICD-10-CM | POA: Diagnosis not present

## 2018-04-07 DIAGNOSIS — M0609 Rheumatoid arthritis without rheumatoid factor, multiple sites: Secondary | ICD-10-CM | POA: Diagnosis not present

## 2018-04-07 DIAGNOSIS — M150 Primary generalized (osteo)arthritis: Secondary | ICD-10-CM | POA: Diagnosis not present

## 2018-04-07 DIAGNOSIS — R5383 Other fatigue: Secondary | ICD-10-CM | POA: Diagnosis not present

## 2018-04-07 DIAGNOSIS — M542 Cervicalgia: Secondary | ICD-10-CM | POA: Diagnosis not present

## 2018-04-08 DIAGNOSIS — G5601 Carpal tunnel syndrome, right upper limb: Secondary | ICD-10-CM | POA: Diagnosis not present

## 2018-04-08 DIAGNOSIS — G5602 Carpal tunnel syndrome, left upper limb: Secondary | ICD-10-CM | POA: Diagnosis not present

## 2018-04-13 DIAGNOSIS — G5603 Carpal tunnel syndrome, bilateral upper limbs: Secondary | ICD-10-CM | POA: Diagnosis not present

## 2018-04-13 DIAGNOSIS — Z471 Aftercare following joint replacement surgery: Secondary | ICD-10-CM | POA: Diagnosis not present

## 2018-04-15 DIAGNOSIS — M79675 Pain in left toe(s): Secondary | ICD-10-CM | POA: Diagnosis not present

## 2018-04-15 DIAGNOSIS — L600 Ingrowing nail: Secondary | ICD-10-CM | POA: Diagnosis not present

## 2018-04-15 DIAGNOSIS — M79672 Pain in left foot: Secondary | ICD-10-CM | POA: Diagnosis not present

## 2018-04-15 DIAGNOSIS — L03032 Cellulitis of left toe: Secondary | ICD-10-CM | POA: Diagnosis not present

## 2018-04-24 DIAGNOSIS — M0609 Rheumatoid arthritis without rheumatoid factor, multiple sites: Secondary | ICD-10-CM | POA: Diagnosis not present

## 2018-04-27 DIAGNOSIS — Z09 Encounter for follow-up examination after completed treatment for conditions other than malignant neoplasm: Secondary | ICD-10-CM | POA: Diagnosis not present

## 2018-04-27 DIAGNOSIS — M25561 Pain in right knee: Secondary | ICD-10-CM | POA: Diagnosis not present

## 2018-04-27 DIAGNOSIS — M25562 Pain in left knee: Secondary | ICD-10-CM | POA: Diagnosis not present

## 2018-04-27 DIAGNOSIS — Z96653 Presence of artificial knee joint, bilateral: Secondary | ICD-10-CM | POA: Diagnosis not present

## 2018-05-06 DIAGNOSIS — M79675 Pain in left toe(s): Secondary | ICD-10-CM | POA: Diagnosis not present

## 2018-05-06 DIAGNOSIS — L03032 Cellulitis of left toe: Secondary | ICD-10-CM | POA: Diagnosis not present

## 2018-05-06 DIAGNOSIS — M79672 Pain in left foot: Secondary | ICD-10-CM | POA: Diagnosis not present

## 2018-05-06 DIAGNOSIS — L600 Ingrowing nail: Secondary | ICD-10-CM | POA: Diagnosis not present

## 2018-05-11 DIAGNOSIS — E785 Hyperlipidemia, unspecified: Secondary | ICD-10-CM | POA: Diagnosis not present

## 2018-05-11 DIAGNOSIS — E119 Type 2 diabetes mellitus without complications: Secondary | ICD-10-CM | POA: Diagnosis not present

## 2018-05-11 DIAGNOSIS — Z79899 Other long term (current) drug therapy: Secondary | ICD-10-CM | POA: Diagnosis not present

## 2018-05-11 DIAGNOSIS — E039 Hypothyroidism, unspecified: Secondary | ICD-10-CM | POA: Diagnosis not present

## 2018-05-18 DIAGNOSIS — E039 Hypothyroidism, unspecified: Secondary | ICD-10-CM | POA: Diagnosis not present

## 2018-05-18 DIAGNOSIS — G9009 Other idiopathic peripheral autonomic neuropathy: Secondary | ICD-10-CM | POA: Diagnosis not present

## 2018-05-18 DIAGNOSIS — E785 Hyperlipidemia, unspecified: Secondary | ICD-10-CM | POA: Diagnosis not present

## 2018-05-18 DIAGNOSIS — E1169 Type 2 diabetes mellitus with other specified complication: Secondary | ICD-10-CM | POA: Diagnosis not present

## 2018-05-18 DIAGNOSIS — E538 Deficiency of other specified B group vitamins: Secondary | ICD-10-CM | POA: Diagnosis not present

## 2018-05-25 DIAGNOSIS — M25561 Pain in right knee: Secondary | ICD-10-CM | POA: Diagnosis not present

## 2018-05-25 DIAGNOSIS — Z96651 Presence of right artificial knee joint: Secondary | ICD-10-CM | POA: Diagnosis not present

## 2018-06-01 DIAGNOSIS — G5603 Carpal tunnel syndrome, bilateral upper limbs: Secondary | ICD-10-CM | POA: Diagnosis not present

## 2018-06-01 DIAGNOSIS — Z96653 Presence of artificial knee joint, bilateral: Secondary | ICD-10-CM | POA: Diagnosis not present

## 2018-06-01 DIAGNOSIS — Z09 Encounter for follow-up examination after completed treatment for conditions other than malignant neoplasm: Secondary | ICD-10-CM | POA: Diagnosis not present

## 2018-06-05 DIAGNOSIS — D519 Vitamin B12 deficiency anemia, unspecified: Secondary | ICD-10-CM | POA: Diagnosis not present

## 2018-06-05 DIAGNOSIS — S81001A Unspecified open wound, right knee, initial encounter: Secondary | ICD-10-CM | POA: Diagnosis not present

## 2018-06-08 DIAGNOSIS — Z96653 Presence of artificial knee joint, bilateral: Secondary | ICD-10-CM | POA: Diagnosis not present

## 2018-06-10 ENCOUNTER — Ambulatory Visit: Payer: BLUE CROSS/BLUE SHIELD | Admitting: Neurology

## 2018-06-10 ENCOUNTER — Other Ambulatory Visit (INDEPENDENT_AMBULATORY_CARE_PROVIDER_SITE_OTHER): Payer: BLUE CROSS/BLUE SHIELD

## 2018-06-10 ENCOUNTER — Encounter: Payer: Self-pay | Admitting: Neurology

## 2018-06-10 VITALS — BP 118/80 | HR 88 | Ht 69.0 in | Wt 230.0 lb

## 2018-06-10 DIAGNOSIS — R292 Abnormal reflex: Secondary | ICD-10-CM

## 2018-06-10 DIAGNOSIS — G8220 Paraplegia, unspecified: Secondary | ICD-10-CM

## 2018-06-10 DIAGNOSIS — R519 Headache, unspecified: Secondary | ICD-10-CM

## 2018-06-10 DIAGNOSIS — G959 Disease of spinal cord, unspecified: Secondary | ICD-10-CM

## 2018-06-10 DIAGNOSIS — R51 Headache: Secondary | ICD-10-CM

## 2018-06-10 DIAGNOSIS — R202 Paresthesia of skin: Secondary | ICD-10-CM

## 2018-06-10 LAB — SEDIMENTATION RATE: Sed Rate: 25 mm/hr (ref 0–30)

## 2018-06-10 LAB — FOLATE: Folate: >23.9 ng/mL (ref >5.9)

## 2018-06-10 LAB — C-REACTIVE PROTEIN: CRP: 0.6 mg/dL (ref 0.5–20.0)

## 2018-06-10 LAB — VITAMIN B12: Vitamin B-12: 263 pg/mL (ref 211–911)

## 2018-06-10 NOTE — Progress Notes (Signed)
[facility] Clinic Note - Initial Visit   Date: 06/10/18  Monique Weiss MRN: [id number] DOB: [birth year]   Dear Dr. Rhona Raider:  Thank you for your kind referral of Monique Weiss for consultation of generalized paresthesias and weakness. Although her history is well known to you, please allow [place] to reiterate it for the purpose of our medical record. The patient was accompanied to the clinic by self.    History of Present Illness: Monique Weiss is a 62 y.o. right-handed Caucasian female with diabetes mellitus, hypothyroidism, hyperlipidemia, history of rheumatic fever referred for urgent evaluation of generalized paresthesias and weakness.    She started having falls in July 2019 after her cholecystectomy and continued to have falls about once per month since then.  She underwent bilateral total knee replacement with the right performed in September 2019.  Unfortunately, right knee replacement has been complicated by staph infection and she is being treated with clindamycin.  She cannot tell why she falls.  She does not have back pain.  Some of her falls were associated with lightheadedness.  Over the past several months, she has started to have numbness/tingling of the entire legs, buttocks, stomach, and hands and forearms.  She also has achy wrist pain. She has developed weakness of the hands and legs.  Her hand writing has been poor and she has difficulty with fine motor tasks such as grasping objects, opening her medication bottles, she reports always dropping her pills.  She is having greater difficulty with using utensils to feet herself.  She has problems with personal hygeine due to numbness.  She does not have incontinence.  She went to drive [place] [address] and noticed that she could not feel the pedal, so stopped driving since then.  She does not have muscle twitches.  She has history of three lumbar fusions and has morning stiffness related to this.  She  denies radicular pain.   She has been having increased headaches, which sometimes wake her up from sleeping.  No worsening of headache with cough, sneezing, or bearing down.  No problems with vision, talking, swallowing.     Past Medical History:  Diagnosis Date  . Anxiety   . Diabetes mellitus without complication ([place])   . Hyperlipidemia   . Thyroid disease    Hypo    Past Surgical History:  Procedure Laterality Date  . CHOLECYSTECTOMY N/A 01/14/2018   Procedure: LAPAROSCOPIC CHOLECYSTECTOMY;  Surgeon: Virl Cagey, MD;  Location: [facility];  Service: General;  Laterality: N/A;  . REPLACEMENT TOTAL KNEE    . SHOULDER SURGERY       Medications:  Outpatient Encounter Medications as of 06/10/2018  Medication Sig Note  . atorvastatin (LIPITOR) 40 MG tablet TK 1 T PO HS   . cyclobenzaprine (FLEXERIL) 10 MG tablet cyclobenzaprine 10 mg tablet 01/12/2018: Run out need refill  . docusate sodium (COLACE) 100 MG capsule Take 1 capsule (100 mg total) by mouth 2 (two) times daily as needed for mild constipation.   . furosemide (LASIX) 40 MG tablet Take 80 mg by mouth daily.   Marland Kitchen levothyroxine (SYNTHROID, LEVOTHROID) 75 MCG tablet TK 1 T  PO QD   . metFORMIN (GLUCOPHAGE) 1000 MG tablet TK 1 T PO BID   . promethazine (PHENERGAN) 12.5 MG tablet TAKE 1 OR 2 TABLETS PO Q 6 H PRN NV   . sertraline (ZOLOFT) 50 MG tablet TK 1/2 T PO QD FOR 6 DAYS THEN TK 1  T PO DAILY   . spironolactone (ALDACTONE) 25 MG tablet spironolactone 25 mg tablet   . [DISCONTINUED] oxyCODONE (OXY IR/ROXICODONE) 5 MG immediate release tablet Take 1 tablet (5 mg total) by mouth every 4 (four) hours as needed for breakthrough pain.   . [DISCONTINUED] tiZANidine (ZANAFLEX) 2 MG tablet TK 1 T PO BID PRF SPASM 01/12/2018: Need refill  . [DISCONTINUED] Tocilizumab (ACTEMRA IV) Inject into the vein.   . [DISCONTINUED] Tofacitinib Citrate (XELJANZ XR) 11 MG TB24 Xeljanz XR 11 mg tablet,extended release    No facility-administered  encounter medications on file as of 06/10/2018.      Allergies: No Known Allergies  Family History: Family History  Problem Relation Age of Onset  . Heart disease Mother   . Diabetes Mother   . Hypertension Mother   . Heart failure Mother   . Heart attack Father   . Prostate cancer Father   . Heart disease Sister   . Hypertension Sister   . Diabetes Sister   . Heart attack Brother   . Heart disease Brother   . Hypertension Brother   . Diabetes Brother     Social History: Social History   Tobacco Use  . Smoking status: Never Smoker  . Smokeless tobacco: Never Used  Substance Use Topics  . Alcohol use: Never    Frequency: Never  . Drug use: Never   Social History   Social History Narrative   She lives with her son and daughter-in-law.     Highest level of education:  12th grade   She worked as a Building control surveyor.  Not currently working.      Review of Systems:  CONSTITUTIONAL: No fevers, chills, night sweats, + 27lb weight loss.   EYES: No visual changes or eye pain ENT: No hearing changes.  No history of nose bleeds.   RESPIRATORY: No cough, wheezing and shortness of breath.   CARDIOVASCULAR: Negative for chest pain, and palpitations.   GI: Negative for abdominal discomfort, blood in stools or black stools.  No recent change in bowel habits.   GU:  No history of incontinence.   MUSCLOSKELETAL: No history of joint pain or swelling.  No myalgias.   SKIN: Negative for lesions, rash, and itching.   HEMATOLOGY/ONCOLOGY: Negative for prolonged bleeding, bruising easily, and swollen nodes.  No history of cancer.   ENDOCRINE: Negative for cold or heat intolerance, polydipsia or goiter.   PSYCH:  +depression or anxiety symptoms.   NEURO: As Above.   Vital Signs:  BP 118/80   Pulse 88   Ht '5\' 9"'  (1.753 m)   Wt 230 lb (104.3 kg)   SpO2 98%   BMI 33.97 kg/m    General Medical Exam:   General:  Well appearing, comfortable.   Eyes/ENT: see cranial nerve examination.     Neck: No masses appreciated.  Full range of motion without tenderness.  No carotid bruits. Respiratory:  Clear to auscultation, good air entry bilaterally.   Cardiac:  Regular rate and rhythm, no murmur.   Extremities:  No deformities, edema, or skin discoloration.  Skin:  Multiple ecchymosis over the arms.   Neurological Exam: MENTAL STATUS including orientation to time, place, person, recent and remote memory, attention span and concentration, language, and fund of knowledge is normal.  Speech is not dysarthric.  CRANIAL NERVES: II:  No visual field defects.  Unremarkable fundi.   III-IV-VI: Pupils equal round and reactive to light.  Normal conjugate, extra-ocular eye movements in all directions of  gaze.  No nystagmus.  No ptosis.   V:  Normal facial sensation.     VII:  Normal facial symmetry and movements.  No pathologic facial reflexes.  VIII:  Normal hearing and vestibular function.   IX-X:  Normal palatal movement.   XI:  Normal shoulder shrug and head rotation.   XII:  Normal tongue strength and range of motion, no deviation or fasciculation.  MOTOR:  There is mild atrophy of the intrinsic hand muscles.  No fasciculations or abnormal movements.  No pronator drift.  Tone is normal.    Right Upper Extremity:    Left Upper Extremity:    Deltoid  5/5   Deltoid  5/5   Biceps  5/5   Biceps  5/5   Triceps  5/5   Triceps  5/5   Wrist extensors  4/5   Wrist extensors  4/5   Wrist flexors  4/5   Wrist flexors  4/5   Finger extensors  4/5   Finger extensors  4/5   Finger flexors  4/5   Finger flexors  4/5   Dorsal interossei  4/5   Dorsal interossei  4/5   Abductor pollicis  4/5   Abductor pollicis  4/5   Tone (Ashworth scale)  0  Tone (Ashworth scale)  0   Right Lower Extremity:    Left Lower Extremity:    Hip flexors  4/5   Hip flexors  4/5   Hip extensors  5/5   Hip extensors  5/5   Knee flexors  5/5   Knee flexors  5/5   Knee extensors  5/5   Knee extensors  5/5    Dorsiflexors  5/5   Dorsiflexors  5/5   Plantarflexors  5/5   Plantarflexors  5/5   Toe extensors  5/5   Toe extensors  5/5   Toe flexors  5/5   Toe flexors  5/5   Tone (Ashworth scale)  0  Tone (Ashworth scale)  0   MSRs:  Right                                                                 Left brachioradialis 3+  brachioradialis 3+  biceps 3+  biceps 3+  triceps 3+  triceps 3+  patellar 3+  patellar 3+  ankle jerk 0  ankle jerk 0  Hoffman no  Hoffman no  plantar response down  plantar response down   SENSORY:  T8 sensory level with loss of pin prick, temperature, and vibration in the legs, reduced at the MCP as compared to the elbow.  Romberg's sign is positive  COORDINATION/GAIT:  Mild dysmetria with finger to nose testing on the right only.  Finger tapping and toe tapping is slowed bilaterally.  Gait is very unsteady, slow and wide-based.   IMPRESSION: Monique Weiss is a 62 year-old female referred for evaluation of progressive paresthesias and weakness of the hands and legs which started in early fall 2019.  Her exam shows globally reduced sensation to all modalities in the lower legs with T8 sensory level, hyperreflexia, and distal weakness of the hands and proximal legs.  Her symptoms and exam is concerning for spinal cord pathology, such as transverse myelitis vs demyelinating disease. I will order ASAP MRI  MRI brain, cervical spine, and thoracic spine wwo contrast.  Check ESR, CRP, ANA, vitamin B12, folate, copper.  She is very high risk of falls and I urged her to use a walker, not a cane.  Do not drive due to inability to safely manipulate pedals.    Further recommendations pending results   Thank you for allowing me to participate in patient's care.  If I can answer any additional questions, I would be pleased to do so.    Sincerely,    Javeion Cannedy K. Posey Pronto, DO

## 2018-06-10 NOTE — Patient Instructions (Addendum)
Check labs  We will check MRI of the brain, cervical spine, and thoracic spine  Please start to use your walker at all times  Do not recommend that you drive   Please start using a pillbox to organize your medications

## 2018-06-12 LAB — TIQ-NTM

## 2018-06-12 LAB — ANTI-NUCLEAR AB-TITER (ANA TITER): ANA Titer 1: 1:40 {titer} — ABNORMAL HIGH

## 2018-06-12 LAB — COPPER, SERUM

## 2018-06-12 LAB — ANA: Anti Nuclear Antibody(ANA): POSITIVE — AB

## 2018-06-21 ENCOUNTER — Other Ambulatory Visit: Payer: BLUE CROSS/BLUE SHIELD

## 2018-06-23 ENCOUNTER — Ambulatory Visit
Admission: RE | Admit: 2018-06-23 | Discharge: 2018-06-23 | Disposition: A | Discharge status: Home / Self Care | Payer: BLUE CROSS/BLUE SHIELD | Source: Ambulatory Visit | Attending: Neurology | Admitting: Neurology

## 2018-06-23 ENCOUNTER — Telehealth: Payer: Self-pay | Admitting: Neurology

## 2018-06-23 ENCOUNTER — Other Ambulatory Visit: Payer: BLUE CROSS/BLUE SHIELD

## 2018-06-23 ENCOUNTER — Encounter: Payer: Self-pay | Admitting: *Deleted

## 2018-06-23 DIAGNOSIS — M4802 Spinal stenosis, cervical region: Secondary | ICD-10-CM | POA: Diagnosis not present

## 2018-06-23 DIAGNOSIS — R202 Paresthesia of skin: Secondary | ICD-10-CM

## 2018-06-23 DIAGNOSIS — G8220 Paraplegia, unspecified: Secondary | ICD-10-CM

## 2018-06-23 DIAGNOSIS — M50223 Other cervical disc displacement at C6-C7 level: Secondary | ICD-10-CM | POA: Diagnosis not present

## 2018-06-23 DIAGNOSIS — R292 Abnormal reflex: Secondary | ICD-10-CM

## 2018-06-23 DIAGNOSIS — G959 Disease of spinal cord, unspecified: Secondary | ICD-10-CM

## 2018-06-23 MED ORDER — GADOBENATE DIMEGLUMINE 529 MG/ML IV SOLN
20.0000 mL | Freq: Once | INTRAVENOUS | Status: AC | PRN
  Administered 2018-06-23: 20 mL via INTRAVENOUS

## 2018-06-23 NOTE — Telephone Encounter (Signed)
Results of patient's MRI was discussed with patient which shows severe multilevel cervical spinal canal stenosis with cord compression worse at C4-5, and extending from C3-4 throught C6-7.  I have discussed this case with doctor on call at [facility] who will see the patient in the office on 1/2 for surgical evaluation.    We will cancel MRI brain and thoracic spine.   Donika K. Posey Pronto, DO

## 2018-06-25 DIAGNOSIS — M4802 Spinal stenosis, cervical region: Secondary | ICD-10-CM | POA: Diagnosis not present

## 2018-06-25 NOTE — Telephone Encounter (Signed)
I spoke with Manus Gunning and cancelled patient's upcoming appointments.

## 2018-06-30 ENCOUNTER — Other Ambulatory Visit (HOSPITAL_COMMUNITY): Payer: Self-pay | Admitting: Neurological Surgery

## 2018-06-30 ENCOUNTER — Other Ambulatory Visit: Payer: Self-pay | Admitting: Neurological Surgery

## 2018-06-30 DIAGNOSIS — M4802 Spinal stenosis, cervical region: Secondary | ICD-10-CM

## 2018-07-02 ENCOUNTER — Ambulatory Visit (HOSPITAL_COMMUNITY)
Admission: RE | Admit: 2018-07-02 | Discharge: 2018-07-02 | Disposition: A | Discharge status: Home / Self Care | Payer: BLUE CROSS/BLUE SHIELD | Source: Ambulatory Visit | Attending: Neurological Surgery | Admitting: Neurological Surgery

## 2018-07-02 DIAGNOSIS — M47812 Spondylosis without myelopathy or radiculopathy, cervical region: Secondary | ICD-10-CM | POA: Diagnosis not present

## 2018-07-02 DIAGNOSIS — M4802 Spinal stenosis, cervical region: Secondary | ICD-10-CM | POA: Insufficient documentation

## 2018-07-02 DIAGNOSIS — M50321 Other cervical disc degeneration at C4-C5 level: Secondary | ICD-10-CM | POA: Diagnosis not present

## 2018-07-03 DIAGNOSIS — M4802 Spinal stenosis, cervical region: Secondary | ICD-10-CM | POA: Diagnosis not present

## 2018-07-06 ENCOUNTER — Other Ambulatory Visit: Payer: BLUE CROSS/BLUE SHIELD

## 2018-07-06 DIAGNOSIS — Z96653 Presence of artificial knee joint, bilateral: Secondary | ICD-10-CM | POA: Diagnosis not present

## 2018-07-06 DIAGNOSIS — M1711 Unilateral primary osteoarthritis, right knee: Secondary | ICD-10-CM | POA: Diagnosis not present

## 2018-07-08 ENCOUNTER — Other Ambulatory Visit: Payer: Self-pay | Admitting: Neurological Surgery

## 2018-07-15 ENCOUNTER — Other Ambulatory Visit (HOSPITAL_COMMUNITY): Payer: Self-pay | Admitting: Internal Medicine

## 2018-07-15 ENCOUNTER — Ambulatory Visit (HOSPITAL_COMMUNITY)
Admission: RE | Admit: 2018-07-15 | Discharge: 2018-07-15 | Disposition: A | Discharge status: Home / Self Care | Payer: BLUE CROSS/BLUE SHIELD | Source: Ambulatory Visit | Attending: Internal Medicine | Admitting: Internal Medicine

## 2018-07-15 DIAGNOSIS — S299XXA Unspecified injury of thorax, initial encounter: Secondary | ICD-10-CM | POA: Diagnosis not present

## 2018-07-15 DIAGNOSIS — R0602 Shortness of breath: Secondary | ICD-10-CM

## 2018-07-15 DIAGNOSIS — R0781 Pleurodynia: Secondary | ICD-10-CM | POA: Insufficient documentation

## 2018-07-15 DIAGNOSIS — R0789 Other chest pain: Secondary | ICD-10-CM

## 2018-07-17 NOTE — Pre-Procedure Instructions (Addendum)
Monique Weiss  07/17/2018      [id number] - [place], [place] [address] [address] Phone: [phone] Fax: [phone]    Your procedure is scheduled on Mon. Feb., 3, 2020 from 7:30AM-11:00AM  Report to [facility] Admitting Entrance "A" at 5:30AM  Call this number if you have problems the morning of surgery:  [phone]   Remember:  Do not eat or drink after midnight on Feb. 2nd    Take these medicines the morning of surgery with A SIP OF WATER: Levothyroxine (SYNTHROID, LEVOTHROID) and Sertraline (ZOLOFT)   If needed: Cyclobenzaprine (FLEXERIL) and Promethazine (PHENERGAN)  Do not take MetFORMIN (GLUCOPHAGE) the morning of surgery.  As of today, stop taking all Other Aspirin Products, Vitamins, Fish oils, and Herbal medications. Also stop all NSAIDS i.e. Advil, Ibuprofen, Motrin, Aleve, Anaprox, Naproxen, BC, Goody Powders, and all Supplements.   How to Manage Your Diabetes Before and After Surgery  Why is it important to control my blood sugar before and after surgery? . Improving blood sugar levels before and after surgery helps healing and can limit problems. . A way of improving blood sugar control is eating a healthy diet by: o  Eating less sugar and carbohydrates o  Increasing activity/exercise o  Talking with your doctor about reaching your blood sugar goals . High blood sugars (greater than 180 mg/dL) can raise your risk of infections and slow your recovery, so you will need to focus on controlling your diabetes during the weeks before surgery. . Make sure that the doctor who takes care of your diabetes knows about your planned surgery including the date and location.  How do I manage my blood sugar before surgery? . Check your blood sugar at least 4 times a day, starting 2 days before surgery, to make sure that the level is not too high or low. o Check  your blood sugar the morning of your surgery when you wake up and every 2 hours until you get to the Short Stay unit. . If your blood sugar is less than 70 mg/dL, you will need to treat for low blood sugar: o Do not take insulin. o Treat a low blood sugar (less than 70 mg/dL) with  cup of clear juice (cranberry or apple), 4 glucose tablets, OR glucose gel. Recheck blood sugar in 15 minutes after treatment (to make sure it is greater than 70 mg/dL). If your blood sugar is not greater than 70 mg/dL on recheck, call [phone] o  for further instructions. If your CBG is greater than 220 mg/dL, inform the staff upon arrival to Short Stay.  . If you are admitted to the hospital after surgery: o Your blood sugar will be checked by the staff and you will probably be given insulin after surgery (instead of oral diabetes medicines) to make sure you have good blood sugar levels. o The goal for blood sugar control after surgery is 80-180 mg/dL.  Reviewed and Endorsed by [facility] Patient Education Committee, August 2015     Do not wear jewelry, make-up or nail polish.  Do not wear lotions, powders, or perfumes, or deodorant.  Do not shave 48 hours prior to surgery.    Do not bring valuables to the hospital.  [facility] is not responsible for any belongings or valuables.  Contacts, dentures or bridgework may not be worn into  surgery.  Leave your suitcase in the car.  After surgery it may be brought to your room.  For patients admitted to the hospital, discharge time will be determined by your treatment team.  Patients discharged the day of surgery will not be allowed to drive home.   Special instructions:   [facility]- Preparing For Surgery  Before surgery, you can play an important role. Because skin is not sterile, your skin needs to be as free of germs as possible. You can reduce the number of germs on your skin by washing with CHG (chlorahexidine gluconate) Soap before surgery.  CHG is  an antiseptic cleaner which kills germs and bonds with the skin to continue killing germs even after washing.    Oral Hygiene is also important to reduce your risk of infection.  Remember - BRUSH YOUR TEETH THE MORNING OF SURGERY WITH YOUR REGULAR TOOTHPASTE  Please do not use if you have an allergy to CHG or antibacterial soaps. If your skin becomes reddened/irritated stop using the CHG.  Do not shave (including legs and underarms) for at least 48 hours prior to first CHG shower. It is OK to shave your face.  Please follow these instructions carefully.   1. Shower the NIGHT BEFORE SURGERY and the MORNING OF SURGERY with CHG.   2. If you chose to wash your hair, wash your hair first as usual with your normal shampoo.  3. After you shampoo, rinse your hair and body thoroughly to remove the shampoo.  4. Use CHG as you would any other liquid soap. You can apply CHG directly to the skin and wash gently with a scrungie or a clean washcloth.   5. Apply the CHG Soap to your body ONLY FROM THE NECK DOWN.  Do not use on open wounds or open sores. Avoid contact with your eyes, ears, mouth and genitals (private parts). Wash Face and genitals (private parts)  with your normal soap.  6. Wash thoroughly, paying special attention to the area where your surgery will be performed.  7. Thoroughly rinse your body with warm water from the neck down.  8. DO NOT shower/wash with your normal soap after using and rinsing off the CHG Soap.  9. Pat yourself dry with a CLEAN TOWEL.  10. Wear CLEAN PAJAMAS to bed the night before surgery, wear comfortable clothes the morning of surgery  11. Place CLEAN SHEETS on your bed the night of your first shower and DO NOT SLEEP WITH PETS.  Day of Surgery:  Do not apply any deodorants/lotions.  Please wear clean clothes to the hospital/surgery center.   Remember to brush your teeth WITH YOUR REGULAR TOOTHPASTE.  Please read over the following fact sheets that you were  given. Pain Booklet, Coughing and Deep Breathing, MRSA Information and Surgical Site Infection Prevention

## 2018-07-20 ENCOUNTER — Encounter (HOSPITAL_COMMUNITY)
Admission: RE | Admit: 2018-07-20 | Discharge: 2018-07-20 | Disposition: A | Discharge status: Home / Self Care | Payer: BLUE CROSS/BLUE SHIELD | Source: Ambulatory Visit | Attending: Neurological Surgery | Admitting: Neurological Surgery

## 2018-07-20 ENCOUNTER — Encounter (HOSPITAL_COMMUNITY): Payer: Self-pay

## 2018-07-20 ENCOUNTER — Other Ambulatory Visit: Payer: Self-pay

## 2018-07-20 DIAGNOSIS — Z01812 Encounter for preprocedural laboratory examination: Secondary | ICD-10-CM | POA: Insufficient documentation

## 2018-07-20 HISTORY — DX: Nausea with vomiting, unspecified: R11.2

## 2018-07-20 HISTORY — DX: Essential (primary) hypertension: I10

## 2018-07-20 HISTORY — DX: Unspecified osteoarthritis, unspecified site: M19.90

## 2018-07-20 HISTORY — DX: Adverse effect of unspecified anesthetic, initial encounter: T41.45XA

## 2018-07-20 HISTORY — DX: Hypothyroidism, unspecified: E03.9

## 2018-07-20 HISTORY — DX: Other specified postprocedural states: Z98.890

## 2018-07-20 HISTORY — DX: Other complications of anesthesia, initial encounter: T88.59XA

## 2018-07-20 LAB — HEMOGLOBIN A1C
Hgb A1c MFr Bld: 6 % — ABNORMAL HIGH (ref 4.8–5.6)
Mean Plasma Glucose: 125.5 mg/dL

## 2018-07-20 LAB — BASIC METABOLIC PANEL
Anion gap: 12 (ref 5–15)
BUN: 20 mg/dL (ref 8–23)
CO2: 26 mmol/L (ref 22–32)
Calcium: 9.7 mg/dL (ref 8.9–10.3)
Chloride: 102 mmol/L (ref 98–111)
Creatinine, Ser: 0.7 mg/dL (ref 0.44–1.00)
GFR calc Af Amer: >60 mL/min (ref >60)
GFR calc non Af Amer: >60 mL/min (ref >60)
Glucose, Bld: 203 mg/dL — ABNORMAL HIGH (ref 70–99)
Potassium: 3.1 mmol/L — ABNORMAL LOW (ref 3.5–5.1)
Sodium: 140 mmol/L (ref 135–145)

## 2018-07-20 LAB — CBC
HCT: 42.9 % (ref 36.0–46.0)
Hemoglobin: 14.2 g/dL (ref 12.0–15.0)
MCH: 30.3 pg (ref 26.0–34.0)
MCHC: 33.1 g/dL (ref 30.0–36.0)
MCV: 91.5 fL (ref 80.0–100.0)
Platelets: 248 10*3/uL (ref 150–400)
RBC: 4.69 MIL/uL (ref 3.87–5.11)
RDW: 12.9 % (ref 11.5–15.5)
WBC: 8.6 10*3/uL (ref 4.0–10.5)
nRBC: 0 % (ref 0.0–0.2)

## 2018-07-20 LAB — TYPE AND SCREEN
ABO/RH(D): O POS
Antibody Screen: NEGATIVE

## 2018-07-20 LAB — GLUCOSE, CAPILLARY: Glucose-Capillary: 212 mg/dL — ABNORMAL HIGH (ref 70–99)

## 2018-07-20 LAB — ABO/RH: ABO/RH(D): O POS

## 2018-07-20 LAB — SURGICAL PCR SCREEN
MRSA, PCR: NEGATIVE
Staphylococcus aureus: NEGATIVE

## 2018-07-20 NOTE — Progress Notes (Signed)
PCP - Dr. Salena Saner  Cardiologist - Denies  Chest x-ray - 01/12/18 (E) 1-view  EKG - 01/12/18 (E)  Stress Test - Denies  ECHO - Denies  Cardiac Cath - Denies  AICD- na PM- na LOOP- na  Sleep Study - Denies CPAP - None  LABS- 07/20/2018: CBC, BMP, T/S, PCR  ASA- Denies  HA1C- 07/20/2018 Fasting Blood Sugar - 116-160, Today 212- Pt sts she forgot to take her medicine before leaving, and she ate a bowl of cereal. Checks Blood Sugar __1___ times a day  Anesthesia- Yes- EKG  Pt denies having chest pain, sob, or fever at this time. All instructions explained to the pt, with a verbal understanding of the material. Pt agrees to go over the instructions while at home for a better understanding. The opportunity to ask questions was provided.

## 2018-07-20 NOTE — Pre-Procedure Instructions (Addendum)
Monique Weiss  07/20/2018      [id number] - [place], [place] [address] [address] Phone: [phone] Fax: [phone]    Your procedure is scheduled on Mon. Feb., 3, 2020    Report to [facility] Admitting Entrance "A" at 5:30AM             (posted surgery time from 7:30AM-11:00AM)   Call this number if you have problems the morning of surgery:  [phone]   Remember:  Do not eat any foods or drink any liquids after midnight on Feb. 2nd    Take these medicines the morning of surgery with A SIP OF WATER: Levothyroxine (SYNTHROID, LEVOTHROID) and Sertraline (ZOLOFT)   If needed: Cyclobenzaprine (FLEXERIL) and Promethazine (PHENERGAN)  Do not take MetFORMIN (GLUCOPHAGE) the morning of surgery.  7 days before surgery (07/20/18), stop taking all Other Aspirin Products, Vitamins, Fish oils, and Herbal medications. Also stop all NSAIDS i.e. Advil, Ibuprofen, Motrin, Aleve, Anaprox, Naproxen, BC, Goody Powders, and all Supplements.   Do not wear jewelry, make-up or nail polish.  Do not wear lotions, powders, or perfumes, or deodorant.  Do not shave 48 hours prior to surgery.    Do not bring valuables to the hospital.  [facility] is not responsible for any belongings or valuables.  Contacts, dentures or bridgework may not be worn into surgery.  Leave your suitcase in the car.  After surgery it may be brought to your room.  For patients admitted to the hospital, discharge time will be determined by your treatment team.     Special instructions:   [facility]- Preparing For Surgery  Before surgery, you can play an important role. Because skin is not sterile, your skin needs to be as free of germs as possible. You can reduce the number of germs on your skin by washing with CHG (chlorahexidine gluconate) Soap before surgery.  CHG is an antiseptic cleaner which kills germs  and bonds with the skin to continue killing germs even after washing.    Oral Hygiene is also important to reduce your risk of infection.    Remember - BRUSH YOUR TEETH THE MORNING OF SURGERY WITH YOUR REGULAR TOOTHPASTE  Please do not use if you have an allergy to CHG or antibacterial soaps. If your skin becomes reddened/irritated stop using the CHG.  Do not shave (including legs and underarms) for at least 48 hours prior to first CHG shower. It is OK to shave your face.  Please follow these instructions carefully.   1. Shower the NIGHT BEFORE SURGERY and the MORNING OF SURGERY with CHG.   2. If you chose to wash your hair, wash your hair first as usual with your normal shampoo.  3. After you shampoo, rinse your hair and body thoroughly to remove the shampoo.  4. Use CHG as you would any other liquid soap. You can apply CHG directly to the skin and wash gently with a scrungie or a clean washcloth.   5. Apply the CHG Soap to your body ONLY FROM THE NECK DOWN.  Do not use on open wounds or open sores. Avoid contact with your eyes, ears, mouth and genitals (private parts). Wash Face and genitals (private parts)  with your normal soap.  6. Wash thoroughly, paying special attention to the area where your surgery will be performed.  7. Thoroughly rinse  your body with warm water from the neck down.  8. DO NOT shower/wash with your normal soap after using and rinsing off the CHG Soap.  9. Pat yourself dry with a CLEAN TOWEL.  10. Wear CLEAN PAJAMAS to bed the night before surgery, wear comfortable clothes the morning of surgery  11. Place CLEAN SHEETS on your bed the night of your first shower and DO NOT SLEEP WITH PETS.  Day of Surgery:  Do not apply any deodorants/lotions.  Please wear clean clothes to the hospital/surgery center.   Remember to brush your teeth WITH YOUR REGULAR TOOTHPASTE.  Please read over the following fact sheets that you were given. MRSA Information and  Surgical Site Infection Prevention    How to Manage Your Diabetes Before and After Surgery  Why is it important to control my blood sugar before and after surgery? . Improving blood sugar levels before and after surgery helps healing and can limit problems. . A way of improving blood sugar control is eating a healthy diet by: o  Eating less sugar and carbohydrates o  Increasing activity/exercise o  Talking with your doctor about reaching your blood sugar goals . High blood sugars (greater than 180 mg/dL) can raise your risk of infections and slow your recovery, so you will need to focus on controlling your diabetes during the weeks before surgery. . Make sure that the doctor who takes care of your diabetes knows about your planned surgery including the date and location.  How do I manage my blood sugar before surgery? . Check your blood sugar at least 4 times a day, starting 2 days before surgery, to make sure that the level is not too high or low.   o Check your blood sugar the morning of your surgery when you wake up and every 2 hours until you get to the Short Stay unit. . If your blood sugar is less than 70 mg/dL, you will need to treat for low blood sugar: o Do not take insulin. o Treat a low blood sugar (less than 70 mg/dL) with  cup of clear juice (cranberry or apple), 4 glucose tablets, OR glucose gel. o NO ORANGE JUICE   Recheck blood sugar in 15 minutes after treatment (to make sure it is greater than 70 mg/dL). If your blood sugar is not greater than 70 mg/dL on recheck, call [phone] o  for further instructions. If your CBG is greater than 220 mg/dL, inform the staff upon arrival to Short Stay.   . If you are admitted to the hospital after surgery: o Your blood sugar will be checked by the staff and you will probably be given insulin after surgery (instead of oral diabetes medicines) to make sure you have good blood sugar levels. o The goal for blood sugar control  after surgery is 80-180 mg/dL.    DO NOT TAKE YOUR METFORMIN THE MORNING OF SURGERY!!!

## 2018-07-26 NOTE — Anesthesia Preprocedure Evaluation (Addendum)
Anesthesia Evaluation  Patient identified by MRN, date of birth, ID band Patient awake    Reviewed: Allergy & Precautions, H&P , NPO status , Patient's Chart, lab work & pertinent test results  History of Anesthesia Complications (+) PONV  Airway Mallampati: III  TM Distance: >3 FB Neck ROM: Full    Dental no notable dental hx. (+) Teeth Intact, Dental Advisory Given   Pulmonary neg pulmonary ROS,    Pulmonary exam normal breath sounds clear to auscultation       Cardiovascular Exercise Tolerance: Good hypertension, Pt. on medications  Rhythm:Regular Rate:Normal     Neuro/Psych Anxiety negative neurological ROS     GI/Hepatic negative GI ROS, Neg liver ROS,   Endo/Other  diabetes, Type 2, Oral Hypoglycemic AgentsHypothyroidism   Renal/GU negative Renal ROS  negative genitourinary   Musculoskeletal  (+) Arthritis ,   Abdominal   Peds  Hematology negative hematology ROS (+)   Anesthesia Other Findings   Reproductive/Obstetrics negative OB ROS                            Anesthesia Physical Anesthesia Plan  ASA: II  Anesthesia Plan: General   Post-op Pain Management:    Induction: Intravenous  PONV Risk Score and Plan: 4 or greater and Ondansetron, Midazolam, Dexamethasone and Scopolamine patch - Pre-op  Airway Management Planned:   Additional Equipment:   Intra-op Plan:   Post-operative Plan:   Informed Consent: I have reviewed the patients History and Physical, chart, labs and discussed the procedure including the risks, benefits and alternatives for the proposed anesthesia with the patient or authorized representative who has indicated his/her understanding and acceptance.       Plan Discussed with: CRNA and Surgeon  Anesthesia Plan Comments:        Anesthesia Quick Evaluation

## 2018-07-27 ENCOUNTER — Other Ambulatory Visit: Payer: Self-pay

## 2018-07-27 ENCOUNTER — Encounter (HOSPITAL_COMMUNITY)
Admission: RE | Disposition: A | Discharge status: Home / Self Care | Payer: Self-pay | Source: Home / Self Care | Attending: Neurological Surgery

## 2018-07-27 ENCOUNTER — Ambulatory Visit: Payer: BLUE CROSS/BLUE SHIELD | Admitting: Neurology

## 2018-07-27 ENCOUNTER — Inpatient Hospital Stay (HOSPITAL_COMMUNITY)
Admission: RE | Admit: 2018-07-27 | Discharge: 2018-07-28 | DRG: 454 | Disposition: A | Discharge status: Home / Self Care | Payer: BLUE CROSS/BLUE SHIELD | Attending: Neurological Surgery | Admitting: Neurological Surgery

## 2018-07-27 ENCOUNTER — Inpatient Hospital Stay (HOSPITAL_COMMUNITY): Payer: BLUE CROSS/BLUE SHIELD

## 2018-07-27 ENCOUNTER — Encounter

## 2018-07-27 ENCOUNTER — Inpatient Hospital Stay (HOSPITAL_COMMUNITY): Payer: BLUE CROSS/BLUE SHIELD | Admitting: Physician Assistant

## 2018-07-27 ENCOUNTER — Inpatient Hospital Stay (HOSPITAL_COMMUNITY): Payer: BLUE CROSS/BLUE SHIELD | Admitting: Certified Registered"

## 2018-07-27 ENCOUNTER — Encounter (HOSPITAL_COMMUNITY): Payer: Self-pay | Admitting: Certified Registered"

## 2018-07-27 DIAGNOSIS — E785 Hyperlipidemia, unspecified: Secondary | ICD-10-CM | POA: Diagnosis present

## 2018-07-27 DIAGNOSIS — E119 Type 2 diabetes mellitus without complications: Secondary | ICD-10-CM | POA: Diagnosis present

## 2018-07-27 DIAGNOSIS — Z79890 Hormone replacement therapy (postmenopausal): Secondary | ICD-10-CM | POA: Diagnosis not present

## 2018-07-27 DIAGNOSIS — Z8042 Family history of malignant neoplasm of prostate: Secondary | ICD-10-CM

## 2018-07-27 DIAGNOSIS — Z8249 Family history of ischemic heart disease and other diseases of the circulatory system: Secondary | ICD-10-CM

## 2018-07-27 DIAGNOSIS — F419 Anxiety disorder, unspecified: Secondary | ICD-10-CM | POA: Diagnosis not present

## 2018-07-27 DIAGNOSIS — Z833 Family history of diabetes mellitus: Secondary | ICD-10-CM | POA: Diagnosis not present

## 2018-07-27 DIAGNOSIS — E039 Hypothyroidism, unspecified: Secondary | ICD-10-CM | POA: Diagnosis not present

## 2018-07-27 DIAGNOSIS — Z419 Encounter for procedure for purposes other than remedying health state, unspecified: Secondary | ICD-10-CM

## 2018-07-27 DIAGNOSIS — I10 Essential (primary) hypertension: Secondary | ICD-10-CM | POA: Diagnosis not present

## 2018-07-27 DIAGNOSIS — G959 Disease of spinal cord, unspecified: Secondary | ICD-10-CM | POA: Diagnosis not present

## 2018-07-27 DIAGNOSIS — M4802 Spinal stenosis, cervical region: Principal | ICD-10-CM | POA: Diagnosis present

## 2018-07-27 DIAGNOSIS — M1990 Unspecified osteoarthritis, unspecified site: Secondary | ICD-10-CM | POA: Diagnosis not present

## 2018-07-27 DIAGNOSIS — M4322 Fusion of spine, cervical region: Secondary | ICD-10-CM | POA: Diagnosis not present

## 2018-07-27 DIAGNOSIS — R296 Repeated falls: Secondary | ICD-10-CM | POA: Diagnosis present

## 2018-07-27 DIAGNOSIS — Z79899 Other long term (current) drug therapy: Secondary | ICD-10-CM

## 2018-07-27 DIAGNOSIS — R531 Weakness: Secondary | ICD-10-CM | POA: Diagnosis not present

## 2018-07-27 HISTORY — PX: ANTERIOR CERVICAL CORPECTOMY: SHX1159

## 2018-07-27 LAB — GLUCOSE, CAPILLARY
Glucose-Capillary: 117 mg/dL — ABNORMAL HIGH (ref 70–99)
Glucose-Capillary: 161 mg/dL — ABNORMAL HIGH (ref 70–99)
Glucose-Capillary: 244 mg/dL — ABNORMAL HIGH (ref 70–99)
Glucose-Capillary: 172 mg/dL — ABNORMAL HIGH (ref 70–99)

## 2018-07-27 SURGERY — ANTERIOR CERVICAL CORPECTOMY
Anesthesia: General

## 2018-07-27 MED ORDER — ONDANSETRON HCL 4 MG/2ML IJ SOLN
4.0000 mg | Freq: Four times a day (QID) | INTRAMUSCULAR | Status: DC | PRN
  Administered 2018-07-27: 4 mg via INTRAVENOUS
  Filled 2018-07-27: qty 2

## 2018-07-27 MED ORDER — SUCCINYLCHOLINE CHLORIDE 200 MG/10ML IV SOSY
PREFILLED_SYRINGE | INTRAVENOUS | Status: AC
  Filled 2018-07-27: qty 10

## 2018-07-27 MED ORDER — OXYCODONE HCL 5 MG PO TABS
5.0000 mg | ORAL_TABLET | ORAL | Status: DC | PRN
  Administered 2018-07-27: 5 mg via ORAL

## 2018-07-27 MED ORDER — SCOPOLAMINE 1 MG/3DAYS TD PT72
MEDICATED_PATCH | TRANSDERMAL | Status: DC | PRN
  Administered 2018-07-27: 1 via TRANSDERMAL

## 2018-07-27 MED ORDER — DEXAMETHASONE SODIUM PHOSPHATE 10 MG/ML IJ SOLN
INTRAMUSCULAR | Status: AC
  Filled 2018-07-27: qty 1

## 2018-07-27 MED ORDER — THROMBIN 5000 UNITS EX SOLR
OROMUCOSAL | Status: DC | PRN
  Administered 2018-07-27: 07:00:00 via TOPICAL

## 2018-07-27 MED ORDER — CHLORHEXIDINE GLUCONATE CLOTH 2 % EX PADS: 6.0000 | MEDICATED_PAD | Freq: Once | CUTANEOUS | Status: DC

## 2018-07-27 MED ORDER — HYDROMORPHONE HCL 1 MG/ML IJ SOLN
0.2500 mg | INTRAMUSCULAR | Status: DC | PRN
  Administered 2018-07-27 (×2): 0.5 mg via INTRAVENOUS

## 2018-07-27 MED ORDER — LIDOCAINE-EPINEPHRINE 1 %-1:100000 IJ SOLN
INTRAMUSCULAR | Status: AC
  Filled 2018-07-27: qty 1

## 2018-07-27 MED ORDER — OXYCODONE HCL 5 MG PO TABS
10.0000 mg | ORAL_TABLET | ORAL | Status: DC | PRN
  Administered 2018-07-27 – 2018-07-28 (×2): 10 mg via ORAL
  Filled 2018-07-27 (×2): qty 2

## 2018-07-27 MED ORDER — SPIRONOLACTONE 25 MG PO TABS
25.0000 mg | ORAL_TABLET | Freq: Every day | ORAL | Status: DC
  Administered 2018-07-27 – 2018-07-28 (×2): 25 mg via ORAL
  Filled 2018-07-27 (×2): qty 1

## 2018-07-27 MED ORDER — PHENYLEPHRINE 40 MCG/ML (10ML) SYRINGE FOR IV PUSH (FOR BLOOD PRESSURE SUPPORT)
PREFILLED_SYRINGE | INTRAVENOUS | Status: AC
  Filled 2018-07-27: qty 10

## 2018-07-27 MED ORDER — ACETAMINOPHEN 650 MG RE SUPP: 650.0000 mg | RECTAL | Status: DC | PRN

## 2018-07-27 MED ORDER — PROPOFOL 10 MG/ML IV BOLUS
INTRAVENOUS | Status: AC
  Filled 2018-07-27: qty 20

## 2018-07-27 MED ORDER — 0.9 % SODIUM CHLORIDE (POUR BTL) OPTIME
TOPICAL | Status: DC | PRN
  Administered 2018-07-27: 1000 mL

## 2018-07-27 MED ORDER — ACETAMINOPHEN 325 MG PO TABS
650.0000 mg | ORAL_TABLET | ORAL | Status: DC | PRN
  Administered 2018-07-27 – 2018-07-28 (×2): 650 mg via ORAL
  Filled 2018-07-27 (×2): qty 2

## 2018-07-27 MED ORDER — PHENOL 1.4 % MT LIQD: 1.0000 | OROMUCOSAL | Status: DC | PRN

## 2018-07-27 MED ORDER — ONDANSETRON HCL 4 MG PO TABS
4.0000 mg | ORAL_TABLET | Freq: Four times a day (QID) | ORAL | Status: DC | PRN
  Administered 2018-07-28: 4 mg via ORAL
  Filled 2018-07-27: qty 1

## 2018-07-27 MED ORDER — PHENYLEPHRINE 40 MCG/ML (10ML) SYRINGE FOR IV PUSH (FOR BLOOD PRESSURE SUPPORT)
PREFILLED_SYRINGE | INTRAVENOUS | Status: DC | PRN
  Administered 2018-07-27: 80 ug via INTRAVENOUS
  Administered 2018-07-27: 40 ug via INTRAVENOUS

## 2018-07-27 MED ORDER — LACTATED RINGERS IV SOLN
INTRAVENOUS | Status: DC | PRN
  Administered 2018-07-27: 08:00:00 via INTRAVENOUS

## 2018-07-27 MED ORDER — SODIUM CHLORIDE 0.9% FLUSH: 3.0000 mL | INTRAVENOUS | Status: DC | PRN

## 2018-07-27 MED ORDER — LEVOTHYROXINE SODIUM 75 MCG PO TABS
75.0000 ug | ORAL_TABLET | Freq: Every day | ORAL | Status: DC
  Administered 2018-07-28: 75 ug via ORAL
  Filled 2018-07-27: qty 1

## 2018-07-27 MED ORDER — PROMETHAZINE HCL 25 MG PO TABS: 12.5000 mg | ORAL_TABLET | Freq: Four times a day (QID) | ORAL | Status: DC | PRN

## 2018-07-27 MED ORDER — SUCCINYLCHOLINE CHLORIDE 200 MG/10ML IV SOSY
PREFILLED_SYRINGE | INTRAVENOUS | Status: DC | PRN
  Administered 2018-07-27: 100 mg via INTRAVENOUS

## 2018-07-27 MED ORDER — MENTHOL 3 MG MT LOZG: 1.0000 | LOZENGE | OROMUCOSAL | Status: DC | PRN

## 2018-07-27 MED ORDER — LIDOCAINE 2% (20 MG/ML) 5 ML SYRINGE
INTRAMUSCULAR | Status: AC
  Filled 2018-07-27: qty 5

## 2018-07-27 MED ORDER — OXYCODONE HCL 5 MG PO TABS
ORAL_TABLET | ORAL | Status: AC
  Filled 2018-07-27: qty 1

## 2018-07-27 MED ORDER — PROPOFOL 1000 MG/100ML IV EMUL
INTRAVENOUS | Status: AC
  Filled 2018-07-27: qty 200

## 2018-07-27 MED ORDER — SODIUM CHLORIDE 0.9 % IV SOLN: 250.0000 mL | INTRAVENOUS | Status: DC

## 2018-07-27 MED ORDER — THROMBIN 5000 UNITS EX SOLR
CUTANEOUS | Status: AC
  Filled 2018-07-27: qty 5000

## 2018-07-27 MED ORDER — ATORVASTATIN CALCIUM 40 MG PO TABS
40.0000 mg | ORAL_TABLET | Freq: Every day | ORAL | Status: DC
  Administered 2018-07-27: 40 mg via ORAL
  Filled 2018-07-27: qty 1

## 2018-07-27 MED ORDER — HYDROMORPHONE HCL 1 MG/ML IJ SOLN
0.5000 mg | INTRAMUSCULAR | Status: DC | PRN
  Administered 2018-07-27: 0.5 mg via INTRAVENOUS
  Filled 2018-07-27: qty 0.5

## 2018-07-27 MED ORDER — MIDAZOLAM HCL 5 MG/5ML IJ SOLN
INTRAMUSCULAR | Status: DC | PRN
  Administered 2018-07-27: 2 mg via INTRAVENOUS

## 2018-07-27 MED ORDER — PROPOFOL 1000 MG/100ML IV EMUL
INTRAVENOUS | Status: AC
  Filled 2018-07-27: qty 100

## 2018-07-27 MED ORDER — METFORMIN HCL 500 MG PO TABS
1000.0000 mg | ORAL_TABLET | Freq: Two times a day (BID) | ORAL | Status: DC
  Administered 2018-07-27 – 2018-07-28 (×2): 1000 mg via ORAL
  Filled 2018-07-27 (×2): qty 2

## 2018-07-27 MED ORDER — LINACLOTIDE 145 MCG PO CAPS
145.0000 ug | ORAL_CAPSULE | Freq: Every day | ORAL | Status: DC | PRN
  Filled 2018-07-27: qty 1

## 2018-07-27 MED ORDER — LIDOCAINE 2% (20 MG/ML) 5 ML SYRINGE
INTRAMUSCULAR | Status: DC | PRN
  Administered 2018-07-27: 60 mg via INTRAVENOUS

## 2018-07-27 MED ORDER — ACETAMINOPHEN 500 MG PO TABS
1000.0000 mg | ORAL_TABLET | Freq: Once | ORAL | Status: AC
  Administered 2018-07-27: 1000 mg via ORAL
  Filled 2018-07-27: qty 2

## 2018-07-27 MED ORDER — INSULIN ASPART 100 UNIT/ML ~~LOC~~ SOLN: 0.0000 [IU] | Freq: Every day | SUBCUTANEOUS | Status: DC

## 2018-07-27 MED ORDER — SODIUM CHLORIDE 0.9 % IV SOLN
INTRAVENOUS | Status: DC | PRN
  Administered 2018-07-27: 07:00:00

## 2018-07-27 MED ORDER — ONDANSETRON HCL 4 MG/2ML IJ SOLN
INTRAMUSCULAR | Status: DC | PRN
  Administered 2018-07-27: 4 mg via INTRAVENOUS

## 2018-07-27 MED ORDER — MIDAZOLAM HCL 2 MG/2ML IJ SOLN
INTRAMUSCULAR | Status: AC
  Filled 2018-07-27: qty 2

## 2018-07-27 MED ORDER — PROPOFOL 10 MG/ML IV BOLUS
INTRAVENOUS | Status: DC | PRN
  Administered 2018-07-27: 110 mg via INTRAVENOUS
  Administered 2018-07-27 (×2): 20 mg via INTRAVENOUS

## 2018-07-27 MED ORDER — FUROSEMIDE 40 MG PO TABS
80.0000 mg | ORAL_TABLET | Freq: Every day | ORAL | Status: DC
  Administered 2018-07-28: 80 mg via ORAL
  Filled 2018-07-27: qty 2

## 2018-07-27 MED ORDER — CYCLOBENZAPRINE HCL 10 MG PO TABS
10.0000 mg | ORAL_TABLET | Freq: Three times a day (TID) | ORAL | Status: DC | PRN
  Administered 2018-07-27: 10 mg via ORAL
  Filled 2018-07-27: qty 1

## 2018-07-27 MED ORDER — SERTRALINE HCL 50 MG PO TABS
50.0000 mg | ORAL_TABLET | Freq: Every day | ORAL | Status: DC
  Administered 2018-07-28: 50 mg via ORAL
  Filled 2018-07-27: qty 1

## 2018-07-27 MED ORDER — ONDANSETRON HCL 4 MG/2ML IJ SOLN
INTRAMUSCULAR | Status: AC
  Filled 2018-07-27: qty 2

## 2018-07-27 MED ORDER — HYDROMORPHONE HCL 1 MG/ML IJ SOLN
INTRAMUSCULAR | Status: AC
  Filled 2018-07-27: qty 1

## 2018-07-27 MED ORDER — LIDOCAINE-EPINEPHRINE 1 %-1:100000 IJ SOLN
INTRAMUSCULAR | Status: DC | PRN
  Administered 2018-07-27: 8 mL

## 2018-07-27 MED ORDER — DIPHENHYDRAMINE HCL 50 MG/ML IJ SOLN
INTRAMUSCULAR | Status: DC | PRN
  Administered 2018-07-27: 6.25 mg via INTRAVENOUS

## 2018-07-27 MED ORDER — CEFAZOLIN SODIUM-DEXTROSE 2-4 GM/100ML-% IV SOLN
2.0000 g | INTRAVENOUS | Status: AC
  Administered 2018-07-27: 2 g via INTRAVENOUS
  Filled 2018-07-27: qty 100

## 2018-07-27 MED ORDER — LACTATED RINGERS IV SOLN
INTRAVENOUS | Status: DC | PRN
  Administered 2018-07-27 (×3): via INTRAVENOUS

## 2018-07-27 MED ORDER — FENTANYL CITRATE (PF) 250 MCG/5ML IJ SOLN
INTRAMUSCULAR | Status: AC
  Filled 2018-07-27: qty 5

## 2018-07-27 MED ORDER — CEFAZOLIN SODIUM-DEXTROSE 2-4 GM/100ML-% IV SOLN
2.0000 g | Freq: Three times a day (TID) | INTRAVENOUS | Status: AC
  Administered 2018-07-27 (×2): 2 g via INTRAVENOUS
  Filled 2018-07-27 (×2): qty 100

## 2018-07-27 MED ORDER — SODIUM CHLORIDE 0.9 % IV SOLN
INTRAVENOUS | Status: DC | PRN
  Administered 2018-07-27: 20 ug/min via INTRAVENOUS

## 2018-07-27 MED ORDER — DEXAMETHASONE SODIUM PHOSPHATE 10 MG/ML IJ SOLN
INTRAMUSCULAR | Status: DC | PRN
  Administered 2018-07-27: 10 mg via INTRAVENOUS

## 2018-07-27 MED ORDER — DIPHENHYDRAMINE HCL 50 MG/ML IJ SOLN
INTRAMUSCULAR | Status: AC
  Filled 2018-07-27: qty 1

## 2018-07-27 MED ORDER — PROPOFOL 500 MG/50ML IV EMUL
INTRAVENOUS | Status: DC | PRN
  Administered 2018-07-27: 75 ug/kg/min via INTRAVENOUS

## 2018-07-27 MED ORDER — FENTANYL CITRATE (PF) 100 MCG/2ML IJ SOLN
INTRAMUSCULAR | Status: DC | PRN
  Administered 2018-07-27 (×4): 25 ug via INTRAVENOUS
  Administered 2018-07-27: 50 ug via INTRAVENOUS
  Administered 2018-07-27: 100 ug via INTRAVENOUS

## 2018-07-27 MED ORDER — INSULIN ASPART 100 UNIT/ML ~~LOC~~ SOLN: 0.0000 [IU] | Freq: Three times a day (TID) | SUBCUTANEOUS | Status: DC

## 2018-07-27 MED ORDER — SUGAMMADEX SODIUM 500 MG/5ML IV SOLN
INTRAVENOUS | Status: AC
  Filled 2018-07-27: qty 5

## 2018-07-27 MED ORDER — ROCURONIUM BROMIDE 50 MG/5ML IV SOSY
PREFILLED_SYRINGE | INTRAVENOUS | Status: AC
  Filled 2018-07-27: qty 5

## 2018-07-27 MED ORDER — SODIUM CHLORIDE 0.9% FLUSH: 3.0000 mL | Freq: Two times a day (BID) | INTRAVENOUS | Status: DC

## 2018-07-27 MED ORDER — SCOPOLAMINE 1 MG/3DAYS TD PT72
MEDICATED_PATCH | TRANSDERMAL | Status: AC
  Filled 2018-07-27: qty 1

## 2018-07-27 SURGICAL SUPPLY — 63 items
ADH SKN CLS APL DERMABOND .7 (GAUZE/BANDAGES/DRESSINGS) ×1
BAG DECANTER FOR FLEXI CONT (MISCELLANEOUS) ×2 IMPLANT
BLADE CLIPPER SURG (BLADE) IMPLANT
BLADE SURG 11 STRL SS (BLADE) ×2 IMPLANT
BUR MATCHSTICK NEURO 3.0 LAGG (BURR) ×2 IMPLANT
CAGE END CAP 9X14X11 (Cage) ×1 IMPLANT
CANISTER SUCT 3000ML PPV (MISCELLANEOUS) ×2 IMPLANT
CONT SPEC 4OZ CLIKSEAL STRL BL (MISCELLANEOUS) ×1 IMPLANT
COVER WAND RF STERILE (DRAPES) ×2 IMPLANT
DECANTER SPIKE VIAL GLASS SM (MISCELLANEOUS) ×2 IMPLANT
DERMABOND ADVANCED (GAUZE/BANDAGES/DRESSINGS) ×1
DERMABOND ADVANCED .7 DNX12 (GAUZE/BANDAGES/DRESSINGS) ×1 IMPLANT
DRAPE C-ARM 42X72 X-RAY (DRAPES) ×4 IMPLANT
DRAPE HALF SHEET 40X57 (DRAPES) IMPLANT
DRAPE LAPAROTOMY 100X72 PEDS (DRAPES) ×2 IMPLANT
DRAPE MICROSCOPE LEICA (MISCELLANEOUS) ×2 IMPLANT
DURAPREP 6ML APPLICATOR 50/CS (WOUND CARE) ×2 IMPLANT
ELECT COATED BLADE 2.86 ST (ELECTRODE) ×2 IMPLANT
ELECT REM PT RETURN 9FT ADLT (ELECTROSURGICAL) ×2
ELECTRODE REM PT RTRN 9FT ADLT (ELECTROSURGICAL) ×1 IMPLANT
FEE INTRAOP MONITOR IMPULS NCS (MISCELLANEOUS) IMPLANT
GAUZE 4X4 16PLY RFD (DISPOSABLE) IMPLANT
GLOVE BIO SURGEON STRL SZ7.5 (GLOVE) ×2 IMPLANT
GLOVE BIO SURGEON STRL SZ8 (GLOVE) ×1 IMPLANT
GLOVE BIO SURGEON STRL SZ8.5 (GLOVE) ×1 IMPLANT
GLOVE BIOGEL PI IND STRL 7.5 (GLOVE) ×2 IMPLANT
GLOVE BIOGEL PI INDICATOR 7.5 (GLOVE) ×5
GLOVE EXAM NITRILE LRG STRL (GLOVE) IMPLANT
GLOVE EXAM NITRILE XL STR (GLOVE) IMPLANT
GLOVE EXAM NITRILE XS STR PU (GLOVE) IMPLANT
GLOVE SURG SS PI 7.0 STRL IVOR (GLOVE) ×1 IMPLANT
GOWN STRL REUS W/ TWL LRG LVL3 (GOWN DISPOSABLE) ×2 IMPLANT
GOWN STRL REUS W/ TWL XL LVL3 (GOWN DISPOSABLE) IMPLANT
GOWN STRL REUS W/TWL 2XL LVL3 (GOWN DISPOSABLE) IMPLANT
GOWN STRL REUS W/TWL LRG LVL3 (GOWN DISPOSABLE) ×6
GOWN STRL REUS W/TWL XL LVL3 (GOWN DISPOSABLE) ×2
HEMOSTAT POWDER KIT SURGIFOAM (HEMOSTASIS) ×2 IMPLANT
INTRAOP MONITOR FEE IMPULS NCS (MISCELLANEOUS) ×1
INTRAOP MONITOR FEE IMPULSE (MISCELLANEOUS) ×1
KIT BASIN OR (CUSTOM PROCEDURE TRAY) ×2 IMPLANT
KIT TURNOVER KIT B (KITS) ×2 IMPLANT
NDL SPNL 18GX3.5 QUINCKE PK (NEEDLE) ×1 IMPLANT
NEEDLE HYPO 22GX1.5 SAFETY (NEEDLE) ×2 IMPLANT
NEEDLE SPNL 18GX3.5 QUINCKE PK (NEEDLE) ×2 IMPLANT
NS IRRIG 1000ML POUR BTL (IV SOLUTION) ×2 IMPLANT
PACK LAMINECTOMY NEURO (CUSTOM PROCEDURE TRAY) ×2 IMPLANT
PAD ARMBOARD 7.5X6 YLW CONV (MISCELLANEOUS) ×6 IMPLANT
PEEK ANATOMIC STRUT 5X14X11MM (Peek) ×1 IMPLANT
PEEK CAGE 8X14X11 (Peek) ×1 IMPLANT
PIN DISTRACTION 14MM (PIN) ×2 IMPLANT
PLATE ELITE SPINE 60MM (Plate) ×1 IMPLANT
RUBBERBAND STERILE (MISCELLANEOUS) ×4 IMPLANT
SCREW SELF TAP VAR 4.0X13 (Screw) ×6 IMPLANT
SPACER BONE CORNERSTONE 7X14 (Orthopedic Implant) ×1 IMPLANT
SPONGE INTESTINAL PEANUT (DISPOSABLE) ×2 IMPLANT
SPONGE SURGIFOAM ABS GEL SZ50 (HEMOSTASIS) IMPLANT
STAPLER VISISTAT 35W (STAPLE) IMPLANT
SUT MNCRL AB 3-0 PS2 18 (SUTURE) ×2 IMPLANT
SUT VIC AB 3-0 SH 8-18 (SUTURE) ×3 IMPLANT
TAPE CLOTH 3X10 TAN LF (GAUZE/BANDAGES/DRESSINGS) ×1 IMPLANT
TOWEL GREEN STERILE (TOWEL DISPOSABLE) ×2 IMPLANT
TOWEL GREEN STERILE FF (TOWEL DISPOSABLE) ×2 IMPLANT
WATER STERILE IRR 1000ML POUR (IV SOLUTION) ×2 IMPLANT

## 2018-07-27 NOTE — Plan of Care (Signed)
  Problem: Education: Goal: Ability to verbalize activity precautions or restrictions will improve Outcome: Progressing Goal: Knowledge of the prescribed therapeutic regimen will improve Outcome: Progressing Goal: Understanding of discharge needs will improve Outcome: Progressing   Problem: Activity: Goal: Ability to avoid complications of mobility impairment will improve Outcome: Progressing Goal: Ability to tolerate increased activity will improve Outcome: Progressing Goal: Will remain free from falls Outcome: Progressing   Problem: Pain Management: Goal: Pain level will decrease Outcome: Progressing   Problem: Skin Integrity: Goal: Will show signs of wound healing Outcome: Progressing

## 2018-07-27 NOTE — Brief Op Note (Signed)
07/27/2018  12:03 PM  PATIENT:  Monique Weiss  63 y.o. female  PRE-OPERATIVE DIAGNOSIS:  Cervical stenosis of spinal canal  POST-OPERATIVE DIAGNOSIS:  Cervical stenosis of spinal canal  PROCEDURE:  Procedure(s): Cervical five Anterior cervical corpectomy with Cervical six-seven Anterior cervical discectomy and fusion (N/A)  SURGEON:  Surgeon(s) and Role:    * Judith Part, MD - Primary    * Newman Pies, MD - Assisting  PHYSICIAN ASSISTANT:   ANESTHESIA:   general  EBL:  150 mL   BLOOD ADMINISTERED:none  DRAINS: none   LOCAL MEDICATIONS USED:  LIDOCAINE   SPECIMEN:  No Specimen  DISPOSITION OF SPECIMEN:  N/A  COUNTS:  YES  TOURNIQUET:  * No tourniquets in log *  DICTATION: .Note written in EPIC  PLAN OF CARE: Admit for overnight observation  PATIENT DISPOSITION:  PACU - hemodynamically stable.   Delay start of Pharmacological VTE agent (>24hrs) due to surgical blood loss or risk of bleeding: yes

## 2018-07-27 NOTE — H&P (Signed)
Surgical H&P Update  HPI: 63 y.o. woman with cervical myelopathy, here for anterior decompression and fusion. Pt has been having hand weakness, frequent falls, hand numbness, and difficulty with fine motor movement and balance. MRI shows severe cervical stenosis at multiple levels. No changes in health since she was last seen. Still having symptoms and wishes to proceed with surgery.  PMHx:  Past Medical History:  Diagnosis Date  . Anxiety   . Arthritis   . Complication of anesthesia    Hard to wake up  . Diabetes mellitus without complication ([place])    Type II  . Hyperlipidemia   . Hypertension   . Hypothyroidism   . PONV (postoperative nausea and vomiting)   . Thyroid disease    Hypo   FamHx:  Family History  Problem Relation Age of Onset  . Heart disease Mother   . Diabetes Mother   . Hypertension Mother   . Heart failure Mother   . Heart attack Father   . Prostate cancer Father   . Heart disease Sister   . Hypertension Sister   . Diabetes Sister   . Heart attack Brother   . Heart disease Brother   . Hypertension Brother   . Diabetes Brother    SocHx:  reports that she has never smoked. She has never used smokeless tobacco. She reports that she does not drink alcohol or use drugs.  Physical Exam: AOx3, PERRL, FS, TM  Strength 5/5 in BLE, BUE 4+/5 proximally, 4/5 distally, SILTx4 except diffuse numbness in the distal BUE, +hoffman's sign  Assesment/Plan: 63 y.o. woman with cervical myelopathy, here for C5 corpectomy, C6-7 ACDF. Risks, benefits, and alternatives discussed and the patient would like to continue with surgery. -OR today -3C post-op  Judith Part, MD 07/27/18 6:44 AM

## 2018-07-27 NOTE — Transfer of Care (Signed)
Immediate Anesthesia Transfer of Care Note  Patient: Monique Weiss  Procedure(s) Performed: Cervical five Anterior cervical corpectomy with Cervical six-seven Anterior cervical discectomy and fusion (N/A )  Patient Location: PACU  Anesthesia Type:General  Level of Consciousness: drowsy and patient cooperative  Airway & Oxygen Therapy: Patient Spontanous Breathing and Patient connected to nasal cannula oxygen  Post-op Assessment: Report given to RN, Post -op Vital signs reviewed and stable and Patient moving all extremities X 4  Post vital signs: Reviewed and stable  Last Vitals:  Vitals Value Taken Time  BP 127/84 07/27/2018 12:17 PM  Temp    Pulse 93 07/27/2018 12:17 PM  Resp 23 07/27/2018 12:18 PM  SpO2 93 % 07/27/2018 12:17 PM  Vitals shown include unvalidated device data.  Last Pain:  Vitals:   07/27/18 0631  TempSrc:   PainSc: 4       Patients Stated Pain Goal: 3 (14/70/92 9574)  Complications: No apparent anesthesia complications

## 2018-07-27 NOTE — Evaluation (Signed)
Physical Therapy Evaluation Patient Details Name: Monique Weiss MRN: [id number] DOB: 1956/04/08 Today's Date: 07/27/2018   History of Present Illness  63 y.o. woman with cervical myelopathy s/p C5 corpectomy, C6-7 ACDF  Clinical Impression  Patient seen for mobility assessment s/p spinal surgery. Mobilizing well. Educated patient on precautions, mobility expectations, safety and car transfers. Performed stair negotiation. No further acute PT needs. Feel patient may benefit from outpatient PT once cleared for increased activity. Patient will have necessary supervision available at home. Will sign off acutely.    Follow Up Recommendations Outpatient PT;Supervision for mobility/OOB(outpatient PT when cleared for activity)    Equipment Recommendations  None recommended by PT    Recommendations for Other Services       Precautions / Restrictions Precautions Precautions: Cervical Precaution Booklet Issued: Yes (comment) Precaution Comments: verbally reviewed with patient Restrictions Weight Bearing Restrictions: No      Mobility  Bed Mobility Overal bed mobility: Needs Assistance Bed Mobility: Supine to Sit     Supine to sit: Min guard     General bed mobility comments: min guard for safety with VCs for positioning and technique  Transfers Overall transfer level: Needs assistance Equipment used: Rolling walker (2 wheeled) Transfers: Sit to/from Stand Sit to Stand: Supervision         General transfer comment: Supervision for safety, modifed hand placement with one hand on RW during power up to stand. increased effort noted  Ambulation/Gait Ambulation/Gait assistance: Supervision Gait Distance (Feet): 240 Feet Assistive device: Rolling walker (2 wheeled) Gait Pattern/deviations: Decreased stride length Gait velocity: decreased Gait velocity interpretation: <1.8 ft/sec, indicate of risk for recurrent falls General Gait Details: VCs for increased cadence, steady with  use of RW  Stairs Stairs: Yes Stairs assistance: Min assist Stair Management: One rail Right Number of Stairs: 3 General stair comments: Vcs for technique for blind negotiation, min assist for stability  Wheelchair Mobility    Modified Rankin (Stroke Patients Only)       Balance Overall balance assessment: Mild deficits observed, not formally tested                                           Pertinent Vitals/Pain Pain Assessment: Faces Pain Score: 6  Faces Pain Scale: Hurts even more Pain Location: back of neck and head Pain Descriptors / Indicators: Constant;Discomfort;Grimacing Pain Intervention(s): Repositioned;Monitored during session    [place] expects to be discharged to:: Private residence Living Arrangements: Children Available Help at Discharge: Family;Available 24 hours/day Type of Home: House Home Access: Stairs to enter Entrance Stairs-Rails: Can reach both Entrance Stairs-Number of Steps: 3 Home Layout: Able to live on main level with bedroom/bathroom Home Equipment: Walker - 2 wheels      Prior Function Level of Independence: Independent with assistive device(s)         Comments: was using RW for mobility     Hand Dominance   Dominant Hand: Right    Extremity/Trunk Assessment   Upper Extremity Assessment Upper Extremity Assessment: Generalized weakness(reports bilateral paresthesias in hands)    Lower Extremity Assessment Lower Extremity Assessment: Generalized weakness(improved sensation bilaterally)       Communication      Cognition Arousal/Alertness: Awake/alert Behavior During Therapy: Flat affect Overall Cognitive Status: Within Functional Limits for tasks assessed  General Comments      Exercises     Assessment/Plan    PT Assessment All further PT needs can be met in the next venue of care  PT Problem List Decreased  strength;Decreased activity tolerance;Decreased balance;Decreased mobility       PT Treatment Interventions      PT Goals (Current goals can be found in the Care Plan section)  Acute Rehab PT Goals PT Goal Formulation: All assessment and education complete, DC therapy    Frequency     Barriers to discharge        Co-evaluation               AM-PAC PT "6 Clicks" Mobility  Outcome Measure Help needed turning from your back to your side while in a flat bed without using bedrails?: None Help needed moving from lying on your back to sitting on the side of a flat bed without using bedrails?: None Help needed moving to and from a bed to a chair (including a wheelchair)?: None Help needed standing up from a chair using your arms (e.g., wheelchair or bedside chair)?: A Little Help needed to walk in hospital room?: A Little Help needed climbing 3-5 steps with a railing? : A Little 6 Click Score: 21    End of Session Equipment Utilized During Treatment: Gait belt Activity Tolerance: Patient tolerated treatment well Patient left: in bed;with call bell/phone within reach;with family/visitor present Nurse Communication: Mobility status PT Visit Diagnosis: Difficulty in walking, not elsewhere classified (R26.2)    Time: 7183-6725 PT Time Calculation (min) (ACUTE ONLY): 20 min   Charges:   PT Evaluation $PT Eval Low Complexity: [place], PT DPT  Board Certified Neurologic Specialist Acute Rehabilitation Services Pager [phone] Office [place] 07/27/2018, 5:14 PM

## 2018-07-27 NOTE — Anesthesia Procedure Notes (Addendum)
Procedure Name: Intubation Date/Time: 07/27/2018 7:36 AM Performed by: Orlie Dakin, CRNA Pre-anesthesia Checklist: Patient identified, Emergency Drugs available, Suction available and Patient being monitored Patient Re-evaluated:Patient Re-evaluated prior to induction Oxygen Delivery Method: Circle system utilized Preoxygenation: Pre-oxygenation with 100% oxygen Induction Type: IV induction Ventilation: Mask ventilation without difficulty Laryngoscope Size: Glidescope and 3 Grade View: Grade I Tube type: Oral Tube size: 7.0 mm Number of attempts: 1 Airway Equipment and Method: Stylet and Video-laryngoscopy Placement Confirmation: ETT inserted through vocal cords under direct vision,  positive ETCO2 and breath sounds checked- equal and bilateral Secured at: 22 cm Tube secured with: Tape Dental Injury: Teeth and Oropharynx as per pre-operative assessment  Comments: Soft 4x4s oral bite block placed.

## 2018-07-27 NOTE — Anesthesia Postprocedure Evaluation (Signed)
Anesthesia Post Note  Patient: Monique Weiss  Procedure(s) Performed: Cervical five Anterior cervical corpectomy with Cervical six-seven Anterior cervical discectomy and fusion (N/A )     Patient location during evaluation: PACU Anesthesia Type: General Level of consciousness: awake and alert Pain management: pain level controlled Vital Signs Assessment: post-procedure vital signs reviewed and stable Respiratory status: spontaneous breathing, nonlabored ventilation, respiratory function stable and patient connected to nasal cannula oxygen Cardiovascular status: blood pressure returned to baseline and stable Postop Assessment: no apparent nausea or vomiting Anesthetic complications: no    Last Vitals:  Vitals:   07/27/18 1402 07/27/18 1642  BP: 139/86 137/83  Pulse: 96 (!) 103  Resp: 18 16  Temp: 36.9 C 36.6 C  SpO2: 92% 94%    Last Pain:  Vitals:   07/27/18 1654  TempSrc:   PainSc: 8                  Deidre Carino,W. EDMOND

## 2018-07-27 NOTE — Progress Notes (Signed)
Neurosurgery Service Post-operative progress note  Assessment & Plan: 63 y.o. woman with cervical myelopathy s/p C5 corpectomy, C6-7 ACDF, seen in PACU and MAEx4 with stable strength from preop, recovering well. -admit to [facility]  07/27/18 12:58 PM

## 2018-07-27 NOTE — Op Note (Signed)
PATIENT: Monique Weiss  PROCEDURE DATE: 07/27/18  PRE-OPERATIVE DIAGNOSIS:  Cervical myelopathy   POST-OPERATIVE DIAGNOSIS:  Cervical myelopathy   PROCEDURE:  C5 Anterior Cervical Corpectomy, C6-C7 Anterior Cervical Discectomy, C4-C7 Anterior Instrumented Fusion, use of intraoperative neuromonitoring   SURGEON:  Surgeon(s) and Role:    Judith Part, MD - Primary    Newman Pies, MD - Assisting   ANESTHESIA: ETGA   BRIEF HISTORY: This is a 63 year old woman who presented with hand clumsiness, poor balance, hand numbness, and neck pain. The patient was found to have severe cervical stenosis. Given the extent of the compression behind the body of C5, it was decided that a corpectomy should be performed at that level to properly decompress the cord at that level. This was discussed with the patient as well as risks, benefits, and alternatives and the patient wished to proceed with surgical treatment.   OPERATIVE DETAIL: The patient was taken to the operating room and placed on the OR table in the supine position. A formal time out was performed with two patient identifiers and confirmed the operative site. Anesthesia was induced by the anesthesia team.  Fluoroscopy was used to localize the surgical level and an incision was marked in a skin crease. The area was then prepped and draped in a sterile fashion. A transverse linear incision was made on the right side of the neck. The platysma was divided and the sternocleidomastoid muscle was identified. The carotid sheath was palpated, identified, and retracted laterally with the sternocleidomastoid muscle. The strap muscles were identified and retracted medially and the pretracheal fascia was entered. A bent spinal needle was used with fluoroscopy to localize the surgical level after dissection. The longus colli were elevated bilaterally and a self-retaining retractor was placed. The endotracheal tube cuff balloon was deflated and reinflated  after retractor placement.   Anterior osteophytes were removed until flush with the anterior vertebral body. The disc annulus was incised and a complete C4-C5 discectomy was performed. The posterior longitudinal ligament was incised followed by ligamentous and bony removal until no central canal stenosis was present. Decompression was then taken out laterally into the bilateral foramina until no foraminal stenosis was palpable. A discectomy was then performed at C5-6 with the same technique as above. The C5 vertebral body was removed with rongeurs to provide autograft, then the ligamentum flavum was resected. Following the C5 corpectomy, it was noted that her SSEPs and MEPs were significantly improved, which was encouraging that she had a good decompression at that level and would likely recover function. A PEEK corpectomy cage (Medtronic) was inserted into the disc space as an interbody graft and fluoroscopy confirmed good positioning. Attention was then turned to C6-7, where a discectomy was performed with the same technique as above. A 69mm cortical allograft ([employer]tr[employer]) was placed after complete endplate preparation. An anterior plate (Medtronic) was positioned and 6, 39mm screws were used to secure the plate to the C4, C6, and C7 vertebral bodies. Fluoroscopy confirmed good position of the plate and screws. Hemostasis was obtained and the incision was closed in layers. All instrument and sponge counts were correct. The patient was then returned to anesthesia for emergence. No apparent complications at the completion of the procedure. Final MEP/SSEPs were improved from preop baseline.   EBL:  218mL   DRAINS: none   SPECIMENS: none   Judith Part, MD 07/27/18 12:11 PM

## 2018-07-28 ENCOUNTER — Encounter (HOSPITAL_COMMUNITY): Payer: Self-pay | Admitting: Neurological Surgery

## 2018-07-28 LAB — GLUCOSE, CAPILLARY: Glucose-Capillary: 105 mg/dL — ABNORMAL HIGH (ref 70–99)

## 2018-07-28 MED ORDER — OXYCODONE HCL 5 MG PO TABS: 5.0000 mg | ORAL_TABLET | ORAL | 0 refills | Status: DC | PRN

## 2018-07-28 MED ORDER — ONDANSETRON HCL 4 MG PO TABS: 4.0000 mg | ORAL_TABLET | Freq: Three times a day (TID) | ORAL | 1 refills | Status: DC | PRN

## 2018-07-28 MED ORDER — DOCUSATE SODIUM 100 MG PO CAPS: 100.0000 mg | ORAL_CAPSULE | Freq: Every day | ORAL | 2 refills | Status: DC | PRN

## 2018-07-28 NOTE — Progress Notes (Signed)
Pt doing well. Pt given D/C instructions with verbal understanding. Pt's incision is clean and dry with no sign of infection. Pt's IV was removed prior to D/C. Pt D/C'd home via wheelchair per MD order. Pt is stable @ D/C and has no other needs at this time. Samyia Motter, RN  

## 2018-07-28 NOTE — Discharge Instructions (Signed)
Discharge Instructions ° °No restriction in activities, slowly increase your activity back to normal.  ° °Your incision is closed with dermabond (purple glue). This will naturally fall off over the next 1-2 weeks.  ° °Okay to shower on the day of discharge. Use regular soap and water and try to be gentle when cleaning your incision.  ° °Follow up with Dr. Constantin Hillery in 2 weeks after discharge. If you do not already have a discharge appointment, please call his office at [phone] to schedule a follow up appointment. If you have any concerns or questions, please call the office and let us know. °

## 2018-07-28 NOTE — Discharge Summary (Signed)
Discharge Summary  Date of Admission: 07/27/2018  Date of Discharge: 07/28/18  Attending Physician: Emelda Brothers, MD  Hospital Course: Patient was admitted following an uncomplicated C5 corpectomy and C6-7 ACDF for cervical myelopathy. She was recovered in PACU and transferred to [facility]. Her hospital course was uncomplicated and the patient was discharged home on 07/28/2018. She will follow up in clinic with me in 2 weeks.  Neurologic exam at discharge:  AOx3, PERRL, EOMI, FS, Strength 5/5 in BLE, 4/5 in LUE, 4+/5 in RUE, SILT in BLE, decreased diffusely in BUE worst in the hands Incision c/d/i w/ dermabond  Judith Part, MD 07/28/18 9:40 AM

## 2018-07-28 NOTE — Progress Notes (Signed)
Neurosurgery Service Progress Note  Subjective: No acute events overnight, neck pain improved from preop, some throat soreness, hands feel better already   Objective: Vitals:   07/27/18 1938 07/28/18 0020 07/28/18 0325 07/28/18 0743  BP: 126/82 108/61 (!) 103/55 116/66  Pulse: 100 92 84 82  Resp: 16 20 18 16   Temp: 98 F (36.7 C) 98.1 F (36.7 C) 98 F (36.7 C) 98 F (36.7 C)  TempSrc: Oral Oral Oral Oral  SpO2: 92% 96% 94% 96%  Weight:      Height:       Temp (24hrs), Avg:97.8 F (36.6 C), Min:97 F (36.1 C), Max:98.4 F (36.9 C)  CBC Latest Ref Rng & Units 07/20/2018 01/15/2018 01/14/2018  WBC 4.0 - 10.5 K/uL 8.6 12.5(H) 8.4  Hemoglobin 12.0 - 15.0 g/dL 14.2 12.0 12.9  Hematocrit 36.0 - 46.0 % 42.9 37.9 40.1  Platelets 150 - 400 K/uL 248 179 170   BMP Latest Ref Rng & Units 07/20/2018 01/15/2018 01/14/2018  Glucose 70 - 99 mg/dL 203(H) 128(H) 119(H)  BUN 8 - 23 mg/dL 20 12 9   Creatinine 0.44 - 1.00 mg/dL 0.70 0.67 0.61  Sodium 135 - 145 mmol/L 140 142 141  Potassium 3.5 - 5.1 mmol/L 3.1(L) 4.1 3.9  Chloride 98 - 111 mmol/L 102 104 102  CO2 22 - 32 mmol/L 26 31 33(H)  Calcium 8.9 - 10.3 mg/dL 9.7 8.4(L) 8.8(L)    Intake/Output Summary (Last 24 hours) at 07/28/2018 0933 Last data filed at 07/27/2018 1812 Gross per 24 hour  Intake 1956.27 ml  Output 150 ml  Net 1806.27 ml    Current Facility-Administered Medications:  .  0.9 %  sodium chloride infusion, 250 mL, Intravenous, Continuous, Khushbu Pippen, Joyice Faster, MD, Last Rate: 50 mL/hr at 07/27/18 1504 .  acetaminophen (TYLENOL) tablet 650 mg, 650 mg, Oral, Q4H PRN, 650 mg at 07/28/18 0919 **OR** acetaminophen (TYLENOL) suppository 650 mg, 650 mg, Rectal, Q4H PRN, Judith Part, MD .  atorvastatin (LIPITOR) tablet 40 mg, 40 mg, Oral, QHS, Corrine Tillis, Joyice Faster, MD, 40 mg at 07/27/18 2125 .  cyclobenzaprine (FLEXERIL) tablet 10 mg, 10 mg, Oral, TID PRN, Judith Part, MD, 10 mg at 07/27/18 1645 .  furosemide (LASIX)  tablet 80 mg, 80 mg, Oral, Daily, Judith Part, MD, 80 mg at 07/28/18 0919 .  HYDROmorphone (DILAUDID) injection 0.5 mg, 0.5 mg, Intravenous, Q3H PRN, Judith Part, MD, 0.5 mg at 07/27/18 1644 .  insulin aspart (novoLOG) injection 0-15 Units, 0-15 Units, Subcutaneous, TID WC, Jhade Berko A, MD .  insulin aspart (novoLOG) injection 0-5 Units, 0-5 Units, Subcutaneous, QHS, Jedadiah Abdallah A, MD .  levothyroxine (SYNTHROID, LEVOTHROID) tablet 75 mcg, 75 mcg, Oral, QAC breakfast, Judith Part, MD, 75 mcg at 07/28/18 0527 .  linaclotide (LINZESS) capsule 145 mcg, 145 mcg, Oral, Daily PRN, Lance Huaracha A, MD .  menthol-cetylpyridinium (CEPACOL) lozenge 3 mg, 1 lozenge, Oral, PRN **OR** phenol (CHLORASEPTIC) mouth spray 1 spray, 1 spray, Mouth/Throat, PRN, Samuell Knoble A, MD .  metFORMIN (GLUCOPHAGE) tablet 1,000 mg, 1,000 mg, Oral, BID WC, Adiva Boettner, Joyice Faster, MD, 1,000 mg at 07/28/18 0919 .  ondansetron (ZOFRAN) tablet 4 mg, 4 mg, Oral, Q6H PRN, 4 mg at 07/28/18 0412 **OR** ondansetron (ZOFRAN) injection 4 mg, 4 mg, Intravenous, Q6H PRN, Judith Part, MD, 4 mg at 07/27/18 1949 .  oxyCODONE (Oxy IR/ROXICODONE) immediate release tablet 10 mg, 10 mg, Oral, Q4H PRN, Judith Part, MD, 10 mg at 07/28/18 0410 .  oxyCODONE (Oxy IR/ROXICODONE) immediate release tablet 5 mg, 5 mg, Oral, Q4H PRN, Judith Part, MD, 5 mg at 07/27/18 1330 .  promethazine (PHENERGAN) tablet 12.5-25 mg, 12.5-25 mg, Oral, Q6H PRN, Judith Part, MD .  sertraline (ZOLOFT) tablet 50 mg, 50 mg, Oral, Daily, Bijan Ridgley, Joyice Faster, MD, 50 mg at 07/28/18 0919 .  sodium chloride flush (NS) 0.9 % injection 3 mL, 3 mL, Intravenous, Q12H, Zeddie Njie A, MD .  sodium chloride flush (NS) 0.9 % injection 3 mL, 3 mL, Intravenous, PRN, Judith Part, MD .  spironolactone (ALDACTONE) tablet 25 mg, 25 mg, Oral, Daily, Marques Ericson, Joyice Faster, MD, 25 mg at 07/28/18 0919   Physical  Exam: AOx3, PERRL, EOMI, FS, Strength 5/5 in BLE, 4/5 in LUE, 4+/5 in RUE, SILT in BLE, decreased diffusely in BUE worst in the hands Incision c/d/i w/ dermabond  Assessment & Plan: 63 y.o. woman s/p C5 corpectomy, C6-7 ACDF, recovering well. -discharge home this morning  Judith Part  07/28/18 9:33 AM

## 2018-07-28 NOTE — Evaluation (Signed)
Occupational Therapy Evaluation Patient Details Name: Monique Weiss MRN: [id number] DOB: 12/05/1955 Today's Date: 07/28/2018    History of Present Illness 63 y.o. woman with cervical myelopathy s/p C5 corpectomy, C6-7 ACDF. PMH: anxiety, arthritis, HTN, DM type II.    Clinical Impression   PTA patient reports independent with ADLs, some assist with IADls and used RW for mobility.  Patient admitted for above and limited by decreased B UE hand strength/sensation, precautions and activity tolerance. Patient educated on precautions, ADL compensatory techniques, DME recommendations and safety. Patient able to complete UB ADLs with setup assist, LB ADLs with min assist, transfers with supervision using RW.  She is agreeable to place 3:1 in shower as shower chair, and reports family can assist as needed with ADLs/IADLs.  Patient will benefit from continued OT services while admitted in order to optimize independence and precaution recall, but anticipate no further OT needs after dc.     Follow Up Recommendations  No OT follow up;Supervision - Intermittent    Equipment Recommendations  None recommended by OT    Recommendations for Other Services       Precautions / Restrictions Precautions Precautions: Cervical Precaution Booklet Issued: Yes (comment) Precaution Comments: reviewed precautions with patient, requires cueing to recall  Required Braces or Orthoses: (no brace needed order) Restrictions Weight Bearing Restrictions: No      Mobility Bed Mobility               General bed mobility comments: seated EOB upon entry  Transfers Overall transfer level: Needs assistance Equipment used: Rolling walker (2 wheeled) Transfers: Sit to/from Stand Sit to Stand: Supervision         General transfer comment: supervision for safety, cueing for hand placement     Balance Overall balance assessment: Mild deficits observed, not formally tested                                          ADL either performed or assessed with clinical judgement   ADL Overall ADL's : Needs assistance/impaired     Grooming: Supervision/safety;Standing;Cueing for compensatory techniques   Upper Body Bathing: Set up;Sitting;Cueing for compensatory techniques   Lower Body Bathing: Set up;Sitting/lateral leans;Cueing for compensatory techniques;With adaptive equipment Lower Body Bathing Details (indicate cue type and reason): reviewed compensatory techniques seated, reports using Long sponge  Upper Body Dressing : Set up;Sitting;Cueing for compensatory techniques Upper Body Dressing Details (indicate cue type and reason): reviewed compensatory techniques  Lower Body Dressing: Minimal assistance;Sit to/from stand Lower Body Dressing Details (indicate cue type and reason): reviewed compensatory techniques, using figure 4 and donning L LE first in clothing and needs a little assist with socks (but does not wear them typically) Toilet Transfer: Supervision/safety;Ambulation;RW Toilet Transfer Details (indicate cue type and reason): simulated in room, cueing for hand placemet  Toileting- Clothing Manipulation and Hygiene: Supervision/safety;Sit to/from stand   Tub/ Shower Transfer: Supervision/safety;Ambulation;3 in 1;Rolling walker Tub/Shower Transfer Details (indicate cue type and reason): reviewed reverse step using RW into shower to 3:1 as shower chair  Functional mobility during ADLs: Supervision/safety;Rolling walker General ADL Comments: patient verbalizes understanding of compensatory techniques for self care, body mechanics and energy conservation techniques; reports will have assistance as needed     Vision   Vision Assessment?: No apparent visual deficits     Perception     Praxis      Pertinent Vitals/Pain  Pain Assessment: Faces Faces Pain Scale: Hurts little more Pain Location: neck Pain Descriptors / Indicators: Constant;Discomfort Pain Intervention(s):  Monitored during session;Repositioned     Hand Dominance Right   Extremity/Trunk Assessment Upper Extremity Assessment Upper Extremity Assessment: Generalized weakness(B hand paresthesias (FF to 90 only))   Lower Extremity Assessment Lower Extremity Assessment: Defer to PT evaluation   Cervical / Trunk Assessment Cervical / Trunk Assessment: Other exceptions Cervical / Trunk Exceptions: s/p cervical sx   Communication Communication Communication: No difficulties   Cognition Arousal/Alertness: Awake/alert Behavior During Therapy: Flat affect Overall Cognitive Status: Within Functional Limits for tasks assessed                                 General Comments: appears [facility]    General Comments       Exercises     Shoulder Instructions      Home Living Family/patient expects to be discharged to:: Private residence Living Arrangements: Children Available Help at Discharge: Family;Available 24 hours/day Type of Home: House Home Access: Stairs to enter [employer] of Steps: 3 Entrance Stairs-Rails: Can reach both Home Layout: Able to live on main level with bedroom/bathroom     Bathroom Shower/Tub: Occupational psychologist: [employer] manager)     Home Equipment: Environmental consultant - 2 wheels;Bedside commode          Prior Functioning/Environment Level of Independence: Independent with assistive device(s)        Comments: was using RW for mobility, needed assist for socks but did not wear them usually (does not drive)        OT Problem List: Decreased strength;Decreased activity tolerance;Impaired balance (sitting and/or standing);Pain;Decreased knowledge of precautions;Decreased knowledge of use of DME or AE;Decreased safety awareness      OT Treatment/Interventions: Self-care/ADL training;DME and/or AE instruction;Balance training;Patient/family education;Therapeutic activities    OT Goals(Current goals can be found in the care plan  section) Acute Rehab OT Goals Patient Stated Goal: to get home  OT Goal Formulation: With patient Time For Goal Achievement: 08/11/18 Potential to Achieve Goals: Good  OT Frequency: Min 2X/week   Barriers to D/C:            Co-evaluation              AM-PAC OT "6 Clicks" Daily Activity     Outcome Measure Help from another person eating meals?: None Help from another person taking care of personal grooming?: None Help from another person toileting, which includes using toliet, bedpan, or urinal?: None Help from another person bathing (including washing, rinsing, drying)?: A Little Help from another person to put on and taking off regular upper body clothing?: None Help from another person to put on and taking off regular lower body clothing?: A Little 6 Click Score: 22   End of Session Equipment Utilized During Treatment: Rolling walker Nurse Communication: Mobility status  Activity Tolerance: Patient tolerated treatment well Patient left: with call bell/phone within reach;Other (comment)(seated EOB)  OT Visit Diagnosis: Other abnormalities of gait and mobility (R26.89);Muscle weakness (generalized) (M62.81);Pain Pain - part of body: (neck)                Time: 1017-5102 OT Time Calculation (min): 18 min Charges:  OT General Charges $OT Visit: 1 Visit OT Evaluation $OT Eval Low Complexity: 1 Low  Delight Stare, OT Acute Rehabilitation Services Pager [phone] Office [place] 07/28/2018, 8:25  AM

## 2018-08-14 DIAGNOSIS — M4802 Spinal stenosis, cervical region: Secondary | ICD-10-CM | POA: Diagnosis not present

## 2018-09-10 DIAGNOSIS — E1169 Type 2 diabetes mellitus with other specified complication: Secondary | ICD-10-CM | POA: Diagnosis not present

## 2018-11-10 DIAGNOSIS — R5383 Other fatigue: Secondary | ICD-10-CM | POA: Diagnosis not present

## 2018-11-10 DIAGNOSIS — M150 Primary generalized (osteo)arthritis: Secondary | ICD-10-CM | POA: Diagnosis not present

## 2018-11-10 DIAGNOSIS — R768 Other specified abnormal immunological findings in serum: Secondary | ICD-10-CM | POA: Diagnosis not present

## 2018-11-10 DIAGNOSIS — G5603 Carpal tunnel syndrome, bilateral upper limbs: Secondary | ICD-10-CM | POA: Diagnosis not present

## 2018-11-10 DIAGNOSIS — M0609 Rheumatoid arthritis without rheumatoid factor, multiple sites: Secondary | ICD-10-CM | POA: Diagnosis not present

## 2018-11-10 DIAGNOSIS — M542 Cervicalgia: Secondary | ICD-10-CM | POA: Diagnosis not present

## 2018-11-10 DIAGNOSIS — Z6833 Body mass index (BMI) 33.0-33.9, adult: Secondary | ICD-10-CM | POA: Diagnosis not present

## 2018-11-10 DIAGNOSIS — M130 Polyarthritis, unspecified: Secondary | ICD-10-CM | POA: Diagnosis not present

## 2018-11-10 DIAGNOSIS — E669 Obesity, unspecified: Secondary | ICD-10-CM | POA: Diagnosis not present

## 2018-12-03 DIAGNOSIS — M4802 Spinal stenosis, cervical region: Secondary | ICD-10-CM | POA: Diagnosis not present

## 2018-12-07 DIAGNOSIS — N390 Urinary tract infection, site not specified: Secondary | ICD-10-CM | POA: Diagnosis not present

## 2018-12-07 DIAGNOSIS — M069 Rheumatoid arthritis, unspecified: Secondary | ICD-10-CM | POA: Diagnosis not present

## 2019-01-05 DIAGNOSIS — E119 Type 2 diabetes mellitus without complications: Secondary | ICD-10-CM | POA: Diagnosis not present

## 2019-01-12 IMAGING — CT CT CERVICAL SPINE W/O CM
3 of 4 series · 13 of 33 positions shown, 16 images · non-contrast
Comparison: Cervical spine MRI 06/23/2018

CLINICAL DATA: Posterior neck and head pain for 3 months.
Imbalance.

EXAM:
CT CERVICAL SPINE WITHOUT CONTRAST
TECHNIQUE: Multidetector CT imaging of the cervical spine was performed without
intravenous contrast. Multiplanar CT image reconstructions were also
generated.

[Series 5: sagittal bone · sagittal · 0.34mm/px · 5 of 61 slices shown, 6 images]
[im 21/61  bone]
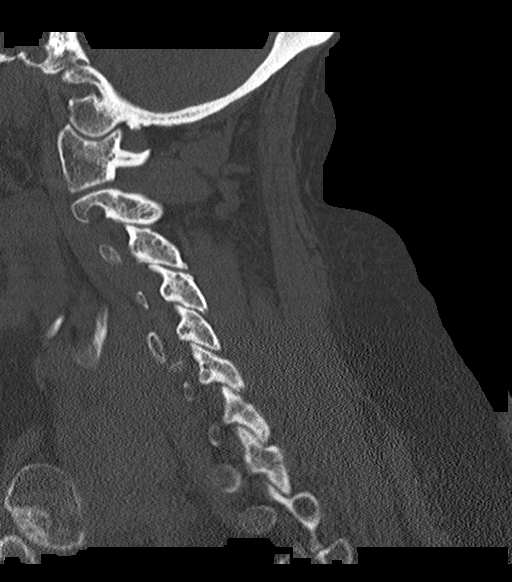
[im 26/61  bone]
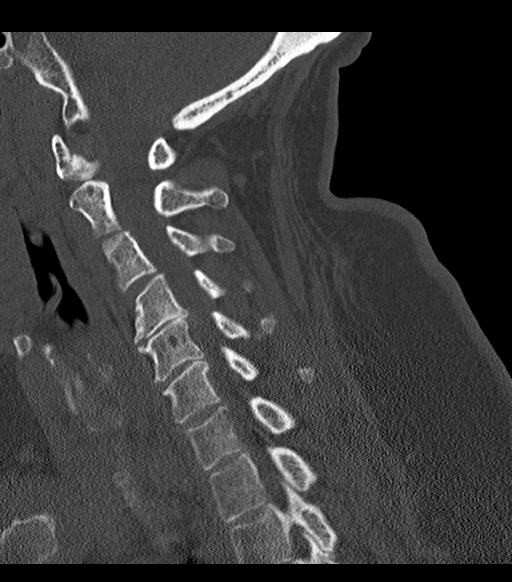
[im 31/61  soft-tissue]
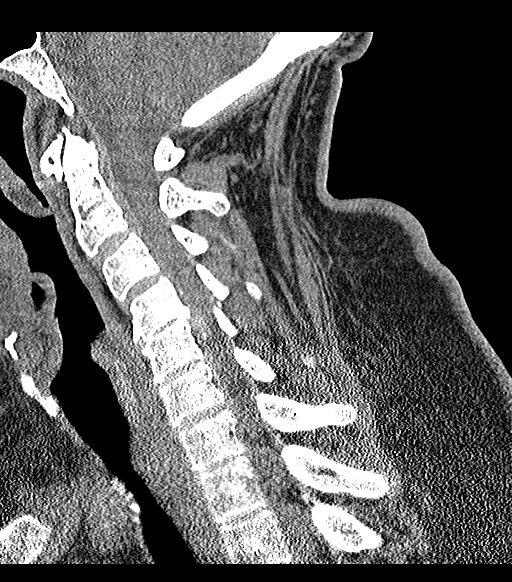
[im 31/61  bone]
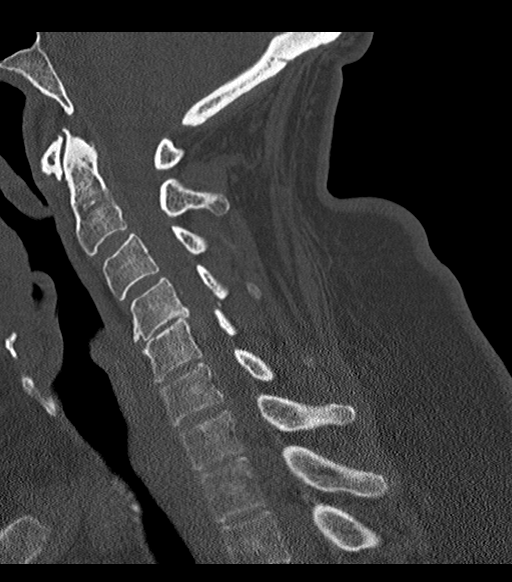
[im 36/61  bone]
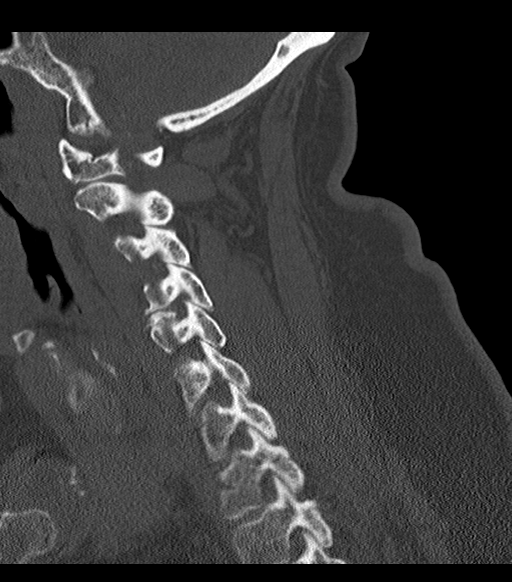
[im 41/61  bone]
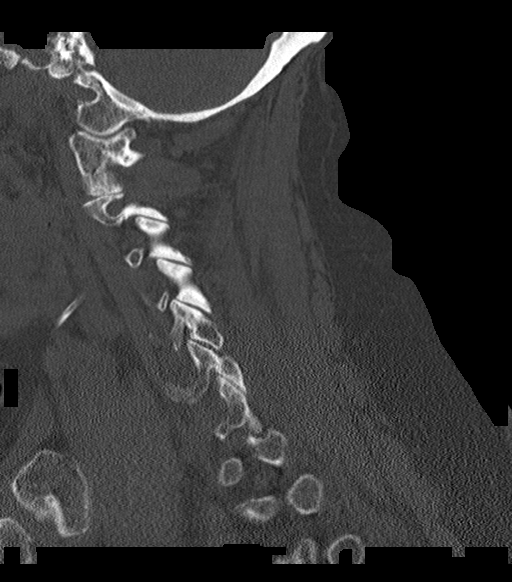

[Series 6: coronal bone · coronal · 0.27mm/px · 3 of 61 slices shown]
[im 13/61  bone]
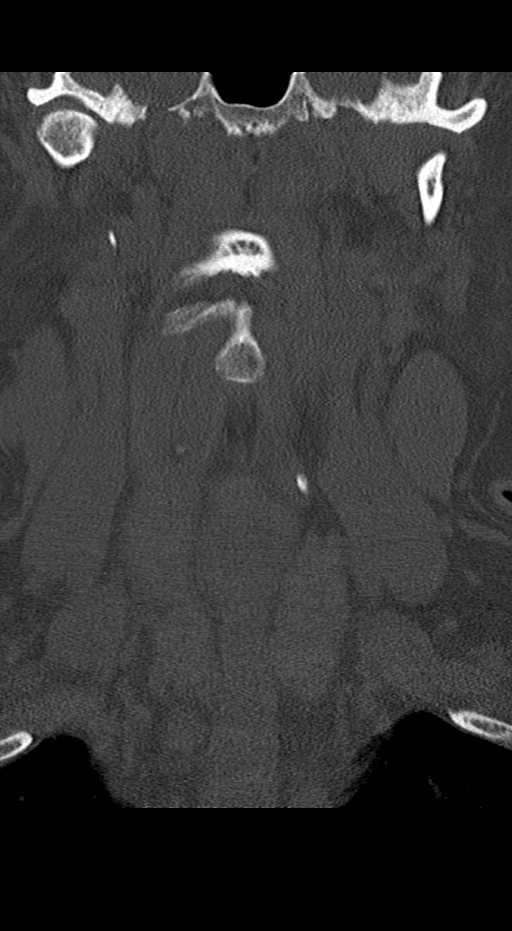
[im 25/61  bone]
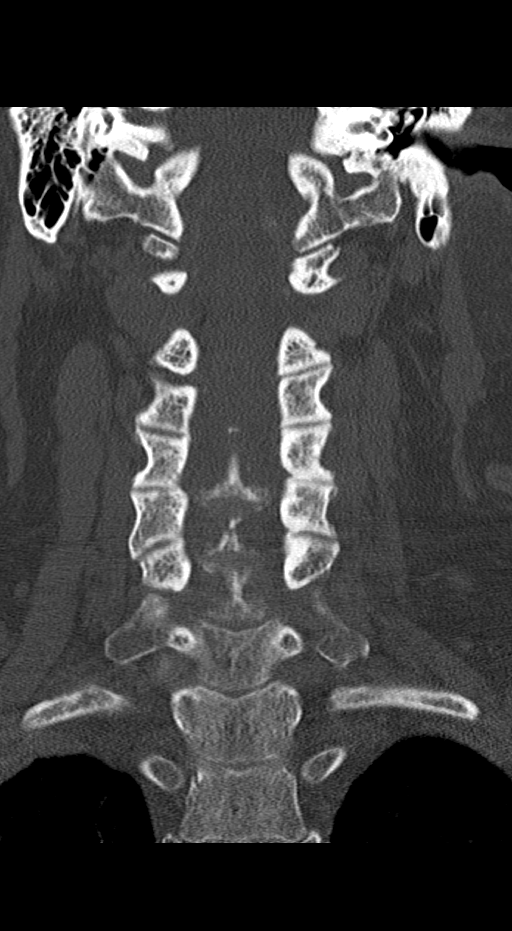
[im 37/61  bone]
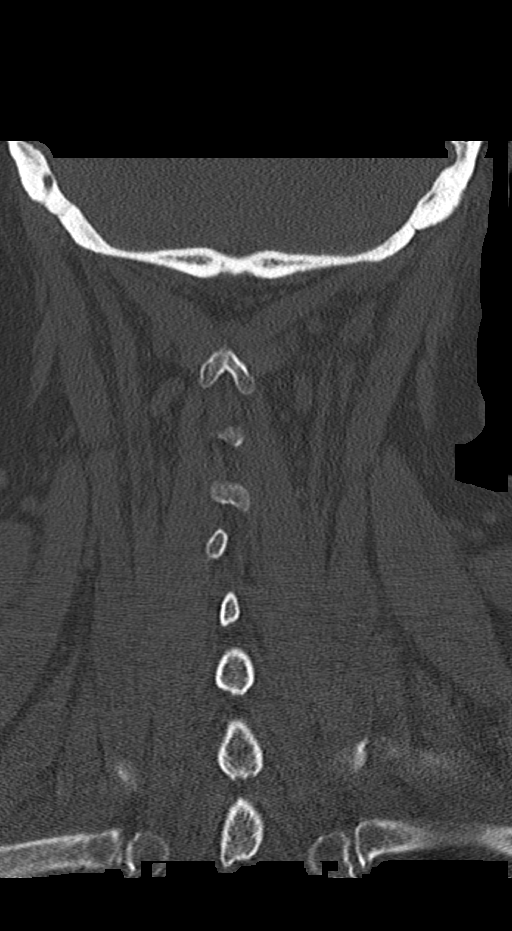

[Series 9: orthogonal bone · axial · 0.21mm/px · z∈[-112,-10]mm · 5 of 90 slices shown, 7 images]
[im 15/90  soft-tissue]
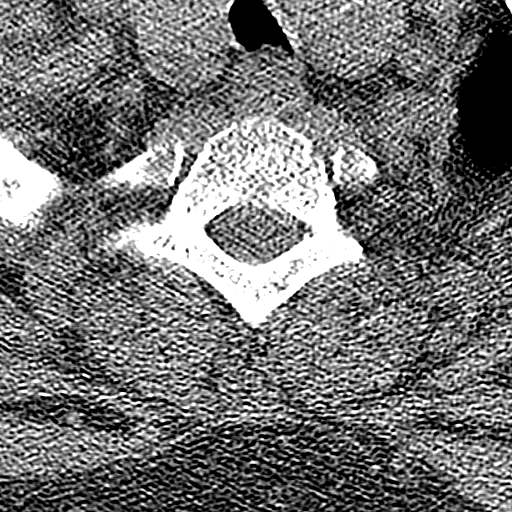
[im 15/90  bone]
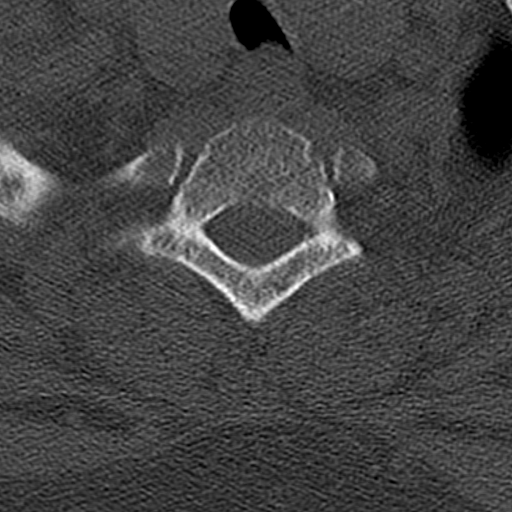
[im 30/90  bone]
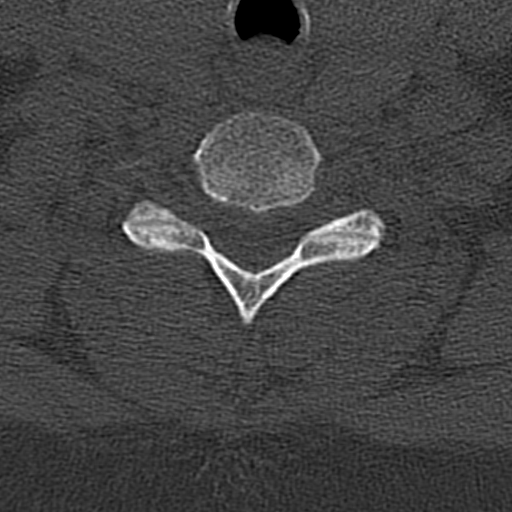
[im 45/90  bone]
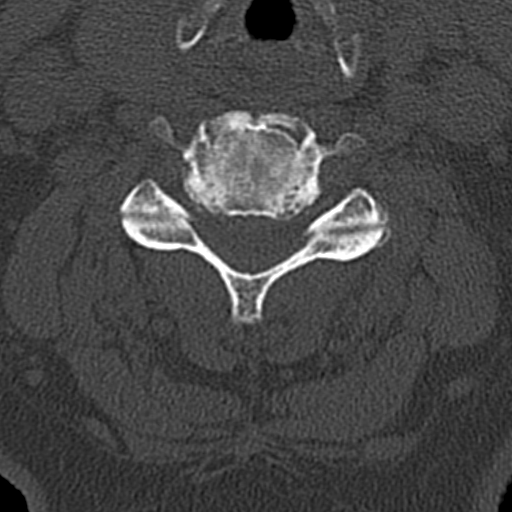
[im 60/90  bone]
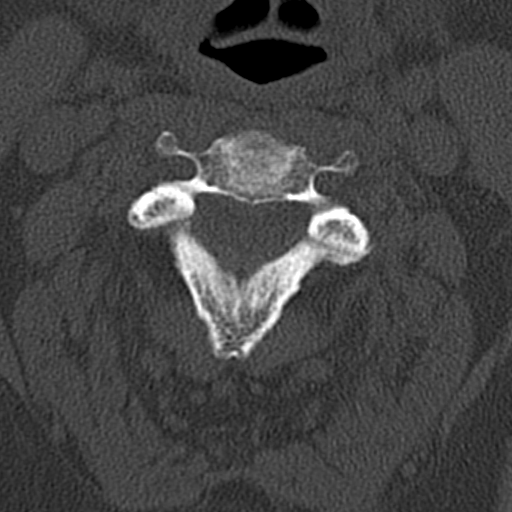
[im 75/90  soft-tissue]
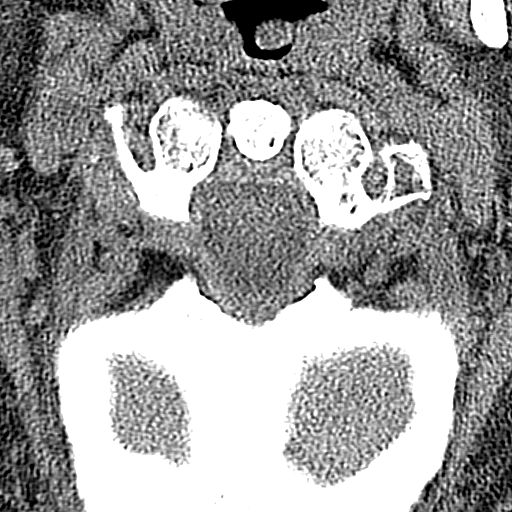
[im 75/90  bone]
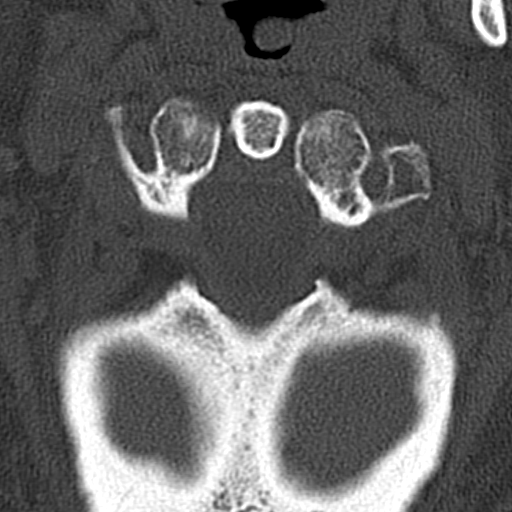

[13 of 33 positions shown; findings below may reference images not displayed]

FINDINGS: Alignment: Straightening/slight reversal of the normal cervical
lordosis. Trace anterolisthesis of C3 on C4 and trace retrolisthesis
of C4 on C5.

Skull base and vertebrae: No acute fracture or destructive osseous
process. Mild median C1-2 arthropathy.

Soft tissues and spinal canal: No prevertebral fluid or swelling. No
visible canal hematoma.

Disc levels: Multilevel cervical disc degeneration including focally
advanced disc space narrowing at C4-5 with detailed level by level
assessment of degenerative changes deferred to recent MRI. No
significant posterior longitudinal ligament ossification is evident
on CT, and the multilevel spinal stenosis described on MRI is due to
multiple prominent disc herniations including a large partially
calcified disc extrusion at C4-5 resulting in severe spinal stenosis
and severe cord compression.

Upper chest: Clear lung apices.

Other: Mild-to-moderate thyromegaly. Punctate calcification in the
left thyroid lobe.
IMPRESSION: Multilevel cervical disc degeneration as described on recent MRI,
worst at C4-5 where there is severe spinal stenosis and severe cord
compression due to a partially calcified disc extrusion. No
significant ossification of the posterior longitudinal ligament.

## 2019-01-14 DIAGNOSIS — N390 Urinary tract infection, site not specified: Secondary | ICD-10-CM | POA: Diagnosis not present

## 2019-01-18 DIAGNOSIS — E1122 Type 2 diabetes mellitus with diabetic chronic kidney disease: Secondary | ICD-10-CM | POA: Diagnosis not present

## 2019-01-18 DIAGNOSIS — H5213 Myopia, bilateral: Secondary | ICD-10-CM | POA: Diagnosis not present

## 2019-01-18 DIAGNOSIS — R609 Edema, unspecified: Secondary | ICD-10-CM | POA: Diagnosis not present

## 2019-01-18 DIAGNOSIS — E785 Hyperlipidemia, unspecified: Secondary | ICD-10-CM | POA: Diagnosis not present

## 2019-02-04 DIAGNOSIS — H52223 Regular astigmatism, bilateral: Secondary | ICD-10-CM | POA: Diagnosis not present

## 2019-02-04 DIAGNOSIS — H5213 Myopia, bilateral: Secondary | ICD-10-CM | POA: Diagnosis not present

## 2019-02-06 IMAGING — RF DG CERVICAL SPINE 2 OR 3 VIEWS
1 series · 3 of 3 positions shown · non-contrast
Comparison: CT scan of July 02, 2018.

CLINICAL DATA: Surgical anterior cervical fusion.

EXAM:
DG C-ARM 61-120 MIN; CERVICAL SPINE - 2-3 VIEW
FLUOROSCOPY TIME:  18 seconds.

[Series 1: run · 3 of 3 slices shown]
[im 1/3]
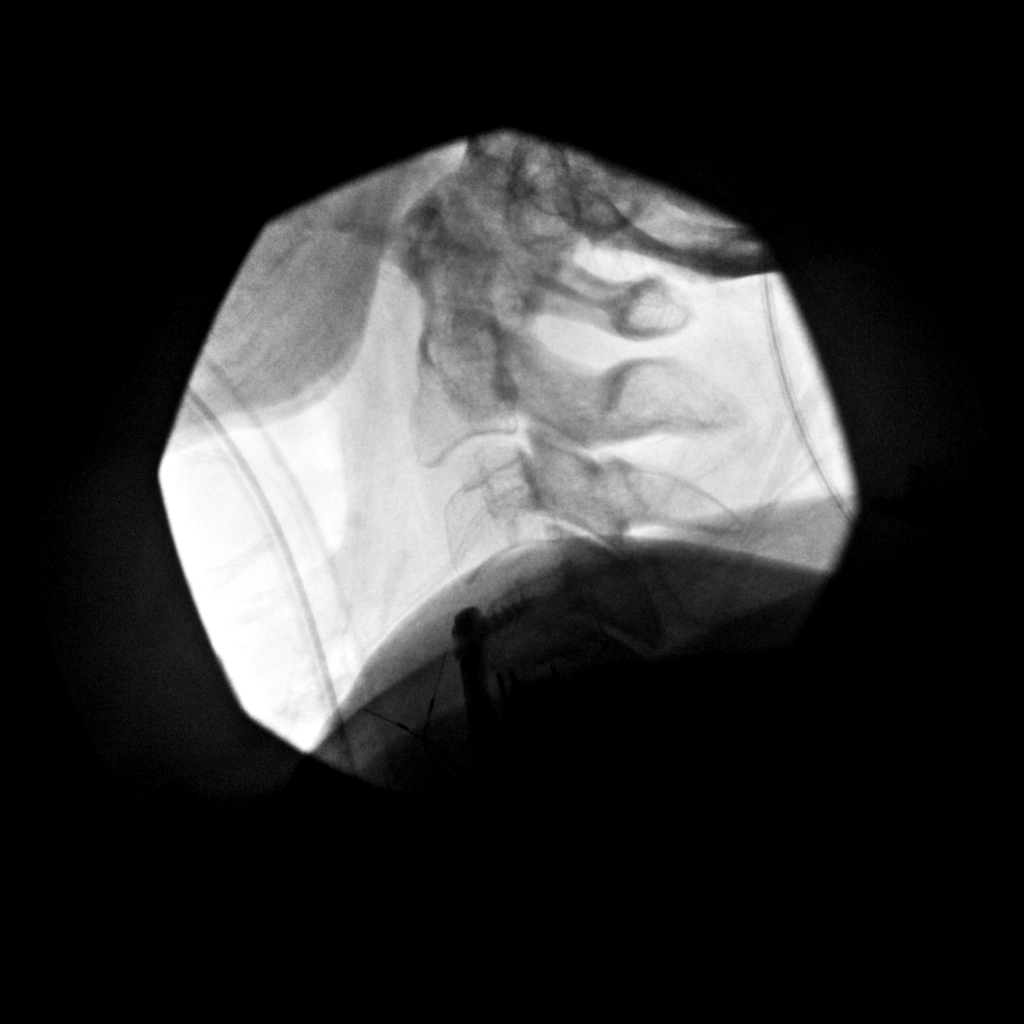
[im 2/3]
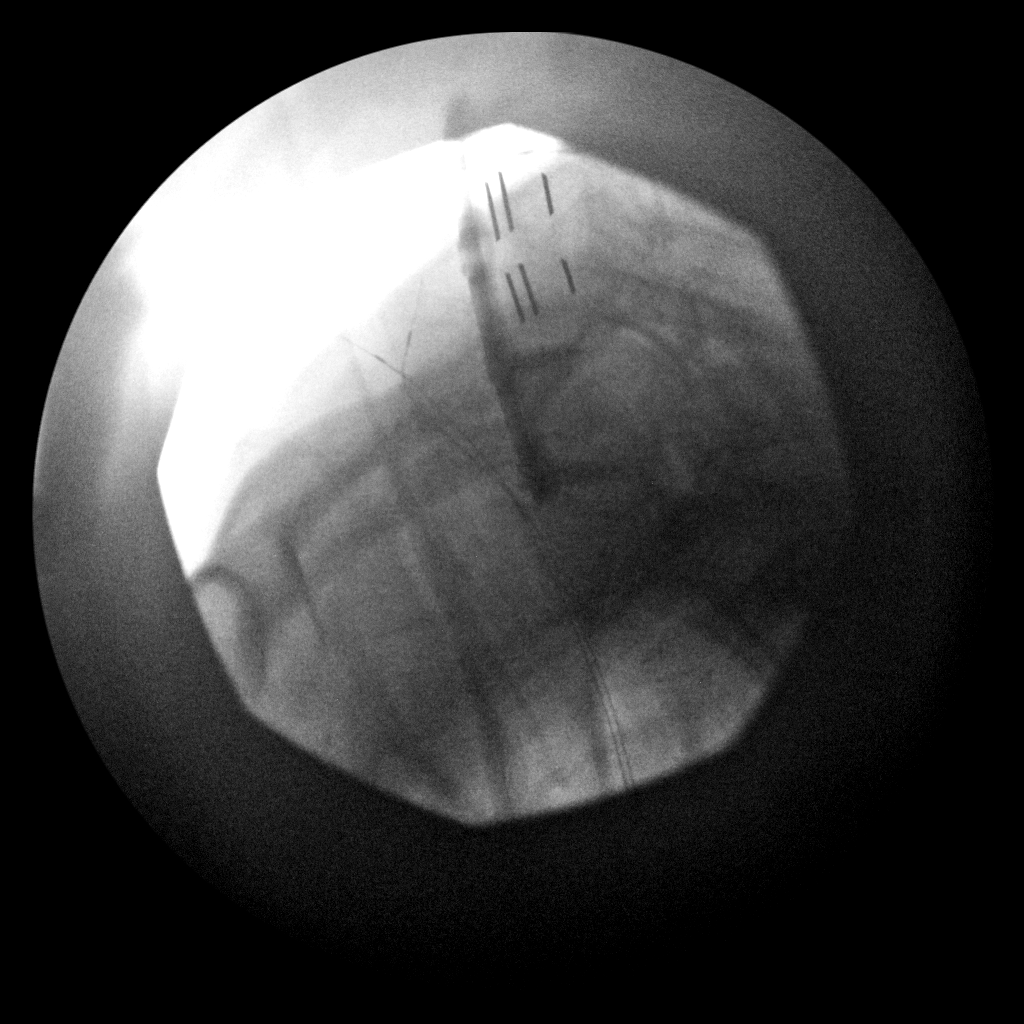
[im 3/3]
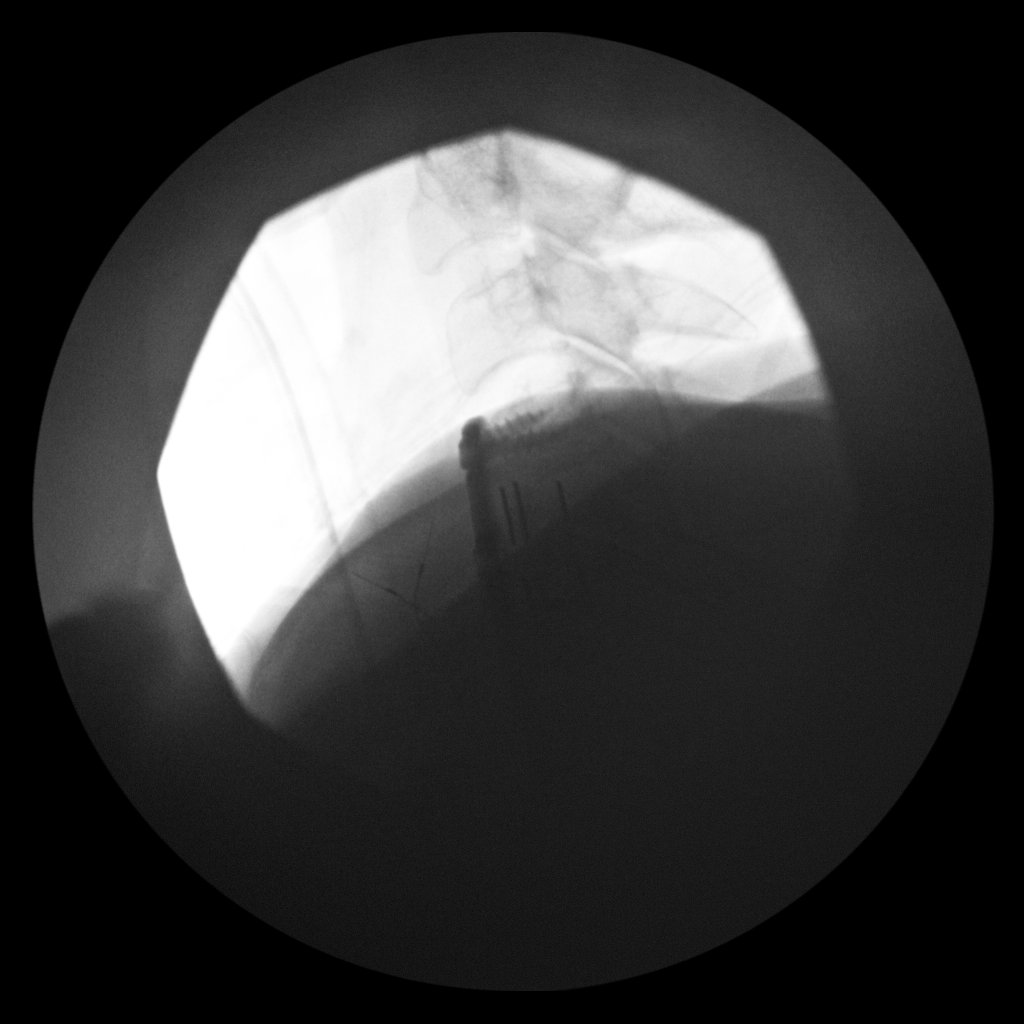

[3 of 3 positions shown; findings below may reference images not displayed]

FINDINGS: Three intraoperative fluoroscopic images were obtained of the
cervical spine. These images demonstrate the patient be status post
C5 corpectomy with cervical anterior fusion extending from C4 to C7.
Good alignment of vertebral bodies is noted.
IMPRESSION: Postsurgical changes as described above.

## 2019-02-10 DIAGNOSIS — M542 Cervicalgia: Secondary | ICD-10-CM | POA: Diagnosis not present

## 2019-02-10 DIAGNOSIS — R5383 Other fatigue: Secondary | ICD-10-CM | POA: Diagnosis not present

## 2019-02-10 DIAGNOSIS — M150 Primary generalized (osteo)arthritis: Secondary | ICD-10-CM | POA: Diagnosis not present

## 2019-02-10 DIAGNOSIS — M0609 Rheumatoid arthritis without rheumatoid factor, multiple sites: Secondary | ICD-10-CM | POA: Diagnosis not present

## 2019-02-10 DIAGNOSIS — R768 Other specified abnormal immunological findings in serum: Secondary | ICD-10-CM | POA: Diagnosis not present

## 2019-04-20 DIAGNOSIS — Z23 Encounter for immunization: Secondary | ICD-10-CM | POA: Diagnosis not present

## 2019-04-28 ENCOUNTER — Other Ambulatory Visit: Payer: Self-pay

## 2019-04-28 DIAGNOSIS — Z20822 Contact with and (suspected) exposure to covid-19: Secondary | ICD-10-CM

## 2019-04-28 DIAGNOSIS — J209 Acute bronchitis, unspecified: Secondary | ICD-10-CM | POA: Diagnosis not present

## 2019-04-29 LAB — NOVEL CORONAVIRUS, NAA: SARS-CoV-2, NAA: DETECTED — AB

## 2019-04-30 ENCOUNTER — Ambulatory Visit: Payer: Self-pay

## 2019-04-30 NOTE — Telephone Encounter (Signed)
Returned call to pt. To discuss positive COVID test results.  See result note.   Message from Jodie Echevaria sent at 04/30/2019 9:06 AM EST  Patient called back for her covid test result. Please call her at ph# [phone]

## 2019-05-12 ENCOUNTER — Other Ambulatory Visit: Payer: Self-pay

## 2019-05-12 DIAGNOSIS — Z79899 Other long term (current) drug therapy: Secondary | ICD-10-CM | POA: Diagnosis not present

## 2019-05-12 DIAGNOSIS — E118 Type 2 diabetes mellitus with unspecified complications: Secondary | ICD-10-CM | POA: Diagnosis not present

## 2019-05-12 DIAGNOSIS — E039 Hypothyroidism, unspecified: Secondary | ICD-10-CM | POA: Diagnosis not present

## 2019-05-12 DIAGNOSIS — I10 Essential (primary) hypertension: Secondary | ICD-10-CM | POA: Diagnosis not present

## 2019-05-12 DIAGNOSIS — Z20822 Contact with and (suspected) exposure to covid-19: Secondary | ICD-10-CM

## 2019-05-13 DIAGNOSIS — R609 Edema, unspecified: Secondary | ICD-10-CM | POA: Diagnosis not present

## 2019-05-13 DIAGNOSIS — E785 Hyperlipidemia, unspecified: Secondary | ICD-10-CM | POA: Diagnosis not present

## 2019-05-13 DIAGNOSIS — E1169 Type 2 diabetes mellitus with other specified complication: Secondary | ICD-10-CM | POA: Diagnosis not present

## 2019-05-14 LAB — NOVEL CORONAVIRUS, NAA: SARS-CoV-2, NAA: NOT DETECTED

## 2019-05-17 DIAGNOSIS — Z23 Encounter for immunization: Secondary | ICD-10-CM | POA: Diagnosis not present

## 2019-05-21 ENCOUNTER — Ambulatory Visit: Payer: Self-pay

## 2019-05-21 NOTE — Telephone Encounter (Signed)
This encounter was created in error - please disregard.

## 2019-05-21 NOTE — Telephone Encounter (Signed)
Provided  covid-19 results  To Patient voiced understanding.

## 2019-06-17 ENCOUNTER — Encounter (HOSPITAL_COMMUNITY): Payer: Self-pay | Admitting: *Deleted

## 2019-06-17 ENCOUNTER — Emergency Department (HOSPITAL_COMMUNITY): Payer: Medicare Other

## 2019-06-17 ENCOUNTER — Ambulatory Visit
Admission: EM | Admit: 2019-06-17 | Discharge: 2019-06-17 | Disposition: A | Discharge status: Home / Self Care | Payer: Medicare Other | Source: Home / Self Care

## 2019-06-17 ENCOUNTER — Other Ambulatory Visit: Payer: Self-pay

## 2019-06-17 ENCOUNTER — Emergency Department (HOSPITAL_COMMUNITY)
Admission: EM | Admit: 2019-06-17 | Discharge: 2019-06-17 | Disposition: A | Discharge status: Home / Self Care | Payer: Medicare Other | Attending: Emergency Medicine | Admitting: Emergency Medicine

## 2019-06-17 DIAGNOSIS — R112 Nausea with vomiting, unspecified: Secondary | ICD-10-CM

## 2019-06-17 DIAGNOSIS — E119 Type 2 diabetes mellitus without complications: Secondary | ICD-10-CM | POA: Insufficient documentation

## 2019-06-17 DIAGNOSIS — R1110 Vomiting, unspecified: Secondary | ICD-10-CM | POA: Diagnosis not present

## 2019-06-17 DIAGNOSIS — E039 Hypothyroidism, unspecified: Secondary | ICD-10-CM | POA: Diagnosis not present

## 2019-06-17 DIAGNOSIS — Z20828 Contact with and (suspected) exposure to other viral communicable diseases: Secondary | ICD-10-CM | POA: Insufficient documentation

## 2019-06-17 DIAGNOSIS — R1084 Generalized abdominal pain: Secondary | ICD-10-CM | POA: Diagnosis not present

## 2019-06-17 DIAGNOSIS — I10 Essential (primary) hypertension: Secondary | ICD-10-CM | POA: Insufficient documentation

## 2019-06-17 DIAGNOSIS — R109 Unspecified abdominal pain: Secondary | ICD-10-CM | POA: Diagnosis present

## 2019-06-17 LAB — COMPREHENSIVE METABOLIC PANEL
ALT: 26 U/L (ref 0–44)
AST: 24 U/L (ref 15–41)
Albumin: 4.7 g/dL (ref 3.5–5.0)
Alkaline Phosphatase: 82 U/L (ref 38–126)
Anion gap: 10 (ref 5–15)
BUN: 20 mg/dL (ref 8–23)
CO2: 28 mmol/L (ref 22–32)
Calcium: 8.9 mg/dL (ref 8.9–10.3)
Chloride: 101 mmol/L (ref 98–111)
Creatinine, Ser: 0.7 mg/dL (ref 0.44–1.00)
GFR calc Af Amer: >60 mL/min (ref >60)
GFR calc non Af Amer: >60 mL/min (ref >60)
Glucose, Bld: 135 mg/dL — ABNORMAL HIGH (ref 70–99)
Potassium: 3.6 mmol/L (ref 3.5–5.1)
Sodium: 139 mmol/L (ref 135–145)
Total Bilirubin: 0.9 mg/dL (ref 0.3–1.2)
Total Protein: 7.5 g/dL (ref 6.5–8.1)

## 2019-06-17 LAB — CBC
HCT: 44.3 % (ref 36.0–46.0)
Hemoglobin: 14.6 g/dL (ref 12.0–15.0)
MCH: 31.9 pg (ref 26.0–34.0)
MCHC: 33 g/dL (ref 30.0–36.0)
MCV: 96.7 fL (ref 80.0–100.0)
Platelets: 198 10*3/uL (ref 150–400)
RBC: 4.58 MIL/uL (ref 3.87–5.11)
RDW: 12.4 % (ref 11.5–15.5)
WBC: 20.1 10*3/uL — ABNORMAL HIGH (ref 4.0–10.5)
nRBC: 0 % (ref 0.0–0.2)

## 2019-06-17 LAB — LACTIC ACID, PLASMA: Lactic Acid, Venous: 2.9 mmol/L (ref 0.5–1.9)

## 2019-06-17 LAB — LIPASE, BLOOD: Lipase: 21 U/L (ref 11–51)

## 2019-06-17 MED ORDER — IOHEXOL 300 MG/ML  SOLN
100.0000 mL | Freq: Once | INTRAMUSCULAR | Status: AC | PRN
  Administered 2019-06-17: 100 mL via INTRAVENOUS

## 2019-06-17 MED ORDER — ONDANSETRON HCL 4 MG/2ML IJ SOLN
4.0000 mg | Freq: Once | INTRAMUSCULAR | Status: AC
  Administered 2019-06-17: 4 mg via INTRAVENOUS
  Filled 2019-06-17: qty 2

## 2019-06-17 MED ORDER — SODIUM CHLORIDE 0.9 % IV BOLUS
1000.0000 mL | Freq: Once | INTRAVENOUS | Status: AC
  Administered 2019-06-17: 1000 mL via INTRAVENOUS

## 2019-06-17 MED ORDER — FENTANYL CITRATE (PF) 100 MCG/2ML IJ SOLN
50.0000 ug | Freq: Once | INTRAMUSCULAR | Status: AC
  Administered 2019-06-17: 50 ug via INTRAVENOUS
  Filled 2019-06-17: qty 2

## 2019-06-17 MED ORDER — ONDANSETRON 4 MG PO TBDP: 4.0000 mg | ORAL_TABLET | Freq: Three times a day (TID) | ORAL | 0 refills | Status: DC | PRN

## 2019-06-17 NOTE — ED Triage Notes (Signed)
Abdominal pain with nausea and vomiting onset today

## 2019-06-17 NOTE — ED Provider Notes (Signed)
[facility] Emergency Department Provider Note MRN:  [id number]  Arrival date & time: 06/17/19     Chief Complaint   Abdominal Pain   History of Present Illness   Monique Weiss is a 63 y.o. year-old female with a history of diabetes, hypertension, hyperlipidemia presenting to the ED with chief complaint of abdominal pain.  Worsening diffuse abdominal pain today associated with nausea, vomiting.  No bowel movements today, not passing gas for the past several hours.  Denies fever, no chest pain or shortness of breath.  Symptoms are moderate to severe, constant, no exacerbating or relieving factors.  Review of Systems  A complete 10 system review of systems was obtained and all systems are negative except as noted in the HPI and PMH.   Patient's Health History    Past Medical History:  Diagnosis Date  . Anxiety   . Arthritis   . Complication of anesthesia    Hard to wake up  . Diabetes mellitus without complication ([place])    Type II  . Hyperlipidemia   . Hypertension   . Hypothyroidism   . PONV (postoperative nausea and vomiting)   . Thyroid disease    Hypo    Past Surgical History:  Procedure Laterality Date  . ANTERIOR CERVICAL CORPECTOMY N/A 07/27/2018   Procedure: Cervical five Anterior cervical corpectomy with Cervical six-seven Anterior cervical discectomy and fusion;  Surgeon: Judith Part, MD;  Location: [place];  Service: Neurosurgery;  Laterality: N/A;  . BACK SURGERY      fusions x3  . BELPHAROPTOSIS REPAIR     Bilateral  . CHOLECYSTECTOMY N/A 01/14/2018   Procedure: LAPAROSCOPIC CHOLECYSTECTOMY;  Surgeon: Virl Cagey, MD;  Location: [facility];  Service: General;  Laterality: N/A;  . DILATION AND CURETTAGE OF UTERUS    . EYE SURGERY    . JOINT REPLACEMENT    . REPLACEMENT TOTAL KNEE    . SHOULDER SURGERY      Family History  Problem Relation Age of Onset  . Heart disease Mother   . Diabetes Mother   . Hypertension Mother     . Heart failure Mother   . Heart attack Father   . Prostate cancer Father   . Heart disease Sister   . Hypertension Sister   . Diabetes Sister   . Heart attack Brother   . Heart disease Brother   . Hypertension Brother   . Diabetes Brother     Social History   Socioeconomic History  . Marital status: Divorced    Spouse name: Not on file  . Number of children: 2  . Years of education: 36  . Highest education level: 12th grade  Occupational History  . Occupation: not working  . Occupation: former caregiver  Tobacco Use  . Smoking status: Never Smoker  . Smokeless tobacco: Never Used  Substance and Sexual Activity  . Alcohol use: Never  . Drug use: Never  . Sexual activity: Not on file  Other Topics Concern  . Not on file  Social History Narrative   She lives with her son and daughter-in-law.     Highest level of education:  12th grade   She worked as a Building control surveyor.  Not currently working.     Social Determinants of Health   Financial Resource Strain:   . Difficulty of Paying Living Expenses: Not on file  Food Insecurity:   . Worried About Charity fundraiser in the Last Year: Not on file  .  Ran Out of Food in the Last Year: Not on file  Transportation Needs:   . Lack of Transportation (Medical): Not on file  . Lack of Transportation (Non-Medical): Not on file  Physical Activity:   . Days of Exercise per Week: Not on file  . Minutes of Exercise per Session: Not on file  Stress:   . Feeling of Stress : Not on file  Social Connections:   . Frequency of Communication with Friends and Family: Not on file  . Frequency of Social Gatherings with Friends and Family: Not on file  . Attends Religious Services: Not on file  . Active Member of Clubs or Organizations: Not on file  . Attends Archivist Meetings: Not on file  . Marital Status: Not on file  Intimate Partner Violence:   . Fear of Current or Ex-Partner: Not on file  . Emotionally Abused: Not on file   . Physically Abused: Not on file  . Sexually Abused: Not on file     Physical Exam  Vital Signs and Nursing Notes reviewed Vitals:   06/17/19 1550  BP: 118/70  Pulse: (!) 103  Resp: 20  Temp: 99 F (37.2 C)  SpO2: 99%    CONSTITUTIONAL: Well-appearing, NAD NEURO:  Alert and oriented x 3, no focal deficits EYES:  eyes equal and reactive ENT/NECK:  no LAD, no JVD CARDIO: Regular rate, well-perfused, normal S1 and S2 PULM:  CTAB no wheezing or rhonchi GI/GU:  normal bowel sounds, moderate distention and tenderness palpation diffusely MSK/SPINE:  No gross deformities, no edema SKIN:  no rash, atraumatic PSYCH:  Appropriate speech and behavior  Diagnostic and Interventional Summary    EKG Interpretation  Date/Time:  Thursday June 17 2019 18:01:59 EST Ventricular Rate:  90 PR Interval:  178 QRS Duration: 98 QT Interval:  382 QTC Calculation: 467 R Axis:   -24 Text Interpretation: Normal sinus rhythm Incomplete right bundle branch block Borderline ECG Confirmed by Gerlene Fee [phone]) on 06/17/2019 6:15:37 PM      Labs Reviewed  CBC - Abnormal; Notable for the following components:      Result Value   WBC 20.1 (*)    All other components within normal limits  COMPREHENSIVE METABOLIC PANEL - Abnormal; Notable for the following components:   Glucose, Bld 135 (*)    All other components within normal limits  LACTIC ACID, PLASMA - Abnormal; Notable for the following components:   Lactic Acid, Venous 2.9 (*)    All other components within normal limits  SARS CORONAVIRUS 2 (TAT 6-24 HRS)  LIPASE, BLOOD    CT Abdomen Pelvis W Contrast  Final Result      Medications  sodium chloride 0.9 % bolus 1,000 mL (1,000 mLs Intravenous New Bag/Given 06/17/19 1715)  fentaNYL (SUBLIMAZE) injection 50 mcg (50 mcg Intravenous Given 06/17/19 1719)  ondansetron (ZOFRAN) injection 4 mg (4 mg Intravenous Given 06/17/19 1719)  iohexol (OMNIPAQUE) 300 MG/ML solution 100 mL (100 mLs  Intravenous Contrast Given 06/17/19 1839)     Procedures  /  Critical Care Procedures  ED Course and Medical Decision Making  I have reviewed the triage vital signs and the nursing notes.  Pertinent labs & imaging results that were available during my care of the patient were reviewed by me and considered in my medical decision making (see below for details).     Considering SBO given patient's vomiting, distention, lack of flatulence.  History of cholecystectomy.  Will need CT to exclude.  Work-up  reveals marked leukocytosis.  Labs are otherwise reassuring, CT is without acute process.  Suspect gastritis, likely viral, possibly COVID-19 though less likely given that patient had COVID-19 only a month ago.  Patient is feeling much better, normal vital signs, tolerating p.o., appropriate for discharge.  Barth Kirks. Sedonia Small, [place] [email or web address]  Final Clinical Impressions(s) / ED Diagnoses     ICD-10-CM   1. Generalized abdominal pain  R10.84   2. Non-intractable vomiting with nausea, unspecified vomiting type  R11.2     ED Discharge Orders         Ordered    ondansetron (ZOFRAN ODT) 4 MG disintegrating tablet  Every 8 hours PRN     06/17/19 1916           Discharge Instructions Discussed with and Provided to Patient:     Discharge Instructions     You were evaluated in the Emergency Department and after careful evaluation, we did not find any emergent condition requiring admission or further testing in the hospital.  Your exam/testing today is overall reassuring.  You can use the Zofran nausea medication as needed at home.  Please continue to drink plenty of fluids to avoid dehydration.  We tested you for the coronavirus, please continue home quarantine until you receive a negative result.  Please return to the Emergency Department if you experience any worsening of your condition.  We encourage you to follow up  with a primary care provider.  Thank you for allowing [place] to be a part of your care.       Maudie Flakes, MD 06/17/19 [phone]

## 2019-06-17 NOTE — Discharge Instructions (Addendum)
You were evaluated in the Emergency Department and after careful evaluation, we did not find any emergent condition requiring admission or further testing in the hospital.  Your exam/testing today is overall reassuring.  You can use the Zofran nausea medication as needed at home.  Please continue to drink plenty of fluids to avoid dehydration.  We tested you for the coronavirus, please continue home quarantine until you receive a negative result.  Please return to the Emergency Department if you experience any worsening of your condition.  We encourage you to follow up with a primary care provider.  Thank you for allowing [place] to be a part of your care.

## 2019-06-17 NOTE — ED Triage Notes (Signed)
Pt presents with abdominal pain and vomiting,abdomen is hard and distended, pt hasnt had BM today but had BM yesterday. Pt has 10/10. Pt will require higher level of care

## 2019-06-18 LAB — SARS CORONAVIRUS 2 (TAT 6-24 HRS): SARS Coronavirus 2: NEGATIVE

## 2019-07-28 DIAGNOSIS — M150 Primary generalized (osteo)arthritis: Secondary | ICD-10-CM | POA: Diagnosis not present

## 2019-07-28 DIAGNOSIS — M0609 Rheumatoid arthritis without rheumatoid factor, multiple sites: Secondary | ICD-10-CM | POA: Diagnosis not present

## 2019-07-28 DIAGNOSIS — R5383 Other fatigue: Secondary | ICD-10-CM | POA: Diagnosis not present

## 2019-08-18 DIAGNOSIS — E785 Hyperlipidemia, unspecified: Secondary | ICD-10-CM | POA: Diagnosis not present

## 2019-08-18 DIAGNOSIS — E039 Hypothyroidism, unspecified: Secondary | ICD-10-CM | POA: Diagnosis not present

## 2019-08-18 DIAGNOSIS — R601 Generalized edema: Secondary | ICD-10-CM | POA: Diagnosis not present

## 2019-08-18 DIAGNOSIS — E118 Type 2 diabetes mellitus with unspecified complications: Secondary | ICD-10-CM | POA: Diagnosis not present

## 2019-08-18 DIAGNOSIS — Z79899 Other long term (current) drug therapy: Secondary | ICD-10-CM | POA: Diagnosis not present

## 2019-08-24 ENCOUNTER — Other Ambulatory Visit (HOSPITAL_COMMUNITY): Payer: Self-pay | Admitting: Internal Medicine

## 2019-08-24 DIAGNOSIS — Z1231 Encounter for screening mammogram for malignant neoplasm of breast: Secondary | ICD-10-CM

## 2019-08-24 DIAGNOSIS — E785 Hyperlipidemia, unspecified: Secondary | ICD-10-CM | POA: Diagnosis not present

## 2019-08-24 DIAGNOSIS — E039 Hypothyroidism, unspecified: Secondary | ICD-10-CM | POA: Diagnosis not present

## 2019-08-24 DIAGNOSIS — R609 Edema, unspecified: Secondary | ICD-10-CM | POA: Diagnosis not present

## 2019-08-24 DIAGNOSIS — E1169 Type 2 diabetes mellitus with other specified complication: Secondary | ICD-10-CM | POA: Diagnosis not present

## 2019-08-26 ENCOUNTER — Other Ambulatory Visit: Payer: Self-pay | Admitting: Physician Assistant

## 2019-08-26 ENCOUNTER — Other Ambulatory Visit (HOSPITAL_COMMUNITY): Payer: Self-pay | Admitting: Physician Assistant

## 2019-08-26 DIAGNOSIS — M7989 Other specified soft tissue disorders: Secondary | ICD-10-CM

## 2019-08-27 ENCOUNTER — Ambulatory Visit (HOSPITAL_COMMUNITY): Payer: Medicare Other

## 2019-09-01 ENCOUNTER — Ambulatory Visit (HOSPITAL_COMMUNITY)
Admission: RE | Admit: 2019-09-01 | Discharge: 2019-09-01 | Disposition: A | Discharge status: Home / Self Care | Payer: Medicare Other | Source: Ambulatory Visit | Attending: Physician Assistant | Admitting: Physician Assistant

## 2019-09-01 ENCOUNTER — Ambulatory Visit (HOSPITAL_COMMUNITY)
Admission: RE | Admit: 2019-09-01 | Discharge: 2019-09-01 | Disposition: A | Discharge status: Home / Self Care | Payer: Medicare Other | Source: Ambulatory Visit | Attending: Internal Medicine | Admitting: Internal Medicine

## 2019-09-01 ENCOUNTER — Other Ambulatory Visit: Payer: Self-pay

## 2019-09-01 DIAGNOSIS — Z1231 Encounter for screening mammogram for malignant neoplasm of breast: Secondary | ICD-10-CM | POA: Diagnosis not present

## 2019-09-01 DIAGNOSIS — M7989 Other specified soft tissue disorders: Secondary | ICD-10-CM | POA: Insufficient documentation

## 2019-09-01 DIAGNOSIS — M79642 Pain in left hand: Secondary | ICD-10-CM | POA: Diagnosis not present

## 2019-10-05 DIAGNOSIS — D225 Melanocytic nevi of trunk: Secondary | ICD-10-CM | POA: Diagnosis not present

## 2019-10-05 DIAGNOSIS — L820 Inflamed seborrheic keratosis: Secondary | ICD-10-CM | POA: Diagnosis not present

## 2019-10-26 DIAGNOSIS — R768 Other specified abnormal immunological findings in serum: Secondary | ICD-10-CM | POA: Diagnosis not present

## 2019-10-26 DIAGNOSIS — M542 Cervicalgia: Secondary | ICD-10-CM | POA: Diagnosis not present

## 2019-10-26 DIAGNOSIS — M0609 Rheumatoid arthritis without rheumatoid factor, multiple sites: Secondary | ICD-10-CM | POA: Diagnosis not present

## 2019-10-26 DIAGNOSIS — R5383 Other fatigue: Secondary | ICD-10-CM | POA: Diagnosis not present

## 2019-10-26 DIAGNOSIS — M150 Primary generalized (osteo)arthritis: Secondary | ICD-10-CM | POA: Diagnosis not present

## 2019-11-05 DIAGNOSIS — M25512 Pain in left shoulder: Secondary | ICD-10-CM | POA: Diagnosis not present

## 2019-11-25 DIAGNOSIS — E118 Type 2 diabetes mellitus with unspecified complications: Secondary | ICD-10-CM | POA: Diagnosis not present

## 2019-11-25 DIAGNOSIS — Z79899 Other long term (current) drug therapy: Secondary | ICD-10-CM | POA: Diagnosis not present

## 2019-11-25 DIAGNOSIS — E039 Hypothyroidism, unspecified: Secondary | ICD-10-CM | POA: Diagnosis not present

## 2019-12-02 DIAGNOSIS — E119 Type 2 diabetes mellitus without complications: Secondary | ICD-10-CM | POA: Diagnosis not present

## 2019-12-02 DIAGNOSIS — G4733 Obstructive sleep apnea (adult) (pediatric): Secondary | ICD-10-CM | POA: Diagnosis not present

## 2019-12-02 DIAGNOSIS — R609 Edema, unspecified: Secondary | ICD-10-CM | POA: Diagnosis not present

## 2019-12-06 DIAGNOSIS — R200 Anesthesia of skin: Secondary | ICD-10-CM | POA: Diagnosis not present

## 2019-12-07 DIAGNOSIS — R200 Anesthesia of skin: Secondary | ICD-10-CM | POA: Diagnosis not present

## 2019-12-08 DIAGNOSIS — G5602 Carpal tunnel syndrome, left upper limb: Secondary | ICD-10-CM | POA: Diagnosis not present

## 2019-12-22 DIAGNOSIS — M25512 Pain in left shoulder: Secondary | ICD-10-CM | POA: Diagnosis not present

## 2020-01-12 DIAGNOSIS — E119 Type 2 diabetes mellitus without complications: Secondary | ICD-10-CM | POA: Diagnosis not present

## 2020-01-20 DIAGNOSIS — G5602 Carpal tunnel syndrome, left upper limb: Secondary | ICD-10-CM | POA: Diagnosis not present

## 2020-01-26 DIAGNOSIS — M150 Primary generalized (osteo)arthritis: Secondary | ICD-10-CM | POA: Diagnosis not present

## 2020-01-26 DIAGNOSIS — M0609 Rheumatoid arthritis without rheumatoid factor, multiple sites: Secondary | ICD-10-CM | POA: Diagnosis not present

## 2020-01-26 DIAGNOSIS — R5383 Other fatigue: Secondary | ICD-10-CM | POA: Diagnosis not present

## 2020-01-26 DIAGNOSIS — R768 Other specified abnormal immunological findings in serum: Secondary | ICD-10-CM | POA: Diagnosis not present

## 2020-01-26 DIAGNOSIS — M542 Cervicalgia: Secondary | ICD-10-CM | POA: Diagnosis not present

## 2020-02-04 DIAGNOSIS — G5602 Carpal tunnel syndrome, left upper limb: Secondary | ICD-10-CM | POA: Diagnosis not present

## 2020-02-21 DIAGNOSIS — M25512 Pain in left shoulder: Secondary | ICD-10-CM | POA: Diagnosis not present

## 2020-02-29 DIAGNOSIS — I10 Essential (primary) hypertension: Secondary | ICD-10-CM | POA: Diagnosis not present

## 2020-02-29 DIAGNOSIS — Z79899 Other long term (current) drug therapy: Secondary | ICD-10-CM | POA: Diagnosis not present

## 2020-02-29 DIAGNOSIS — E1159 Type 2 diabetes mellitus with other circulatory complications: Secondary | ICD-10-CM | POA: Diagnosis not present

## 2020-02-29 DIAGNOSIS — M069 Rheumatoid arthritis, unspecified: Secondary | ICD-10-CM | POA: Diagnosis not present

## 2020-03-03 DIAGNOSIS — N390 Urinary tract infection, site not specified: Secondary | ICD-10-CM | POA: Diagnosis not present

## 2020-03-03 DIAGNOSIS — I10 Essential (primary) hypertension: Secondary | ICD-10-CM | POA: Diagnosis not present

## 2020-03-03 DIAGNOSIS — R7309 Other abnormal glucose: Secondary | ICD-10-CM | POA: Diagnosis not present

## 2020-03-03 DIAGNOSIS — G4733 Obstructive sleep apnea (adult) (pediatric): Secondary | ICD-10-CM | POA: Diagnosis not present

## 2020-03-03 DIAGNOSIS — E1100 Type 2 diabetes mellitus with hyperosmolarity without nonketotic hyperglycemic-hyperosmolar coma (NKHHC): Secondary | ICD-10-CM | POA: Diagnosis not present

## 2020-03-03 DIAGNOSIS — N3000 Acute cystitis without hematuria: Secondary | ICD-10-CM | POA: Diagnosis not present

## 2020-03-28 ENCOUNTER — Encounter: Payer: Medicare Other | Admitting: Adult Health

## 2020-04-06 ENCOUNTER — Encounter: Payer: Self-pay | Admitting: Adult Health

## 2020-04-06 ENCOUNTER — Ambulatory Visit (INDEPENDENT_AMBULATORY_CARE_PROVIDER_SITE_OTHER): Payer: Medicare Other | Admitting: Adult Health

## 2020-04-06 ENCOUNTER — Other Ambulatory Visit (HOSPITAL_COMMUNITY)
Admission: RE | Admit: 2020-04-06 | Discharge: 2020-04-06 | Disposition: A | Discharge status: Home / Self Care | Payer: Medicare Other | Source: Ambulatory Visit | Attending: Adult Health | Admitting: Adult Health

## 2020-04-06 ENCOUNTER — Other Ambulatory Visit: Payer: Self-pay | Admitting: Adult Health

## 2020-04-06 VITALS — BP 116/72 | HR 77 | Ht 68.0 in | Wt 211.0 lb

## 2020-04-06 DIAGNOSIS — N390 Urinary tract infection, site not specified: Secondary | ICD-10-CM

## 2020-04-06 DIAGNOSIS — R319 Hematuria, unspecified: Secondary | ICD-10-CM | POA: Diagnosis not present

## 2020-04-06 DIAGNOSIS — L900 Lichen sclerosus et atrophicus: Secondary | ICD-10-CM | POA: Insufficient documentation

## 2020-04-06 DIAGNOSIS — Z1151 Encounter for screening for human papillomavirus (HPV): Secondary | ICD-10-CM | POA: Insufficient documentation

## 2020-04-06 DIAGNOSIS — N841 Polyp of cervix uteri: Secondary | ICD-10-CM

## 2020-04-06 DIAGNOSIS — Z01419 Encounter for gynecological examination (general) (routine) without abnormal findings: Secondary | ICD-10-CM | POA: Diagnosis present

## 2020-04-06 DIAGNOSIS — N3946 Mixed incontinence: Secondary | ICD-10-CM

## 2020-04-06 DIAGNOSIS — N814 Uterovaginal prolapse, unspecified: Secondary | ICD-10-CM

## 2020-04-06 DIAGNOSIS — R300 Dysuria: Secondary | ICD-10-CM

## 2020-04-06 DIAGNOSIS — N898 Other specified noninflammatory disorders of vagina: Secondary | ICD-10-CM | POA: Diagnosis not present

## 2020-04-06 LAB — POCT URINALYSIS DIPSTICK OB
Glucose, UA: NEGATIVE
Ketones, UA: NEGATIVE
Nitrite, UA: POSITIVE
POC,PROTEIN,UA: NEGATIVE

## 2020-04-06 MED ORDER — CLOBETASOL PROPIONATE 0.05 % EX CREA: 1.0000 "application " | TOPICAL_CREAM | Freq: Two times a day (BID) | CUTANEOUS | 2 refills | Status: DC

## 2020-04-06 MED ORDER — CIPROFLOXACIN HCL 500 MG PO TABS: 500.0000 mg | ORAL_TABLET | Freq: Two times a day (BID) | ORAL | 0 refills | Status: DC

## 2020-04-06 NOTE — Patient Instructions (Signed)
Lichen Sclerosus Lichen sclerosus is a skin problem. It can happen on any part of the body, but it commonly involves the anal or genital areas. It can cause itching and discomfort in these areas. Treatment can help to control symptoms. When the genital area is affected, getting treatment is important because the condition can cause scarring that may lead to other problems. What are the causes? The cause of this condition is not known. It may be related to an overactive immune system or a lack of certain hormones. Lichen sclerosus is not an infection or a fungus, and it is not passed from one person to another (not contagious). What increases the risk? This condition is more likely to develop in women, usually after menopause. What are the signs or symptoms? Symptoms of this condition include:  Thin, wrinkled, white areas on the skin.  Thickened white areas on the skin.  Red and swollen patches (lesions) on the skin.  Tears or cracks in the skin.  Bruising.  Blood blisters.  Severe itching.  Pain, itching, or burning when urinating. Constipation is also common in people with lichen sclerosus. How is this diagnosed? This condition may be diagnosed with a physical exam. In some cases, a tissue sample (biopsy sample) may be removed to be looked at under a microscope. How is this treated? This condition is usually treated with medicated creams or ointments (topical steroids) that are applied over the affected areas. In some cases, treatment may also include medicines that are taken by mouth. Surgery may be needed in more severe cases that are causing problems such as scarring. Follow these instructions at home:  Take or use over-the-counter and prescription medicines only as told by your health care provider.  Use creams or ointments as told by your health care provider.  Do not scratch the affected areas of skin.  If you are a woman, be sure to keep the vaginal area as clean and dry  as possible.  Clean the affected area of skin gently with water. Avoid using rough towels or toilet paper.  Keep all follow-up visits as told by your health care provider. This is important. Contact a health care provider if:  You have increasing redness, swelling, or pain in the affected area.  You have fluid, blood, or pus coming from the affected area.  You have new lesions on your skin.  You have a fever.  You have pain during sex. Summary  Lichen sclerosus is a skin problem. When the genital area is affected, getting treatment is important because the condition can cause scarring that may lead to other problems.  This condition is usually treated with medicated creams or ointments (topical steroids) that are applied over the affected areas.  Take or use over-the-counter and prescription medicines only as told by your health care provider.  Contact a health care provider if you have new lesions on your skin, have pain during sex, or have increasing redness, swelling, or pain in the affected area.  Keep all follow-up visits as told by your health care provider. This is important. This information is not intended to replace advice given to you by your health care provider. Make sure you discuss any questions you have with your health care provider. Document Revised: 10/23/2017 Document Reviewed: 10/23/2017 Elsevier Patient Education  2020 Elsevier Inc.  

## 2020-04-06 NOTE — Progress Notes (Signed)
Subjective:     Patient ID: Monique Weiss, female   DOB: 10-Apr-1956, 64 y.o.   MRN: [id number]  HPI Monique Weiss is a 64 year old white female, divorced, PM referred by Dr Willey Blade for itching in vaginal area. She has had frequent UTI and has incontinence. She is not sexually active. She has not had pap smear in about 35 years she says.  PCP is Dr Willey Blade.  Review of Systems Has itching in vaginal area for 8-9 months now Has had UTIs Has urinary incontinence, if coughs or sneezes or if has to go Not sexually active. Reviewed past medical,surgical, social and family history. Reviewed medications and allergies.     Objective:   Physical Exam BP 116/72 (BP Location: Left Arm, Patient Position: Sitting, Cuff Size: Normal)    Pulse 77    Ht 5\' 8"  (1.727 m)    Wt 211 lb (95.7 kg)    BMI 32.08 kg/m urine dipstick showed +nitrates, leuks and blood. Skin warm and dry.Pelvic: external genitalia shows thickening above clitoral area,and tissue is pale, vagina: pale with loss of moisture and rugae,+cystocele with some uterine descensus,urethra has no lesions or masses noted, cervix: bulbous,has 2 cm pendulous  polyp at 10 o'clock vs large nabothian cyst, Pap with high risk HPV genotyping performed, and Dr Elonda Husky in, and he agrees needs removing,  Pt gave verbal consent for removal and a ring forceps were used to twist polyp off and then cervical biopsy was taken at same site, and moncels applied, uterus: felt to be NSSC, non tender, no masses felt, adnexa: no masses or tenderness noted. Bladder is non tender and no masses felt.    AA is 0 Fall risk is low PHQ 9 score is 7, no SI, is on meds   Upstream - 04/06/20 1505      Pregnancy Intention Screening   Does the patient want to become pregnant in the next year? N/A    Does the patient's partner want to become pregnant in the next year? N/A    Would the patient like to discuss contraceptive options today? N/A      Contraception Wrap Up   Current Method No  Method - Other Reason   PM   End Method No Method - Other Reason   PM   Contraception Counseling Provided No          Assessment:     1. Dysuria UA C&S sent  2. Vaginal itching  3. Lichen sclerosus et atrophicus Will rx temovate  Meds ordered this encounter  Medications   clobetasol cream (TEMOVATE) 0.05 %    Sig: Apply 1 application topically 2 (two) times daily.    Dispense:  30 g    Refill:  2    Order Specific Question:   Supervising Provider    Answer:   Tania Ade H [[id number]]   ciprofloxacin (CIPRO) 500 MG tablet    Sig: Take 1 tablet (500 mg total) by mouth 2 (two) times daily.    Dispense:  10 tablet    Refill:  0    Order Specific Question:   Supervising Provider    Answer:   Elonda Husky, LUTHER H [[id number]]    4. Cervical polyp Removed and 2 specimens  sent to pathology   5. Encounter for cervical Pap smear with pelvic exam Pap sent   6. Cystocele with uterine descensus  7. Mixed stress and urge urinary incontinence  8. Urinary tract infection with hematuria, site unspecified UA C&S  sent Will rx cirpo     Plan:     Return in 8 days to discuss pathology and recheck

## 2020-04-07 LAB — URINALYSIS
Bilirubin, UA: NEGATIVE
Glucose, UA: NEGATIVE
Ketones, UA: NEGATIVE
Nitrite, UA: POSITIVE — AB
Protein,UA: NEGATIVE
Specific Gravity, UA: 1.01 (ref 1.005–1.030)
Urobilinogen, Ur: 0.2 mg/dL (ref 0.2–1.0)
pH, UA: 8 — ABNORMAL HIGH (ref 5.0–7.5)

## 2020-04-10 LAB — CYTOLOGY - PAP
Comment: NEGATIVE
Diagnosis: NEGATIVE
High risk HPV: NEGATIVE

## 2020-04-10 LAB — URINE CULTURE

## 2020-04-12 ENCOUNTER — Other Ambulatory Visit (HOSPITAL_BASED_OUTPATIENT_CLINIC_OR_DEPARTMENT_OTHER): Payer: Self-pay

## 2020-04-12 DIAGNOSIS — G4730 Sleep apnea, unspecified: Secondary | ICD-10-CM

## 2020-04-14 ENCOUNTER — Other Ambulatory Visit: Payer: Self-pay

## 2020-04-14 ENCOUNTER — Encounter: Payer: Self-pay | Admitting: Adult Health

## 2020-04-14 ENCOUNTER — Ambulatory Visit (INDEPENDENT_AMBULATORY_CARE_PROVIDER_SITE_OTHER): Payer: Medicare Other | Admitting: Adult Health

## 2020-04-14 VITALS — BP 93/63 | HR 81 | Ht 68.0 in | Wt 213.0 lb

## 2020-04-14 DIAGNOSIS — L900 Lichen sclerosus et atrophicus: Secondary | ICD-10-CM

## 2020-04-14 NOTE — Progress Notes (Signed)
  Subjective:     Patient ID: Monique Weiss, female   DOB: May 06, 1956, 64 y.o.   MRN: [id number]  HPI Monique Weiss is a 64 year old white female,divorced, PM back in follow up on vaginal itching, treated for lichen sclerosus and had cervical polyp removed and cervical biopsy and pap and was treated for UTI and she feels better.  PCP is Dr Willey Blade.   Review of Systems  Itching is better Feels better.Reviewed past medical,surgical, social and family history. Reviewed medications and allergies.     Objective:   Physical Exam BP 93/63 (BP Location: Left Arm, Patient Position: Sitting, Cuff Size: Large)   Pulse 81   Ht 5\' 8"  (1.727 m)   Wt 213 lb (96.6 kg)   BMI 32.39 kg/m  Skin warm and dry.Pelvic: external genitalia, has thinning about clitoral area, vagina:pale, +cystocele,,urethra has no lesions or masses noted, cervix: bulbous, has exudate from monsels, uterus: normal size, shape and contour, non tender, no masses felt, adnexa: no masses or tenderness noted. Bladder is non tender and no masses felt.    Upstream - 04/14/20 1055      Pregnancy Intention Screening   Does the patient want to become pregnant in the next year? N/A    Does the patient's partner want to become pregnant in the next year? N/A    Would the patient like to discuss contraceptive options today? N/A      Contraception Wrap Up   Current Method No Method - Other Reason   postmenopausal   End Method No Method - Other Reason   postmenopausal   Contraception Counseling Provided No         Examination chaperoned by Levy Pupa LPN   Pt aware that urine + E coli and Cipro should have taken care of that, pap was negative for malignancy and HPV and that cervical biopsy and polyp were both benign.  Assessment:     1. Lichen sclerosus et atrophicus Continue temovate 2-3 x weekly    Plan:     Follow up in 6 months or sooner if needed

## 2020-04-24 DIAGNOSIS — Z23 Encounter for immunization: Secondary | ICD-10-CM | POA: Diagnosis not present

## 2020-05-10 DIAGNOSIS — I952 Hypotension due to drugs: Secondary | ICD-10-CM | POA: Diagnosis not present

## 2020-05-10 DIAGNOSIS — R5383 Other fatigue: Secondary | ICD-10-CM | POA: Diagnosis not present

## 2020-05-10 DIAGNOSIS — M542 Cervicalgia: Secondary | ICD-10-CM | POA: Diagnosis not present

## 2020-05-10 DIAGNOSIS — M150 Primary generalized (osteo)arthritis: Secondary | ICD-10-CM | POA: Diagnosis not present

## 2020-05-10 DIAGNOSIS — M0609 Rheumatoid arthritis without rheumatoid factor, multiple sites: Secondary | ICD-10-CM | POA: Diagnosis not present

## 2020-06-02 DIAGNOSIS — M25512 Pain in left shoulder: Secondary | ICD-10-CM | POA: Diagnosis not present

## 2020-06-27 DIAGNOSIS — Z79899 Other long term (current) drug therapy: Secondary | ICD-10-CM | POA: Diagnosis not present

## 2020-06-27 DIAGNOSIS — E039 Hypothyroidism, unspecified: Secondary | ICD-10-CM | POA: Diagnosis not present

## 2020-06-27 DIAGNOSIS — G4733 Obstructive sleep apnea (adult) (pediatric): Secondary | ICD-10-CM | POA: Diagnosis not present

## 2020-06-27 DIAGNOSIS — E1159 Type 2 diabetes mellitus with other circulatory complications: Secondary | ICD-10-CM | POA: Diagnosis not present

## 2020-06-27 DIAGNOSIS — E785 Hyperlipidemia, unspecified: Secondary | ICD-10-CM | POA: Diagnosis not present

## 2020-07-03 DIAGNOSIS — E1169 Type 2 diabetes mellitus with other specified complication: Secondary | ICD-10-CM | POA: Diagnosis not present

## 2020-07-03 DIAGNOSIS — E039 Hypothyroidism, unspecified: Secondary | ICD-10-CM | POA: Diagnosis not present

## 2020-07-03 DIAGNOSIS — E785 Hyperlipidemia, unspecified: Secondary | ICD-10-CM | POA: Diagnosis not present

## 2020-07-03 DIAGNOSIS — R7309 Other abnormal glucose: Secondary | ICD-10-CM | POA: Diagnosis not present

## 2020-08-25 DIAGNOSIS — M25512 Pain in left shoulder: Secondary | ICD-10-CM | POA: Diagnosis not present

## 2020-08-28 ENCOUNTER — Other Ambulatory Visit: Payer: Self-pay | Admitting: Orthopaedic Surgery

## 2020-08-28 DIAGNOSIS — M25512 Pain in left shoulder: Secondary | ICD-10-CM

## 2020-09-07 DIAGNOSIS — R768 Other specified abnormal immunological findings in serum: Secondary | ICD-10-CM | POA: Diagnosis not present

## 2020-09-07 DIAGNOSIS — M542 Cervicalgia: Secondary | ICD-10-CM | POA: Diagnosis not present

## 2020-09-07 DIAGNOSIS — M150 Primary generalized (osteo)arthritis: Secondary | ICD-10-CM | POA: Diagnosis not present

## 2020-09-07 DIAGNOSIS — M0609 Rheumatoid arthritis without rheumatoid factor, multiple sites: Secondary | ICD-10-CM | POA: Diagnosis not present

## 2020-09-07 DIAGNOSIS — G5603 Carpal tunnel syndrome, bilateral upper limbs: Secondary | ICD-10-CM | POA: Diagnosis not present

## 2020-09-07 DIAGNOSIS — R5383 Other fatigue: Secondary | ICD-10-CM | POA: Diagnosis not present

## 2020-09-07 DIAGNOSIS — I952 Hypotension due to drugs: Secondary | ICD-10-CM | POA: Diagnosis not present

## 2020-09-16 ENCOUNTER — Other Ambulatory Visit: Payer: Medicare Other

## 2020-10-05 DIAGNOSIS — M0609 Rheumatoid arthritis without rheumatoid factor, multiple sites: Secondary | ICD-10-CM | POA: Diagnosis not present

## 2020-10-27 DIAGNOSIS — Z7689 Persons encountering health services in other specified circumstances: Secondary | ICD-10-CM | POA: Diagnosis not present

## 2020-11-02 DIAGNOSIS — R202 Paresthesia of skin: Secondary | ICD-10-CM | POA: Diagnosis not present

## 2020-11-02 DIAGNOSIS — R7309 Other abnormal glucose: Secondary | ICD-10-CM | POA: Diagnosis not present

## 2020-11-02 DIAGNOSIS — Z23 Encounter for immunization: Secondary | ICD-10-CM | POA: Diagnosis not present

## 2020-11-02 DIAGNOSIS — R609 Edema, unspecified: Secondary | ICD-10-CM | POA: Diagnosis not present

## 2020-11-02 DIAGNOSIS — E1169 Type 2 diabetes mellitus with other specified complication: Secondary | ICD-10-CM | POA: Diagnosis not present

## 2020-11-03 ENCOUNTER — Other Ambulatory Visit (HOSPITAL_COMMUNITY): Payer: Self-pay | Admitting: Internal Medicine

## 2020-11-03 ENCOUNTER — Other Ambulatory Visit: Payer: Self-pay | Admitting: Internal Medicine

## 2020-11-03 DIAGNOSIS — R202 Paresthesia of skin: Secondary | ICD-10-CM

## 2020-11-06 DIAGNOSIS — L820 Inflamed seborrheic keratosis: Secondary | ICD-10-CM | POA: Diagnosis not present

## 2020-11-16 ENCOUNTER — Ambulatory Visit (HOSPITAL_COMMUNITY)
Admission: RE | Admit: 2020-11-16 | Discharge: 2020-11-16 | Disposition: A | Discharge status: Home / Self Care | Payer: Medicare Other | Source: Ambulatory Visit | Attending: Internal Medicine | Admitting: Internal Medicine

## 2020-11-16 ENCOUNTER — Other Ambulatory Visit: Payer: Self-pay

## 2020-11-16 DIAGNOSIS — R202 Paresthesia of skin: Secondary | ICD-10-CM | POA: Diagnosis not present

## 2020-11-16 DIAGNOSIS — M0609 Rheumatoid arthritis without rheumatoid factor, multiple sites: Secondary | ICD-10-CM | POA: Diagnosis not present

## 2020-11-16 DIAGNOSIS — M545 Low back pain, unspecified: Secondary | ICD-10-CM | POA: Diagnosis not present

## 2020-11-16 DIAGNOSIS — R768 Other specified abnormal immunological findings in serum: Secondary | ICD-10-CM | POA: Diagnosis not present

## 2020-11-16 DIAGNOSIS — M542 Cervicalgia: Secondary | ICD-10-CM | POA: Diagnosis not present

## 2020-11-16 DIAGNOSIS — G5603 Carpal tunnel syndrome, bilateral upper limbs: Secondary | ICD-10-CM | POA: Diagnosis not present

## 2020-11-16 DIAGNOSIS — M150 Primary generalized (osteo)arthritis: Secondary | ICD-10-CM | POA: Diagnosis not present

## 2020-11-16 MED ORDER — GADOBUTROL 1 MMOL/ML IV SOLN
10.0000 mL | Freq: Once | INTRAVENOUS | Status: AC | PRN
  Administered 2020-11-16: 10 mL via INTRAVENOUS

## 2020-12-21 DIAGNOSIS — G629 Polyneuropathy, unspecified: Secondary | ICD-10-CM | POA: Diagnosis not present

## 2020-12-21 DIAGNOSIS — M4802 Spinal stenosis, cervical region: Secondary | ICD-10-CM | POA: Diagnosis not present

## 2021-01-03 DIAGNOSIS — R1084 Generalized abdominal pain: Secondary | ICD-10-CM | POA: Diagnosis not present

## 2021-01-03 DIAGNOSIS — K439 Ventral hernia without obstruction or gangrene: Secondary | ICD-10-CM | POA: Diagnosis not present

## 2021-01-10 DIAGNOSIS — K279 Peptic ulcer, site unspecified, unspecified as acute or chronic, without hemorrhage or perforation: Secondary | ICD-10-CM | POA: Diagnosis not present

## 2021-01-23 DIAGNOSIS — G629 Polyneuropathy, unspecified: Secondary | ICD-10-CM | POA: Diagnosis not present

## 2021-01-25 DIAGNOSIS — R140 Abdominal distension (gaseous): Secondary | ICD-10-CM | POA: Diagnosis not present

## 2021-01-25 DIAGNOSIS — R109 Unspecified abdominal pain: Secondary | ICD-10-CM | POA: Diagnosis not present

## 2021-01-26 ENCOUNTER — Other Ambulatory Visit (HOSPITAL_COMMUNITY): Payer: Self-pay | Admitting: Internal Medicine

## 2021-01-26 ENCOUNTER — Other Ambulatory Visit: Payer: Self-pay | Admitting: Internal Medicine

## 2021-01-26 DIAGNOSIS — R109 Unspecified abdominal pain: Secondary | ICD-10-CM

## 2021-01-26 DIAGNOSIS — R140 Abdominal distension (gaseous): Secondary | ICD-10-CM

## 2021-01-30 ENCOUNTER — Other Ambulatory Visit: Payer: Self-pay

## 2021-01-30 ENCOUNTER — Ambulatory Visit (HOSPITAL_COMMUNITY)
Admission: RE | Admit: 2021-01-30 | Discharge: 2021-01-30 | Disposition: A | Discharge status: Home / Self Care | Payer: Medicare Other | Source: Ambulatory Visit | Attending: Internal Medicine | Admitting: Internal Medicine

## 2021-01-30 DIAGNOSIS — R140 Abdominal distension (gaseous): Secondary | ICD-10-CM | POA: Diagnosis not present

## 2021-01-30 DIAGNOSIS — K529 Noninfective gastroenteritis and colitis, unspecified: Secondary | ICD-10-CM | POA: Diagnosis not present

## 2021-01-30 DIAGNOSIS — R109 Unspecified abdominal pain: Secondary | ICD-10-CM | POA: Diagnosis not present

## 2021-02-07 ENCOUNTER — Encounter (HOSPITAL_COMMUNITY): Payer: Self-pay | Admitting: Emergency Medicine

## 2021-02-07 ENCOUNTER — Telehealth: Payer: Self-pay | Admitting: Internal Medicine

## 2021-02-07 ENCOUNTER — Inpatient Hospital Stay (HOSPITAL_COMMUNITY)
Admission: EM | Admit: 2021-02-07 | Discharge: 2021-02-10 | DRG: 358 | Disposition: A | Discharge status: Home / Self Care | Payer: Medicare Other | Attending: Family Medicine | Admitting: Family Medicine

## 2021-02-07 ENCOUNTER — Emergency Department (HOSPITAL_COMMUNITY): Payer: Medicare Other

## 2021-02-07 ENCOUNTER — Other Ambulatory Visit: Payer: Self-pay

## 2021-02-07 DIAGNOSIS — K529 Noninfective gastroenteritis and colitis, unspecified: Principal | ICD-10-CM

## 2021-02-07 DIAGNOSIS — R197 Diarrhea, unspecified: Secondary | ICD-10-CM

## 2021-02-07 DIAGNOSIS — Z833 Family history of diabetes mellitus: Secondary | ICD-10-CM

## 2021-02-07 DIAGNOSIS — Z79899 Other long term (current) drug therapy: Secondary | ICD-10-CM | POA: Diagnosis not present

## 2021-02-07 DIAGNOSIS — Z96659 Presence of unspecified artificial knee joint: Secondary | ICD-10-CM | POA: Diagnosis present

## 2021-02-07 DIAGNOSIS — Z6833 Body mass index (BMI) 33.0-33.9, adult: Secondary | ICD-10-CM

## 2021-02-07 DIAGNOSIS — K219 Gastro-esophageal reflux disease without esophagitis: Secondary | ICD-10-CM | POA: Diagnosis not present

## 2021-02-07 DIAGNOSIS — F32A Depression, unspecified: Secondary | ICD-10-CM | POA: Diagnosis present

## 2021-02-07 DIAGNOSIS — I4510 Unspecified right bundle-branch block: Secondary | ICD-10-CM | POA: Diagnosis present

## 2021-02-07 DIAGNOSIS — K631 Perforation of intestine (nontraumatic): Secondary | ICD-10-CM | POA: Diagnosis not present

## 2021-02-07 DIAGNOSIS — K5000 Crohn's disease of small intestine without complications: Secondary | ICD-10-CM | POA: Diagnosis not present

## 2021-02-07 DIAGNOSIS — R1013 Epigastric pain: Secondary | ICD-10-CM | POA: Diagnosis not present

## 2021-02-07 DIAGNOSIS — R109 Unspecified abdominal pain: Secondary | ICD-10-CM

## 2021-02-07 DIAGNOSIS — Z981 Arthrodesis status: Secondary | ICD-10-CM | POA: Diagnosis not present

## 2021-02-07 DIAGNOSIS — G4733 Obstructive sleep apnea (adult) (pediatric): Secondary | ICD-10-CM | POA: Diagnosis present

## 2021-02-07 DIAGNOSIS — Z79890 Hormone replacement therapy (postmenopausal): Secondary | ICD-10-CM

## 2021-02-07 DIAGNOSIS — Z20822 Contact with and (suspected) exposure to covid-19: Secondary | ICD-10-CM | POA: Diagnosis not present

## 2021-02-07 DIAGNOSIS — M069 Rheumatoid arthritis, unspecified: Secondary | ICD-10-CM

## 2021-02-07 DIAGNOSIS — E876 Hypokalemia: Secondary | ICD-10-CM | POA: Diagnosis not present

## 2021-02-07 DIAGNOSIS — E1169 Type 2 diabetes mellitus with other specified complication: Secondary | ICD-10-CM

## 2021-02-07 DIAGNOSIS — E669 Obesity, unspecified: Secondary | ICD-10-CM | POA: Diagnosis present

## 2021-02-07 DIAGNOSIS — Z7984 Long term (current) use of oral hypoglycemic drugs: Secondary | ICD-10-CM

## 2021-02-07 DIAGNOSIS — I10 Essential (primary) hypertension: Secondary | ICD-10-CM | POA: Diagnosis present

## 2021-02-07 DIAGNOSIS — E782 Mixed hyperlipidemia: Secondary | ICD-10-CM | POA: Diagnosis not present

## 2021-02-07 DIAGNOSIS — E1142 Type 2 diabetes mellitus with diabetic polyneuropathy: Secondary | ICD-10-CM | POA: Diagnosis present

## 2021-02-07 DIAGNOSIS — R1084 Generalized abdominal pain: Secondary | ICD-10-CM

## 2021-02-07 DIAGNOSIS — E119 Type 2 diabetes mellitus without complications: Secondary | ICD-10-CM | POA: Diagnosis not present

## 2021-02-07 DIAGNOSIS — E7849 Other hyperlipidemia: Secondary | ICD-10-CM | POA: Diagnosis present

## 2021-02-07 DIAGNOSIS — K439 Ventral hernia without obstruction or gangrene: Secondary | ICD-10-CM | POA: Diagnosis present

## 2021-02-07 DIAGNOSIS — Z8249 Family history of ischemic heart disease and other diseases of the circulatory system: Secondary | ICD-10-CM | POA: Diagnosis not present

## 2021-02-07 DIAGNOSIS — E039 Hypothyroidism, unspecified: Secondary | ICD-10-CM

## 2021-02-07 DIAGNOSIS — F419 Anxiety disorder, unspecified: Secondary | ICD-10-CM | POA: Diagnosis present

## 2021-02-07 LAB — CBC WITH DIFFERENTIAL/PLATELET
Abs Immature Granulocytes: 0.03 10*3/uL (ref 0.00–0.07)
Basophils Absolute: 0.1 10*3/uL (ref 0.0–0.1)
Basophils Relative: 1 %
Eosinophils Absolute: 0.2 10*3/uL (ref 0.0–0.5)
Eosinophils Relative: 3 %
HCT: 38.7 % (ref 36.0–46.0)
Hemoglobin: 12.7 g/dL (ref 12.0–15.0)
Immature Granulocytes: 0 %
Lymphocytes Relative: 19 %
Lymphs Abs: 1.8 10*3/uL (ref 0.7–4.0)
MCH: 31 pg (ref 26.0–34.0)
MCHC: 32.8 g/dL (ref 30.0–36.0)
MCV: 94.4 fL (ref 80.0–100.0)
Monocytes Absolute: 0.9 10*3/uL (ref 0.1–1.0)
Monocytes Relative: 10 %
Neutro Abs: 6.5 10*3/uL (ref 1.7–7.7)
Neutrophils Relative %: 67 %
Platelets: 296 10*3/uL (ref 150–400)
RBC: 4.1 MIL/uL (ref 3.87–5.11)
RDW: 13.5 % (ref 11.5–15.5)
WBC: 9.5 10*3/uL (ref 4.0–10.5)
nRBC: 0 % (ref 0.0–0.2)

## 2021-02-07 LAB — COMPREHENSIVE METABOLIC PANEL
ALT: 17 U/L (ref 0–44)
AST: 22 U/L (ref 15–41)
Albumin: 3.9 g/dL (ref 3.5–5.0)
Alkaline Phosphatase: 77 U/L (ref 38–126)
Anion gap: 6 (ref 5–15)
BUN: 12 mg/dL (ref 8–23)
CO2: 28 mmol/L (ref 22–32)
Calcium: 8.4 mg/dL — ABNORMAL LOW (ref 8.9–10.3)
Chloride: 104 mmol/L (ref 98–111)
Creatinine, Ser: 0.51 mg/dL (ref 0.44–1.00)
GFR, Estimated: >60 mL/min (ref >60)
Glucose, Bld: 97 mg/dL (ref 70–99)
Potassium: 3.4 mmol/L — ABNORMAL LOW (ref 3.5–5.1)
Sodium: 138 mmol/L (ref 135–145)
Total Bilirubin: 0.5 mg/dL (ref 0.3–1.2)
Total Protein: 7.5 g/dL (ref 6.5–8.1)

## 2021-02-07 LAB — RESP PANEL BY RT-PCR (FLU A&B, COVID) ARPGX2
Influenza A by PCR: NEGATIVE
Influenza B by PCR: NEGATIVE
SARS Coronavirus 2 by RT PCR: NEGATIVE

## 2021-02-07 LAB — C-REACTIVE PROTEIN: CRP: 2.5 mg/dL — ABNORMAL HIGH (ref <1.0)

## 2021-02-07 LAB — LACTIC ACID, PLASMA: Lactic Acid, Venous: 0.9 mmol/L (ref 0.5–1.9)

## 2021-02-07 LAB — LIPASE, BLOOD: Lipase: 28 U/L (ref 11–51)

## 2021-02-07 MED ORDER — GABAPENTIN 300 MG PO CAPS
300.0000 mg | ORAL_CAPSULE | Freq: Every day | ORAL | Status: DC
  Administered 2021-02-08 – 2021-02-10 (×3): 300 mg via ORAL
  Filled 2021-02-07 (×3): qty 1

## 2021-02-07 MED ORDER — LEVOTHYROXINE SODIUM 88 MCG PO TABS
88.0000 ug | ORAL_TABLET | Freq: Every day | ORAL | Status: DC
  Administered 2021-02-09 – 2021-02-10 (×2): 88 ug via ORAL
  Filled 2021-02-07 (×3): qty 1

## 2021-02-07 MED ORDER — SODIUM CHLORIDE 0.9 % IV BOLUS
500.0000 mL | Freq: Once | INTRAVENOUS | Status: AC
  Administered 2021-02-07: 500 mL via INTRAVENOUS

## 2021-02-07 MED ORDER — ATORVASTATIN CALCIUM 40 MG PO TABS
40.0000 mg | ORAL_TABLET | Freq: Every day | ORAL | Status: DC
  Administered 2021-02-07 – 2021-02-09 (×3): 40 mg via ORAL
  Filled 2021-02-07 (×3): qty 1

## 2021-02-07 MED ORDER — ACETAMINOPHEN 325 MG PO TABS
650.0000 mg | ORAL_TABLET | Freq: Four times a day (QID) | ORAL | Status: DC | PRN
  Administered 2021-02-07 – 2021-02-10 (×6): 650 mg via ORAL
  Filled 2021-02-07 (×7): qty 2

## 2021-02-07 MED ORDER — ONDANSETRON HCL 4 MG/2ML IJ SOLN: 4.0000 mg | Freq: Four times a day (QID) | INTRAMUSCULAR | Status: DC | PRN

## 2021-02-07 MED ORDER — IOHEXOL 350 MG/ML SOLN
100.0000 mL | Freq: Once | INTRAVENOUS | Status: AC | PRN
  Administered 2021-02-07: 80 mL via INTRAVENOUS

## 2021-02-07 MED ORDER — PIPERACILLIN-TAZOBACTAM 3.375 G IVPB 30 MIN: 3.3750 g | Freq: Four times a day (QID) | INTRAVENOUS | Status: DC

## 2021-02-07 MED ORDER — POTASSIUM CHLORIDE IN NACL 20-0.9 MEQ/L-% IV SOLN: INTRAVENOUS | Status: AC

## 2021-02-07 MED ORDER — ACETAMINOPHEN 650 MG RE SUPP: 650.0000 mg | Freq: Four times a day (QID) | RECTAL | Status: DC | PRN

## 2021-02-07 MED ORDER — KETOROLAC TROMETHAMINE 30 MG/ML IJ SOLN
30.0000 mg | Freq: Four times a day (QID) | INTRAMUSCULAR | Status: DC | PRN
  Administered 2021-02-09 – 2021-02-10 (×3): 30 mg via INTRAVENOUS
  Filled 2021-02-07 (×3): qty 1

## 2021-02-07 MED ORDER — POTASSIUM CHLORIDE 2 MEQ/ML IV SOLN
INTRAVENOUS | Status: DC
  Filled 2021-02-07 (×2): qty 1000

## 2021-02-07 MED ORDER — PANTOPRAZOLE SODIUM 40 MG PO TBEC
40.0000 mg | DELAYED_RELEASE_TABLET | Freq: Two times a day (BID) | ORAL | Status: DC
  Administered 2021-02-07 – 2021-02-10 (×6): 40 mg via ORAL
  Filled 2021-02-07 (×6): qty 1

## 2021-02-07 MED ORDER — INSULIN ASPART 100 UNIT/ML IJ SOLN
0.0000 [IU] | Freq: Three times a day (TID) | INTRAMUSCULAR | Status: DC
  Administered 2021-02-09: 2 [IU] via SUBCUTANEOUS
  Administered 2021-02-09: 5 [IU] via SUBCUTANEOUS
  Administered 2021-02-09 – 2021-02-10 (×3): 2 [IU] via SUBCUTANEOUS

## 2021-02-07 MED ORDER — ONDANSETRON HCL 4 MG PO TABS: 4.0000 mg | ORAL_TABLET | Freq: Four times a day (QID) | ORAL | Status: DC | PRN

## 2021-02-07 MED ORDER — PIPERACILLIN-TAZOBACTAM 3.375 G IVPB 30 MIN
3.3750 g | Freq: Once | INTRAVENOUS | Status: AC
  Administered 2021-02-07: 3.375 g via INTRAVENOUS
  Filled 2021-02-07: qty 50

## 2021-02-07 MED ORDER — SERTRALINE HCL 50 MG PO TABS
100.0000 mg | ORAL_TABLET | Freq: Every day | ORAL | Status: DC
  Administered 2021-02-08 – 2021-02-10 (×3): 100 mg via ORAL
  Filled 2021-02-07 (×3): qty 2

## 2021-02-07 MED ORDER — CYCLOBENZAPRINE HCL 10 MG PO TABS: 10.0000 mg | ORAL_TABLET | Freq: Three times a day (TID) | ORAL | Status: DC | PRN

## 2021-02-07 MED ORDER — PIPERACILLIN-TAZOBACTAM 3.375 G IVPB
3.3750 g | Freq: Three times a day (TID) | INTRAVENOUS | Status: DC
  Administered 2021-02-07 – 2021-02-10 (×8): 3.375 g via INTRAVENOUS
  Filled 2021-02-07 (×8): qty 50

## 2021-02-07 NOTE — Progress Notes (Signed)
PHARMACY NOTE:  ANTIMICROBIAL RENAL DOSAGE ADJUSTMENT  Current antimicrobial regimen includes a mismatch between antimicrobial dosage and estimated renal function.  As per policy approved by the Pharmacy & Therapeutics and Medical Executive Committees, the antimicrobial dosage will be adjusted accordingly.  Current antimicrobial dosage:  Zosyn 3.375gm IV q6h  Indication: Intra-abdominal infection  Renal Function:  Estimated Creatinine Clearance: 84.3 mL/min (by C-G formula based on SCr of 0.51 mg/dL).    Antimicrobial dosage has been changed to:  Zosyn 3.375gm IV q8h (extended interval where each dose is infused over 4 hours)  Thank you for allowing pharmacy to be a part of this patient's care.  Everette Rank, [facility] 02/07/2021 10:21 PM

## 2021-02-07 NOTE — Telephone Encounter (Signed)
done

## 2021-02-07 NOTE — ED Notes (Signed)
Patient transported to CT 

## 2021-02-07 NOTE — Progress Notes (Signed)
[facility]  Scan reviewed, thickened bowel and one foci of air potentially extraluminal but not definitive.  Is contained. Patient is hemodynamically stable and Dr. Alvino Chapel says tender but not peritoneal and is able to get comfortable. Labs are all reassuring without any leukocytosis. This process has been going on for over 2 weeks and prior CT done with similar thickening of the bowel.  BP 134/87   Pulse 65   Temp 98.2 F (36.8 C) (Oral)   Resp 16   Ht '5\' 8"'$  (1.727 m)   Wt 99.8 kg   SpO2 97%   BMI 33.45 kg/m   Make NPO. Antibiotics to cover for enteritis, possible perforation  Admit to hospitalist Covid lab needs to be ordered  May ultimately need exploration to determine what is going on Differential could be enteritis, foreign body, Meckel's.   Curlene Labrum, MD [facility] [address] [place], [place] [phone] (office)

## 2021-02-07 NOTE — Telephone Encounter (Signed)
Dr. Willey Blade called me about this patient last week.  4-week history of abdominal pain without obstructing symptoms or obvious bleeding.  Underwent a CT scan 8/ 9/22 which revealed colonic diverticulosis but no diverticulitis; focally inflamed process central upper pelvis associated with small bowel loops/bowel wall thickening.  Differential was felt to be enteritis, /? IBD vs focal perforation from foreign body.  ? Meckel's. Patient treated with a 1 week course of Augmentin.  Back in Dr. Ria Comment office today without improvement;  fever noted a couple of days ago.  She is not feeling well.  Plans were to have her follow up in my office.  To expedite her evaluation, I recommended she go to the ED for further evaluation. I have discussed with Dr. Alvino Chapel, the [facility].

## 2021-02-07 NOTE — H&P (Addendum)
History and Physical    Monique Weiss [id number] DOB: Dec 01, 1955 DOA: 02/07/2021  PCP: Asencion Noble, MD  Patient coming from: Home   Chief Complaint: Abdominal Pain, Diarrhea    HPI:    65 year old female with past medical history of large ventral hernia, hyperlipidemia, hypothyroidism, gastroesophageal reflux disease, diabetes mellitus type 2, obstructive sleep apnea (not on CPAP) and rheumatoid arthritis presenting to [facility] emergency department at the direction of her primary care provider for abdominal pain.  Patient explains that approximately 6 or 7 weeks ago she began to experience diarrhea.  Diarrhea is watery and nonbloody.  Patient explains that she has been having diarrhea upwards of 7 times daily.  Diarrhea continue to persist over the following 2 weeks and approximately 4 weeks ago patient began to experience abdominal pain.  Patient describes abdominal pain as periumbilical in location, moderate to severe in intensity and sharp in quality.  Pain was worse with movement without any alleviating factors.  Patient denies any associated nausea or vomiting.  Patient's pain persisted for several days and she presented to a local urgent care clinic on 7/13 for evaluation.  During that urgent care clinic visit abdominal x-ray was performed and was found to be unremarkable.  Due to the severity of the patient's abdominal pain patient was instructed to go to the emergency department but decided not to do so.  Patient's pain continued to persist and 1 to 2 weeks later she eventually presented to see her her primary care provider Dr. Willey Blade who placed her on a course of amoxicillin for suspected enteritis.  Patient took this antibiotic for approximately 7 days without any improvement in symptoms and at that point patient was sent for CT imaging of the abdomen and pelvis on 8/9.  This revealed a focal inflammatory process associated with the small bowel loops with wall thickening  and mesenteric inflammation.  After Dr. Willey Blade discussed the case with [place] with gastroenterology decision was made to place patient on a course of Augmentin and monitor for response.  Once again, course of antibiotics did not improve patient's ongoing abdominal pain and diarrhea and patient presented once again to her primary care provider's office.  After a repeat discussion with Dr. Gala Romney with gastroenterology patient was instructed to go to the emergency department for further evaluation.  Upon evaluation in the emergency department repeat CT imaging of the abdomen and pelvis was performed.  This once again redemonstrated abnormal small bowel loops with wall thickening and mesenteric congestion and edema however this time there is concern for clustering of gas bubbles identified that appeared to possibly be extraluminal concerning for microperforation.  After discussion with Dr. Gala Romney with gastroenterology, ER provider then discussed case with Dr. Constance Haw with general surgery who recommended intravenous antibiotics, to make the patient n.p.o. after midnight and that surgery would formally consult on the patient in the morning.  The hospitalist group was then called to assess patient for admission to the hospital.  Review of Systems:   Review of Systems  Gastrointestinal:  Positive for abdominal pain and diarrhea.  Neurological:  Positive for weakness.  All other systems reviewed and are negative.  Past Medical History:  Diagnosis Date   Anxiety    Arthritis    Complication of anesthesia    Hard to wake up   Diabetes mellitus without complication (HCC)    Type II   Hyperlipidemia    Hypertension    Hypothyroidism    PONV (postoperative nausea and vomiting)  Thyroid disease    Hypo    Past Surgical History:  Procedure Laterality Date   ANTERIOR CERVICAL CORPECTOMY N/A 07/27/2018   Procedure: Cervical five Anterior cervical corpectomy with Cervical six-seven Anterior cervical  discectomy and fusion;  Surgeon: Judith Part, MD;  Location: [place];  Service: Neurosurgery;  Laterality: N/A;   BACK SURGERY      fusions x3   BELPHAROPTOSIS REPAIR     Bilateral   CHOLECYSTECTOMY N/A 01/14/2018   Procedure: LAPAROSCOPIC CHOLECYSTECTOMY;  Surgeon: Virl Cagey, MD;  Location: [facility];  Service: General;  Laterality: N/A;   DILATION AND CURETTAGE OF UTERUS     EYE SURGERY     JOINT REPLACEMENT     REPLACEMENT TOTAL KNEE     SHOULDER SURGERY       reports that she has never smoked. She has never used smokeless tobacco. She reports that she does not drink alcohol and does not use drugs.  No Known Allergies  Family History  Problem Relation Age of Onset   Heart disease Mother    Diabetes Mother    Hypertension Mother    Heart failure Mother    Heart attack Father    Prostate cancer Father    Heart disease Sister    Hypertension Sister    Diabetes Sister    Heart attack Brother    Heart disease Brother    Hypertension Brother    Diabetes Brother    Hypertension Son    Diabetes Brother    Diabetes Brother    Hypertension Sister      Prior to Admission medications   Medication Sig Start Date End Date Taking? Authorizing Provider  Acetaminophen 325 MG CAPS Take 650 mg by mouth every 6 (six) hours as needed.   Yes [provider]  amoxicillin (AMOXIL) 500 MG capsule Take 2,000 mg by mouth once. 01/23/21  Yes [provider]  atorvastatin (LIPITOR) 40 MG tablet Take 40 mg by mouth at bedtime.  10/22/17  Yes [provider]  cyclobenzaprine (FLEXERIL) 10 MG tablet Take 10 mg by mouth 3 (three) times daily as needed for muscle spasms.    Yes [provider]  furosemide (LASIX) 40 MG tablet Take 80 mg by mouth daily.   Yes [provider]  gabapentin (NEURONTIN) 300 MG capsule Take 300 mg by mouth daily. 02/04/21  Yes [provider]  KEVZARA 200 MG/1.14ML SOAJ 200 mg every 14 (fourteen) days. 12/16/19   Yes [provider]  levothyroxine (SYNTHROID) 88 MCG tablet Take 88 mcg by mouth daily. 01/30/21  Yes [provider]  linaclotide (LINZESS) 145 MCG CAPS capsule Take 145 mcg by mouth daily as needed (constipation).   Yes [provider]  metFORMIN (GLUCOPHAGE) 1000 MG tablet Take 1,000 mg by mouth 2 (two) times daily with a meal.  12/18/17  Yes [provider]  pantoprazole (PROTONIX) 40 MG tablet Take 40 mg by mouth 2 (two) times daily. 01/10/21  Yes [provider]  promethazine (PHENERGAN) 12.5 MG tablet Take 12.5-25 mg by mouth every 6 (six) hours as needed for nausea or vomiting.  01/02/18  Yes [provider]  sertraline (ZOLOFT) 100 MG tablet Take 100 mg by mouth daily. 01/25/21  Yes [provider]  spironolactone (ALDACTONE) 25 MG tablet Take 25 mg by mouth every other day.   Yes [provider]  amoxicillin-clavulanate (AUGMENTIN) 875-125 MG tablet Take 1 tablet by mouth 2 (two) times daily. Patient not taking:  No sig reported 01/31/21   [provider]  clobetasol cream (TEMOVATE) [id number] % Apply 1 application topically 2 (two) times daily. Patient not taking: No sig reported 04/06/20   Derrek Monaco A, NP  docusate sodium (COLACE) 100 MG capsule Take 1 capsule (100 mg total) by mouth daily as needed. Patient not taking: No sig reported 07/28/18   Judith Part, MD  hydroxychloroquine (PLAQUENIL) 200 MG tablet Take 200 mg by mouth 2 (two) times daily. Patient not taking: No sig reported 02/05/21   [provider]  ibuprofen (ADVIL) 800 MG tablet Take 800 mg by mouth 3 (three) times daily as needed. Patient not taking: No sig reported 02/14/20   [provider]  levothyroxine (SYNTHROID, LEVOTHROID) 75 MCG tablet Take 75 mcg by mouth daily before breakfast.  Patient not taking: Reported on 02/07/2021 12/29/17   [provider]  sertraline (ZOLOFT) 50 MG tablet Take 100 mg by mouth  daily. Patient not taking: Reported on 02/07/2021 12/11/17   [provider]    Physical Exam: Vitals:   02/07/21 1300 02/07/21 1330 02/07/21 1925 02/07/21 2112  BP: 138/81 134/87 (!) 151/81 140/73  Pulse: 63 65 67 64  Resp:  16 20   Temp:   99 F (37.2 C) 98.2 F (36.8 C)  TempSrc:   Oral Oral  SpO2: 95% 97% 96% 96%  Weight:      Height:        Constitutional: Awake alert and oriented x3, patient is in mild distress due to pain. Skin: no rashes, no lesions, poor skin turgor noted. Eyes: Pupils are equally reactive to light.  No evidence of scleral icterus or conjunctival pallor.  ENMT: Dry mucous membranes noted.  Posterior pharynx clear of any exudate or lesions.   Neck: normal, supple, no masses, no thyromegaly.  No evidence of jugular venous distension.   Respiratory: clear to auscultation bilaterally, no wheezing, no crackles. Normal respiratory effort. No accessory muscle use.  Cardiovascular: Regular rate and rhythm, no murmurs / rubs / gallops. No extremity edema. 2+ pedal pulses. No carotid bruits.  Chest:   Nontender without crepitus or deformity.   Back:   Nontender without crepitus or deformity. Abdomen: Extremely large ventral abdominal wall hernia noted that is reproducible.  Notable generalized abdominal tenderness, worst in the periumbilical and epigastric regions.  Abdomen is soft however.   Positive bowel sounds noted in all quadrants.   Musculoskeletal: No joint deformity upper and lower extremities. Good ROM, no contractures. Normal muscle tone.  Neurologic: CN 2-12 grossly intact. Sensation intact.  Patient moving all 4 extremities spontaneously.  Patient is following all commands.  Patient is responsive to verbal stimuli.   Psychiatric: Patient exhibits normal mood with appropriate affect.  Patient seems to possess insight as to their current situation.     Labs on Admission: I have personally reviewed following labs and imaging studies -    CBC: Recent Labs  Lab 02/07/21 1203  WBC 9.5  NEUTROABS 6.5  HGB 12.7  HCT 38.7  MCV 94.4  PLT 0000000   Basic Metabolic Panel: Recent Labs  Lab 02/07/21 1203  NA 138  K 3.4*  CL 104  CO2 28  GLUCOSE 97  BUN 12  CREATININE 0.51  CALCIUM 8.4*   GFR: Estimated Creatinine Clearance: 84.3 mL/min (by C-G formula based on SCr of 0.51 mg/dL). Liver Function Tests: Recent Labs  Lab 02/07/21 1203  AST 22  ALT 17  ALKPHOS 77  BILITOT 0.5  PROT 7.5  ALBUMIN 3.9   Recent Labs  Lab 02/07/21 1203  LIPASE 28   No results for input(s): AMMONIA in the last 168 hours. Coagulation Profile: No results for input(s): INR, PROTIME in the last 168 hours. Cardiac Enzymes: No results for input(s): CKTOTAL, CKMB, CKMBINDEX, TROPONINI in the last 168 hours. BNP (last 3 results) No results for input(s): PROBNP in the last 8760 hours. HbA1C: No results for input(s): HGBA1C in the last 72 hours. CBG: No results for input(s): GLUCAP in the last 168 hours. Lipid Profile: No results for input(s): CHOL, HDL, LDLCALC, TRIG, CHOLHDL, LDLDIRECT in the last 72 hours. Thyroid Function Tests: No results for input(s): TSH, T4TOTAL, FREET4, T3FREE, THYROIDAB in the last 72 hours. Anemia Panel: No results for input(s): VITAMINB12, FOLATE, FERRITIN, TIBC, IRON, RETICCTPCT in the last 72 hours. Urine analysis:    Component Value Date/Time   COLORURINE AMBER (A) 01/12/2018 1217   APPEARANCEUR Cloudy (A) 04/06/2020 1635   LABSPEC 1.014 01/12/2018 1217   PHURINE 5.0 01/12/2018 1217   GLUCOSEU Negative 04/06/2020 1635   GLUCOSEU Negative 04/06/2020 1507   HGBUR NEGATIVE 01/12/2018 1217   BILIRUBINUR Negative 04/06/2020 1635   KETONESUR NEGATIVE 01/12/2018 1217   PROTEINUR Negative 04/06/2020 1635   PROTEINUR 30 (A) 01/12/2018 1217   NITRITE Positive (A) 04/06/2020 1635   NITRITE pos 04/06/2020 1507   NITRITE NEGATIVE 01/12/2018 1217   LEUKOCYTESUR 3+ (A) 04/06/2020 1635   LEUKOCYTESUR  Large (3+) (A) 04/06/2020 1507    Radiological Exams on Admission - Personally Reviewed: CT ABDOMEN PELVIS W CONTRAST  Result Date: 02/07/2021 CLINICAL DATA:  Abdominal pain and swelling.  Diarrhea. EXAM: CT ABDOMEN AND PELVIS WITH CONTRAST TECHNIQUE: Multidetector CT imaging of the abdomen and pelvis was performed using the standard protocol following bolus administration of intravenous contrast. CONTRAST:  55m OMNIPAQUE IOHEXOL 350 MG/ML SOLN COMPARISON:  01/30/2021. FINDINGS: Lower chest: Stable scarring in the lingula. Hepatobiliary: No suspicious focal abnormality within the liver parenchyma. Gallbladder surgically absent. No intrahepatic or extrahepatic biliary dilation. Pancreas: No focal mass lesion. No dilatation of the main duct. No intraparenchymal cyst. No peripancreatic edema. Spleen: No splenomegaly. No focal mass lesion. Adrenals/Urinary Tract: No adrenal nodule or mass. Kidneys unremarkable. No evidence for hydroureter. The urinary bladder appears normal for the degree of distention. Stomach/Bowel: Stomach is decompressed. Duodenum is normally positioned as is the ligament of Treitz. Duodenal diverticulum noted. Small bowel loops in the upper right pelvis again show wall thickening with mesenteric congestion and edema. Small cluster of tiny gas bubbles identified on image 51 of series 2 appears to be but is not definitely extraluminal. The previously described small lymph nodes in this region are similar. The small bowel wall in this region is ill-defined suggesting perienteric edema. The terminal ileum is normal. The appendix is not well visualized, but there is no edema or inflammation in the region of the cecum. No gross colonic mass. No colonic wall thickening. Diverticular changes are noted in the left colon without evidence of diverticulitis. Large stool volume evident. Vascular/Lymphatic: No abdominal aortic aneurysm. 11 mm aortocaval node on 34/2 is mildly enlarged. As noted above,  clustered increased number of nodes identified in the small bowel mesentery of the upper pelvis, in the region of the abnormal small bowel loops. No pelvic sidewall lymphadenopathy. Reproductive: Unremarkable. Other: No intraperitoneal free fluid. No gross intraperitoneal free air. Musculoskeletal: Tiny umbilical hernia contains only fat. No worrisome lytic or sclerotic osseous abnormality. Multilevel lumbar fusion evident. IMPRESSION: 1. Similar  appearance of abnormal small bowel loops in the upper central and right pelvis with wall thickening, mesenteric congestion, and edema. Small cluster of tiny gas bubbles identified on today's study appears to be but is not definitely extraluminal. This new finding of clustered tiny gas bubbles suggests contained micro perforation. There is no intraperitoneal free fluid or gross intraperitoneal free gas. Small-bowel abnormality may be related to infectious/inflammatory enteritis with perforation or foreign body perforation. Small bowel neoplasm not excluded. No discrete or drainable abscess present at this time. Repeat CT imaging with oral and intravenous contrast may prove helpful to further evaluate. 2. Similar appearance of clustered increased number of nodes in the small bowel mesentery of the upper pelvis, similar to prior with mild retroperitoneal lymphadenopathy as before. 3. Large stool volume. Imaging features could be compatible with clinical constipation. 4. Left colonic diverticulosis without diverticulitis. 5. Aortic Atherosclerosis (ICD10-I70.0). Electronically Signed   By: Misty Stanley M.D.   On: 02/07/2021 15:19     Assessment/Plan Principal Problem:   Abdominal pain with concurrent acute diarrhea  Patient presenting with over 4-week history of abdominal pain as well as a 6-week history of ongoing watery diarrhea Overall etiology is still unclear.  CT imaging of the abdomen and pelvis reveals persisting abnormal small bowel loops with bowel wall  thickening and mesenteric edema with now concern for questionable extraluminal bubbles concerning for microperforation. Appreciate input by general surgery and gastroenterology.  Will treat patient with broad-spectrum intravenous antibiotics with Zosyn for now.  This would cover for enteritis or enteric pathogens in case of microperforation Will make patient n.p.o. after midnight General surgery to formally evaluate in the morning and decide if whether or not surgical exploration is necessary If surgery ends up not believing that surgical evaluation is necessary then in that case we will consult gastroenterology Hydrating patient with intravenous isotonic fluids As needed intravenous antiemetics and analgesics Serial abdominal exams Sending off stool studies including GI pathology panel and C. difficile testing considering ongoing diarrhea for 6 weeks.  Active Problems:   Rheumatoid arthritis ([place])  Ongoing immunomodulating therapy places patient in an immunocompromised state Continue outpatient follow-up    Mixed diabetic hyperlipidemia associated with type 2 diabetes mellitus ([place])  Continue home regimen of statin therapy    Type 2 diabetes mellitus without complication, complicated by diabetic polyneuropathy without long-term use of insulin  Patient been placed on Accu-Cheks before every meal and nightly with sliding scale insulin Holding home regimen of hypoglycemics Hemoglobin A1C ordered Diabetic Diet    GERD without esophagitis  Continuing home regimen of daily PPI therapy.    Hypothyroidism  Resume home regimen of Synthroid    Code Status:  Full code Family Communication: Deferred  Status is: Observation  The patient remains OBS appropriate and will d/c before 2 midnights.  Dispo: The patient is from: Home              Anticipated d/c is to: Home              Patient currently is not medically stable to d/c.   Difficult to place patient No        Vernelle Emerald MD [facility] Pager [phone]  If 7PM-7AM, please contact night-coverage [email or web address] Use universal [place] password for that web site. If you do not have the password, please call the hospital operator.  02/07/2021, 10:20 PM

## 2021-02-07 NOTE — ED Provider Notes (Signed)
[facility] EMERGENCY DEPARTMENT Provider Note   CSN: [id number] Arrival date & time: 02/07/21  1135     History No chief complaint on file.   Monique Weiss is a 65 y.o. female.  The history is provided by the patient.  Patient presents with abdominal pain.  Has had over the last month.  Has been dull.  Had a CT scan done over a week ago showed potentially enteritis versus other potential cause.  Had been on Augmentin.  Continued pain.  Pain is diffuse.  Belly will swell up.  States she has had diarrhea.  Decreased appetite.  Has been seen by PCP and discussed with Dr. Gala Romney, who called me and we will get blood work and repeat CT scan.    Past Medical History:  Diagnosis Date   Anxiety    Arthritis    Complication of anesthesia    Hard to wake up   Diabetes mellitus without complication (HCC)    Type II   Hyperlipidemia    Hypertension    Hypothyroidism    PONV (postoperative nausea and vomiting)    Thyroid disease    Hypo    Patient Active Problem List   Diagnosis Date Noted   Mixed stress and urge urinary incontinence 04/06/2020   Cystocele with uterine descensus 04/06/2020   Encounter for cervical Pap smear with pelvic exam [id number]   Lichen sclerosus et atrophicus 04/06/2020   Vaginal itching 04/06/2020   Dysuria 04/06/2020   Urinary tract infection with hematuria 04/06/2020   Cervical myelopathy ([place]) 07/27/2018   Diastasis recti 01/14/2018   Acute cholecystitis 01/12/2018   UTI (urinary tract infection) 01/12/2018   Hyperlipidemia 01/12/2018   Anxiety 01/12/2018   Atelectasis of lingula 01/12/2018    Past Surgical History:  Procedure Laterality Date   ANTERIOR CERVICAL CORPECTOMY N/A 07/27/2018   Procedure: Cervical five Anterior cervical corpectomy with Cervical six-seven Anterior cervical discectomy and fusion;  Surgeon: Judith Part, MD;  Location: [place];  Service: Neurosurgery;  Laterality: N/A;   BACK SURGERY      fusions x3    BELPHAROPTOSIS REPAIR     Bilateral   CHOLECYSTECTOMY N/A 01/14/2018   Procedure: LAPAROSCOPIC CHOLECYSTECTOMY;  Surgeon: Virl Cagey, MD;  Location: [facility];  Service: General;  Laterality: N/A;   DILATION AND CURETTAGE OF UTERUS     EYE SURGERY     JOINT REPLACEMENT     REPLACEMENT TOTAL KNEE     SHOULDER SURGERY       OB History     Gravida  3   Para  3   Term      Preterm      AB      Living  2      SAB      IAB      Ectopic      Multiple      Live Births              Family History  Problem Relation Age of Onset   Heart disease Mother    Diabetes Mother    Hypertension Mother    Heart failure Mother    Heart attack Father    Prostate cancer Father    Heart disease Sister    Hypertension Sister    Diabetes Sister    Heart attack Brother    Heart disease Brother    Hypertension Brother    Diabetes Brother    Hypertension Son  Diabetes Brother    Diabetes Brother    Hypertension Sister     Social History   Tobacco Use   Smoking status: Never   Smokeless tobacco: Never  Vaping Use   Vaping Use: Never used  Substance Use Topics   Alcohol use: Never   Drug use: Never    Home Medications Prior to Admission medications   Medication Sig Start Date End Date Taking? Authorizing Provider  Acetaminophen 325 MG CAPS Take 650 mg by mouth every 6 (six) hours as needed.    [provider]  atorvastatin (LIPITOR) 40 MG tablet Take 40 mg by mouth at bedtime.  10/22/17   [provider]  clobetasol cream (TEMOVATE) [id number] % Apply 1 application topically 2 (two) times daily. 04/06/20   Estill Dooms, NP  cyclobenzaprine (FLEXERIL) 10 MG tablet Take 10 mg by mouth 3 (three) times daily as needed for muscle spasms.     [provider]  docusate sodium (COLACE) 100 MG capsule Take 1 capsule (100 mg total) by mouth daily as needed. 07/28/18   Judith Part, MD  furosemide (LASIX) 40 MG tablet Take 80 mg by mouth  daily.    [provider]  ibuprofen (ADVIL) 800 MG tablet Take 800 mg by mouth 3 (three) times daily as needed. 02/14/20   [provider]  KEVZARA 200 MG/1.14ML SOAJ  12/16/19   [provider]  levothyroxine (SYNTHROID, LEVOTHROID) 75 MCG tablet Take 75 mcg by mouth daily before breakfast.  12/29/17   [provider]  linaclotide (LINZESS) 145 MCG CAPS capsule Take 145 mcg by mouth daily as needed (constipation).    [provider]  metFORMIN (GLUCOPHAGE) 1000 MG tablet Take 1,000 mg by mouth 2 (two) times daily with a meal.  12/18/17   [provider]  promethazine (PHENERGAN) 12.5 MG tablet Take 12.5-25 mg by mouth every 6 (six) hours as needed for nausea or vomiting.  01/02/18   [provider]  sertraline (ZOLOFT) 50 MG tablet Take 50 mg by mouth daily.  12/11/17   [provider]  spironolactone (ALDACTONE) 25 MG tablet Take 25 mg by mouth daily.     [provider]    Allergies    Patient has no known allergies.  Review of Systems   Review of Systems  Constitutional:  Positive for appetite change and fatigue. Negative for fever.  HENT:  Negative for congestion.   Cardiovascular:  Negative for chest pain.  Gastrointestinal:  Positive for abdominal pain, diarrhea and nausea. Negative for vomiting.  Genitourinary:  Negative for flank pain.  Musculoskeletal:  Negative for back pain.  Skin:  Negative for rash.  Neurological:  Negative for seizures.  Psychiatric/Behavioral:  Negative for confusion.    Physical Exam Updated Vital Signs BP 134/87   Pulse 65   Temp 98.2 F (36.8 C) (Oral)   Resp 16   Ht '5\' 8"'$  (1.727 m)   Wt 99.8 kg   SpO2 97%   BMI 33.45 kg/m   Physical Exam Vitals and nursing note reviewed.  HENT:     Head: Normocephalic.  Eyes:     Extraocular Movements: Extraocular movements intact.     Pupils: Pupils are equal, round, and reactive to light.  Cardiovascular:     Rate and  Rhythm: Regular rhythm.  Pulmonary:     Breath sounds: No wheezing or rhonchi.  Abdominal:     General: There is distension.     Tenderness: There is  abdominal tenderness.     Comments: Diffuse abdominal tenderness with some distention.  No clear hernia palpated.  No ecchymosis.  No rebound or guarding.  Musculoskeletal:        General: No tenderness.     Cervical back: Neck supple.  Skin:    General: Skin is warm.     Capillary Refill: Capillary refill takes less than 2 seconds.  Neurological:     Mental Status: She is alert and oriented to person, place, and time.    ED Results / Procedures / Treatments   Labs (all labs ordered are listed, but only abnormal results are displayed) Labs Reviewed  COMPREHENSIVE METABOLIC PANEL - Abnormal; Notable for the following components:      Result Value   Potassium 3.4 (*)    Calcium 8.4 (*)    All other components within normal limits  LIPASE, BLOOD  CBC WITH DIFFERENTIAL/PLATELET    EKG None  Radiology CT ABDOMEN PELVIS W CONTRAST  Result Date: 02/07/2021 CLINICAL DATA:  Abdominal pain and swelling.  Diarrhea. EXAM: CT ABDOMEN AND PELVIS WITH CONTRAST TECHNIQUE: Multidetector CT imaging of the abdomen and pelvis was performed using the standard protocol following bolus administration of intravenous contrast. CONTRAST:  37m OMNIPAQUE IOHEXOL 350 MG/ML SOLN COMPARISON:  01/30/2021. FINDINGS: Lower chest: Stable scarring in the lingula. Hepatobiliary: No suspicious focal abnormality within the liver parenchyma. Gallbladder surgically absent. No intrahepatic or extrahepatic biliary dilation. Pancreas: No focal mass lesion. No dilatation of the main duct. No intraparenchymal cyst. No peripancreatic edema. Spleen: No splenomegaly. No focal mass lesion. Adrenals/Urinary Tract: No adrenal nodule or mass. Kidneys unremarkable. No evidence for hydroureter. The urinary bladder appears normal for the degree of distention. Stomach/Bowel: Stomach is  decompressed. Duodenum is normally positioned as is the ligament of Treitz. Duodenal diverticulum noted. Small bowel loops in the upper right pelvis again show wall thickening with mesenteric congestion and edema. Small cluster of tiny gas bubbles identified on image 51 of series 2 appears to be but is not definitely extraluminal. The previously described small lymph nodes in this region are similar. The small bowel wall in this region is ill-defined suggesting perienteric edema. The terminal ileum is normal. The appendix is not well visualized, but there is no edema or inflammation in the region of the cecum. No gross colonic mass. No colonic wall thickening. Diverticular changes are noted in the left colon without evidence of diverticulitis. Large stool volume evident. Vascular/Lymphatic: No abdominal aortic aneurysm. 11 mm aortocaval node on 34/2 is mildly enlarged. As noted above, clustered increased number of nodes identified in the small bowel mesentery of the upper pelvis, in the region of the abnormal small bowel loops. No pelvic sidewall lymphadenopathy. Reproductive: Unremarkable. Other: No intraperitoneal free fluid. No gross intraperitoneal free air. Musculoskeletal: Tiny umbilical hernia contains only fat. No worrisome lytic or sclerotic osseous abnormality. Multilevel lumbar fusion evident. IMPRESSION: 1. Similar appearance of abnormal small bowel loops in the upper central and right pelvis with wall thickening, mesenteric congestion, and edema. Small cluster of tiny gas bubbles identified on today's study appears to be but is not definitely extraluminal. This new finding of clustered tiny gas bubbles suggests contained micro perforation. There is no intraperitoneal free fluid or gross intraperitoneal free gas. Small-bowel abnormality may be related to infectious/inflammatory enteritis with perforation or foreign body perforation. Small bowel neoplasm not excluded. No discrete or drainable abscess  present at this time. Repeat CT imaging with oral and intravenous contrast may prove helpful to  further evaluate. 2. Similar appearance of clustered increased number of nodes in the small bowel mesentery of the upper pelvis, similar to prior with mild retroperitoneal lymphadenopathy as before. 3. Large stool volume. Imaging features could be compatible with clinical constipation. 4. Left colonic diverticulosis without diverticulitis. 5. Aortic Atherosclerosis (ICD10-I70.0). Electronically Signed   By: Misty Stanley M.D.   On: 02/07/2021 15:19    Procedures Procedures   Medications Ordered in ED Medications  piperacillin-tazobactam (ZOSYN) IVPB 3.375 g (has no administration in time range)  sodium chloride 0.9 % bolus 500 mL (0 mLs Intravenous Stopped 02/07/21 1320)  iohexol (OMNIPAQUE) 350 MG/ML injection 100 mL (80 mLs Intravenous Contrast Given 02/07/21 1439)    ED Course  I have reviewed the triage vital signs and the nursing notes.  Pertinent labs & imaging results that were available during my care of the patient were reviewed by me and considered in my medical decision making (see chart for details).    MDM Rules/Calculators/A&P                           Patient with abdominal pain.  Is had for the last month.  CT scan showed enteritis just over a week ago.  Had been on Augmentin but no real improvement.  Saw PCP and also discussed with GI who sent her in.  CT scan repeated and showed similar enteritis but also possibly some extraluminal air.  Discussed with Dr. Constance Haw from general surgery.  Will see patient.  Recommend antibiotics.  No immediate plan for OR.  Will admit to internal medicine. Final Clinical Impression(s) / ED Diagnoses Final diagnoses:  Enteritis    Rx / DC Orders ED Discharge Orders     None        Davonna Belling, MD 02/07/21 1549

## 2021-02-07 NOTE — ED Triage Notes (Signed)
Pt states she was sent from Dr. Ria Comment office for bloodwork. Abd pain and intermittent swelling x 1 month. Denies N/V, states has had some diarrhea and lack of appetite.

## 2021-02-08 DIAGNOSIS — E1169 Type 2 diabetes mellitus with other specified complication: Secondary | ICD-10-CM | POA: Diagnosis not present

## 2021-02-08 DIAGNOSIS — E039 Hypothyroidism, unspecified: Secondary | ICD-10-CM | POA: Diagnosis not present

## 2021-02-08 DIAGNOSIS — I4510 Unspecified right bundle-branch block: Secondary | ICD-10-CM | POA: Diagnosis not present

## 2021-02-08 DIAGNOSIS — E669 Obesity, unspecified: Secondary | ICD-10-CM | POA: Diagnosis present

## 2021-02-08 DIAGNOSIS — Z7984 Long term (current) use of oral hypoglycemic drugs: Secondary | ICD-10-CM | POA: Diagnosis not present

## 2021-02-08 DIAGNOSIS — K439 Ventral hernia without obstruction or gangrene: Secondary | ICD-10-CM | POA: Diagnosis present

## 2021-02-08 DIAGNOSIS — K219 Gastro-esophageal reflux disease without esophagitis: Secondary | ICD-10-CM | POA: Diagnosis not present

## 2021-02-08 DIAGNOSIS — Z20822 Contact with and (suspected) exposure to covid-19: Secondary | ICD-10-CM | POA: Diagnosis not present

## 2021-02-08 DIAGNOSIS — K5000 Crohn's disease of small intestine without complications: Secondary | ICD-10-CM | POA: Diagnosis not present

## 2021-02-08 DIAGNOSIS — R1013 Epigastric pain: Secondary | ICD-10-CM | POA: Diagnosis not present

## 2021-02-08 DIAGNOSIS — R197 Diarrhea, unspecified: Secondary | ICD-10-CM | POA: Diagnosis not present

## 2021-02-08 DIAGNOSIS — Z6833 Body mass index (BMI) 33.0-33.9, adult: Secondary | ICD-10-CM | POA: Diagnosis not present

## 2021-02-08 DIAGNOSIS — K631 Perforation of intestine (nontraumatic): Secondary | ICD-10-CM

## 2021-02-08 DIAGNOSIS — E7849 Other hyperlipidemia: Secondary | ICD-10-CM | POA: Diagnosis present

## 2021-02-08 DIAGNOSIS — E1142 Type 2 diabetes mellitus with diabetic polyneuropathy: Secondary | ICD-10-CM | POA: Diagnosis not present

## 2021-02-08 DIAGNOSIS — Z833 Family history of diabetes mellitus: Secondary | ICD-10-CM | POA: Diagnosis not present

## 2021-02-08 DIAGNOSIS — E119 Type 2 diabetes mellitus without complications: Secondary | ICD-10-CM | POA: Diagnosis not present

## 2021-02-08 DIAGNOSIS — Z8249 Family history of ischemic heart disease and other diseases of the circulatory system: Secondary | ICD-10-CM | POA: Diagnosis not present

## 2021-02-08 DIAGNOSIS — F419 Anxiety disorder, unspecified: Secondary | ICD-10-CM | POA: Diagnosis present

## 2021-02-08 DIAGNOSIS — E782 Mixed hyperlipidemia: Secondary | ICD-10-CM | POA: Diagnosis not present

## 2021-02-08 DIAGNOSIS — Z96659 Presence of unspecified artificial knee joint: Secondary | ICD-10-CM | POA: Diagnosis present

## 2021-02-08 DIAGNOSIS — G4733 Obstructive sleep apnea (adult) (pediatric): Secondary | ICD-10-CM | POA: Diagnosis present

## 2021-02-08 DIAGNOSIS — E876 Hypokalemia: Secondary | ICD-10-CM | POA: Diagnosis not present

## 2021-02-08 DIAGNOSIS — Z79899 Other long term (current) drug therapy: Secondary | ICD-10-CM | POA: Diagnosis not present

## 2021-02-08 DIAGNOSIS — Z79890 Hormone replacement therapy (postmenopausal): Secondary | ICD-10-CM | POA: Diagnosis not present

## 2021-02-08 DIAGNOSIS — I10 Essential (primary) hypertension: Secondary | ICD-10-CM | POA: Diagnosis not present

## 2021-02-08 DIAGNOSIS — F32A Depression, unspecified: Secondary | ICD-10-CM | POA: Diagnosis not present

## 2021-02-08 DIAGNOSIS — M069 Rheumatoid arthritis, unspecified: Secondary | ICD-10-CM | POA: Diagnosis not present

## 2021-02-08 DIAGNOSIS — K529 Noninfective gastroenteritis and colitis, unspecified: Secondary | ICD-10-CM | POA: Diagnosis not present

## 2021-02-08 DIAGNOSIS — Z981 Arthrodesis status: Secondary | ICD-10-CM | POA: Diagnosis not present

## 2021-02-08 LAB — CBC WITH DIFFERENTIAL/PLATELET
Abs Immature Granulocytes: 0.02 10*3/uL (ref 0.00–0.07)
Basophils Absolute: 0.1 10*3/uL (ref 0.0–0.1)
Basophils Relative: 1 %
Eosinophils Absolute: 0.3 10*3/uL (ref 0.0–0.5)
Eosinophils Relative: 4 %
HCT: 36.2 % (ref 36.0–46.0)
Hemoglobin: 12 g/dL (ref 12.0–15.0)
Immature Granulocytes: 0 %
Lymphocytes Relative: 24 %
Lymphs Abs: 1.9 10*3/uL (ref 0.7–4.0)
MCH: 31.3 pg (ref 26.0–34.0)
MCHC: 33.1 g/dL (ref 30.0–36.0)
MCV: 94.3 fL (ref 80.0–100.0)
Monocytes Absolute: 0.9 10*3/uL (ref 0.1–1.0)
Monocytes Relative: 12 %
Neutro Abs: 4.5 10*3/uL (ref 1.7–7.7)
Neutrophils Relative %: 59 %
Platelets: 247 10*3/uL (ref 150–400)
RBC: 3.84 MIL/uL — ABNORMAL LOW (ref 3.87–5.11)
RDW: 13.6 % (ref 11.5–15.5)
WBC: 7.6 10*3/uL (ref 4.0–10.5)
nRBC: 0 % (ref 0.0–0.2)

## 2021-02-08 LAB — HEMOGLOBIN A1C
Hgb A1c MFr Bld: 5.9 % — ABNORMAL HIGH (ref 4.8–5.6)
Mean Plasma Glucose: 122.63 mg/dL

## 2021-02-08 LAB — COMPREHENSIVE METABOLIC PANEL
ALT: 13 U/L (ref 0–44)
AST: 14 U/L — ABNORMAL LOW (ref 15–41)
Albumin: 3.4 g/dL — ABNORMAL LOW (ref 3.5–5.0)
Alkaline Phosphatase: 67 U/L (ref 38–126)
Anion gap: 12 (ref 5–15)
BUN: 9 mg/dL (ref 8–23)
CO2: 25 mmol/L (ref 22–32)
Calcium: 8.2 mg/dL — ABNORMAL LOW (ref 8.9–10.3)
Chloride: 103 mmol/L (ref 98–111)
Creatinine, Ser: 0.49 mg/dL (ref 0.44–1.00)
GFR, Estimated: >60 mL/min (ref >60)
Glucose, Bld: 105 mg/dL — ABNORMAL HIGH (ref 70–99)
Potassium: 2.8 mmol/L — ABNORMAL LOW (ref 3.5–5.1)
Sodium: 140 mmol/L (ref 135–145)
Total Bilirubin: 0.8 mg/dL (ref 0.3–1.2)
Total Protein: 6.5 g/dL (ref 6.5–8.1)

## 2021-02-08 LAB — C DIFFICILE QUICK SCREEN W PCR REFLEX
C Diff antigen: NEGATIVE
C Diff interpretation: NOT DETECTED
C Diff toxin: NEGATIVE

## 2021-02-08 LAB — GLUCOSE, CAPILLARY
Glucose-Capillary: 104 mg/dL — ABNORMAL HIGH (ref 70–99)
Glucose-Capillary: 96 mg/dL (ref 70–99)
Glucose-Capillary: 99 mg/dL (ref 70–99)
Glucose-Capillary: 87 mg/dL (ref 70–99)

## 2021-02-08 LAB — MAGNESIUM: Magnesium: 2 mg/dL (ref 1.7–2.4)

## 2021-02-08 LAB — SURGICAL PCR SCREEN
MRSA, PCR: NEGATIVE
Staphylococcus aureus: NEGATIVE

## 2021-02-08 LAB — HIV ANTIBODY (ROUTINE TESTING W REFLEX): HIV Screen 4th Generation wRfx: NONREACTIVE

## 2021-02-08 LAB — POTASSIUM: Potassium: 3.3 mmol/L — ABNORMAL LOW (ref 3.5–5.1)

## 2021-02-08 MED ORDER — CHLORHEXIDINE GLUCONATE CLOTH 2 % EX PADS
6.0000 | MEDICATED_PAD | Freq: Once | CUTANEOUS | Status: AC
  Administered 2021-02-08: 6 via TOPICAL

## 2021-02-08 MED ORDER — SODIUM CHLORIDE 0.9 % IV SOLN
2.0000 g | INTRAVENOUS | Status: AC
  Administered 2021-02-09: 2 g via INTRAVENOUS
  Filled 2021-02-08 (×2): qty 2

## 2021-02-08 MED ORDER — POTASSIUM CHLORIDE 10 MEQ/100ML IV SOLN
10.0000 meq | INTRAVENOUS | Status: AC
  Administered 2021-02-08 (×2): 10 meq via INTRAVENOUS
  Filled 2021-02-08 (×2): qty 100

## 2021-02-08 MED ORDER — POTASSIUM CHLORIDE 10 MEQ/100ML IV SOLN
10.0000 meq | INTRAVENOUS | Status: AC
Start: 2021-02-08 — End: 2021-02-08
  Administered 2021-02-08 (×5): 10 meq via INTRAVENOUS
  Filled 2021-02-08 (×2): qty 100

## 2021-02-08 NOTE — H&P (View-Only) (Signed)
[place]  Reason for Consult: Small bowel inflammation, possible perforation  Referring Physician:  Dr. Bonner Puna     HPI: Monique Weiss is a 65 y.o. female with anxiety ,rheumatoid arthritis, HTN, DM who has been having abdominal pain for about 1 month with bloating and some nausea. She has been having stools that are loose then regular. She had 1 day when she had a fever subjectively. She says that the pain has been intermittent and that it is ache in nature and she feels bloated. She says that she has to have a BM when she eats. She has no prior history of IBD and has never had a colonoscopy. She had a CT 8/9 that demonstrated a thickened segment of small bowel and then repeat CT yesterday showed continued thickening, no obstruction and likely a foci of air from a contained perforation. She is hemodynamically stable. She has been on Augmentin and has not taken her immunologic in 2 weeks.   She did have a loose tooth around the time her abdominal pain started and chipped tooth. She denies eating fish or using toothpicks.   Past Medical History:  Diagnosis Date   Anxiety    Arthritis    Complication of anesthesia    Hard to wake up   Diabetes mellitus without complication (HCC)    Type II   Hyperlipidemia    Hypertension    Hypothyroidism    PONV (postoperative nausea and vomiting)    Thyroid disease    Hypo    Past Surgical History:  Procedure Laterality Date   ANTERIOR CERVICAL CORPECTOMY N/A 07/27/2018   Procedure: Cervical five Anterior cervical corpectomy with Cervical six-seven Anterior cervical discectomy and fusion;  Surgeon: Judith Part, MD;  Location: [facility];  Service: Neurosurgery;  Laterality: N/A;   BACK SURGERY      fusions x3   BELPHAROPTOSIS REPAIR     Bilateral   CHOLECYSTECTOMY N/A 01/14/2018   Procedure: LAPAROSCOPIC CHOLECYSTECTOMY;  Surgeon: Virl Cagey, MD;  Location: [facility];  Service: General;  Laterality: N/A;    DILATION AND CURETTAGE OF UTERUS     EYE SURGERY     JOINT REPLACEMENT     REPLACEMENT TOTAL KNEE     SHOULDER SURGERY      Family History  Problem Relation Age of Onset   Heart disease Mother    Diabetes Mother    Hypertension Mother    Heart failure Mother    Heart attack Father    Prostate cancer Father    Heart disease Sister    Hypertension Sister    Diabetes Sister    Heart attack Brother    Heart disease Brother    Hypertension Brother    Diabetes Brother    Hypertension Son    Diabetes Brother    Diabetes Brother    Hypertension Sister     Social History   Tobacco Use   Smoking status: Never   Smokeless tobacco: Never  Vaping Use   Vaping Use: Never used  Substance Use Topics   Alcohol use: Never   Drug use: Never    Medications: I have reviewed the patient's current medications. Prior to Admission:  Medications Prior to Admission  Medication Sig Dispense Refill Last Dose   Acetaminophen 325 MG CAPS Take 650 mg by mouth every 6 (six) hours as needed.   02/07/2021   amoxicillin (AMOXIL) 500 MG capsule Take 2,000 mg by mouth once.   Past Month  atorvastatin (LIPITOR) 40 MG tablet Take 40 mg by mouth at bedtime.   11 02/06/2021   cyclobenzaprine (FLEXERIL) 10 MG tablet Take 10 mg by mouth 3 (three) times daily as needed for muscle spasms.    unknown   furosemide (LASIX) 40 MG tablet Take 80 mg by mouth daily.   02/07/2021   gabapentin (NEURONTIN) 300 MG capsule Take 300 mg by mouth daily.   Past Month   KEVZARA 200 MG/1.14ML SOAJ 200 mg every 14 (fourteen) days.   Past Month   levothyroxine (SYNTHROID) 88 MCG tablet Take 88 mcg by mouth daily.   02/07/2021   linaclotide (LINZESS) 145 MCG CAPS capsule Take 145 mcg by mouth daily as needed (constipation).   unknown   metFORMIN (GLUCOPHAGE) 1000 MG tablet Take 1,000 mg by mouth 2 (two) times daily with a meal.   3 02/07/2021   pantoprazole (PROTONIX) 40 MG tablet Take 40 mg by mouth 2 (two) times daily.   02/06/2021    promethazine (PHENERGAN) 12.5 MG tablet Take 12.5-25 mg by mouth every 6 (six) hours as needed for nausea or vomiting.   0 unknown   sertraline (ZOLOFT) 100 MG tablet Take 100 mg by mouth daily.   02/07/2021   spironolactone (ALDACTONE) 25 MG tablet Take 25 mg by mouth every other day.   02/05/2021   amoxicillin-clavulanate (AUGMENTIN) 875-125 MG tablet Take 1 tablet by mouth 2 (two) times daily. (Patient not taking: No sig reported)   Not Taking   clobetasol cream (TEMOVATE) [id number] % Apply 1 application topically 2 (two) times daily. (Patient not taking: No sig reported) 30 g 2 Not Taking   docusate sodium (COLACE) 100 MG capsule Take 1 capsule (100 mg total) by mouth daily as needed. (Patient not taking: No sig reported) 30 capsule 2 Not Taking   hydroxychloroquine (PLAQUENIL) 200 MG tablet Take 200 mg by mouth 2 (two) times daily. (Patient not taking: No sig reported)   Not Taking   ibuprofen (ADVIL) 800 MG tablet Take 800 mg by mouth 3 (three) times daily as needed. (Patient not taking: No sig reported)   Not Taking   levothyroxine (SYNTHROID, LEVOTHROID) 75 MCG tablet Take 75 mcg by mouth daily before breakfast.  (Patient not taking: Reported on 02/07/2021)  12 Not Taking   sertraline (ZOLOFT) 50 MG tablet Take 100 mg by mouth daily. (Patient not taking: Reported on 02/07/2021)  6 Not Taking   Scheduled:  atorvastatin  40 mg Oral QHS   gabapentin  300 mg Oral Daily   insulin aspart  0-15 Units Subcutaneous TID AC & HS   levothyroxine  88 mcg Oral Daily   pantoprazole  40 mg Oral BID   sertraline  100 mg Oral Daily   Continuous:  0.9 % NaCl with KCl 20 mEq / L 100 mL/hr at 02/07/21 2251   piperacillin-tazobactam (ZOSYN)  IV 3.375 g (02/08/21 0748)   potassium chloride     [id number] **OR** acetaminophen, cyclobenzaprine, ketorolac, ondansetron **OR** ondansetron (ZOFRAN) IV  No Known Allergies   ROS:  A comprehensive review of systems was negative except for: Gastrointestinal:  positive for abdominal pain, diarrhea, nausea, and loating  Blood pressure 118/69, pulse 66, temperature 98.1 F (36.7 C), temperature source Oral, resp. rate 18, height '5\' 8"'$  (1.727 m), weight 99.8 kg, SpO2 98 %. Physical Exam Vitals reviewed.  Constitutional:      Appearance: Normal appearance.  HENT:     Head: Normocephalic.     Nose: Nose normal.  Mouth/Throat:     Mouth: Mucous membranes are moist.  Eyes:     Pupils: Pupils are equal, round, and reactive to light.  Cardiovascular:     Rate and Rhythm: Normal rate.  Pulmonary:     Effort: Pulmonary effort is normal.  Abdominal:     General: There is distension.     Palpations: Abdomen is soft.     Tenderness: There is abdominal tenderness. There is no guarding or rebound.  Musculoskeletal:        General: No swelling.     Cervical back: Normal range of motion.  Skin:    General: Skin is warm.  Neurological:     General: No focal deficit present.     Mental Status: She is alert and oriented to person, place, and time.  Psychiatric:        Mood and Affect: Mood normal.        Behavior: Behavior normal.        Thought Content: Thought content normal.        Judgment: Judgment normal.    Results: Results for orders placed or performed during the hospital encounter of 02/07/21 (from the past 48 hour(s))  Comprehensive metabolic panel     Status: Abnormal   Collection Time: 02/07/21 12:03 PM  Result Value Ref Range   Sodium 138 135 - 145 mmol/L   Potassium 3.4 (L) 3.5 - 5.1 mmol/L   Chloride 104 98 - 111 mmol/L   CO2 28 22 - 32 mmol/L   Glucose, Bld 97 70 - 99 mg/dL    Comment: Glucose reference range applies only to samples taken after fasting for at least 8 hours.   BUN 12 8 - 23 mg/dL   Creatinine, Ser 0.51 0.44 - 1.00 mg/dL   Calcium 8.4 (L) 8.9 - 10.3 mg/dL   Total Protein 7.5 6.5 - 8.1 g/dL   Albumin 3.9 3.5 - 5.0 g/dL   AST 22 15 - 41 U/L   ALT 17 0 - 44 U/L   Alkaline Phosphatase 77 38 - 126 U/L    Total Bilirubin 0.5 0.3 - 1.2 mg/dL   GFR, Estimated >60 >60 mL/min    Comment: (NOTE) Calculated using the CKD-EPI Creatinine Equation (2021)    Anion gap 6 5 - 15    Comment: Performed at [facility], [address] [place], [place] [address]  Lipase, blood     Status: None   Collection Time: 02/07/21 12:03 PM  Result Value Ref Range   Lipase 28 11 - 51 U/L    Comment: Performed at [facility], [address], [place], [place] [address]  CBC with Differential     Status: None   Collection Time: 02/07/21 12:03 PM  Result Value Ref Range   WBC 9.5 4.0 - 10.5 K/uL   RBC 4.10 3.87 - 5.11 MIL/uL   Hemoglobin 12.7 12.0 - 15.0 g/dL   HCT 38.7 36.0 - 46.0 %   MCV 94.4 80.0 - 100.0 fL   MCH 31.0 26.0 - 34.0 pg   MCHC 32.8 30.0 - 36.0 g/dL   RDW 13.5 11.5 - 15.5 %   Platelets 296 150 - 400 K/uL   nRBC 0.0 0.0 - 0.2 %   Neutrophils Relative % 67 %   Neutro Abs 6.5 1.7 - 7.7 K/uL   Lymphocytes Relative 19 %   Lymphs Abs 1.8 0.7 - 4.0 K/uL   Monocytes Relative 10 %   Monocytes Absolute 0.9 0.1 - 1.0  K/uL   Eosinophils Relative 3 %   Eosinophils Absolute 0.2 0.0 - 0.5 K/uL   Basophils Relative 1 %   Basophils Absolute 0.1 0.0 - 0.1 K/uL   Immature Granulocytes 0 %   Abs Immature Granulocytes 0.03 0.00 - 0.07 K/uL    Comment: Performed at [facility], [address] [place], [place] [address]  Resp Panel by RT-PCR (Flu A&B, Covid) Nasopharyngeal Swab     Status: None   Collection Time: 02/07/21  5:28 PM   Specimen: Nasopharyngeal Swab; Nasopharyngeal(NP) swabs in vial transport medium  Result Value Ref Range   SARS Coronavirus 2 by RT PCR NEGATIVE NEGATIVE    Comment: (NOTE) SARS-CoV-2 target nucleic acids are NOT DETECTED.  The SARS-CoV-2 RNA is generally detectable in upper respiratory specimens during the acute phase of infection. The lowest concentration of SARS-CoV-2 viral copies this assay can detect is 138 copies/mL. A negative result does not preclude  SARS-Cov-2 infection and should not be used as the sole basis for treatment or other patient management decisions. A negative result may occur with  improper specimen collection/handling, submission of specimen other than nasopharyngeal swab, presence of viral mutation(s) within the areas targeted by this assay, and inadequate number of viral copies(<138 copies/mL). A negative result must be combined with clinical observations, patient history, and epidemiological information. The expected result is Negative.  Fact Sheet for Patients:  EntrepreneurPulse.com.au  Fact Sheet for Healthcare Providers:  IncredibleEmployment.be  This test is no t yet approved or cleared by the [place] FDA and  has been authorized for detection and/or diagnosis of SARS-CoV-2 by FDA under an Emergency Use Authorization (EUA). This EUA will remain  in effect (meaning this test can be used) for the duration of the COVID-19 declaration under Section 564(b)(1) of the Act, 21 U.S.C.section 360bbb-3(b)(1), unless the authorization is terminated  or revoked sooner.       Influenza A by PCR NEGATIVE NEGATIVE   Influenza B by PCR NEGATIVE NEGATIVE    Comment: (NOTE) The Xpert Xpress SARS-CoV-2/FLU/RSV plus assay is intended as an aid in the diagnosis of influenza from Nasopharyngeal swab specimens and should not be used as a sole basis for treatment. Nasal washings and aspirates are unacceptable for Xpert Xpress SARS-CoV-2/FLU/RSV testing.  Fact Sheet for Patients: EntrepreneurPulse.com.au  Fact Sheet for Healthcare Providers: IncredibleEmployment.be  This test is not yet approved or cleared by the [place] FDA and has been authorized for detection and/or diagnosis of SARS-CoV-2 by FDA under an Emergency Use Authorization (EUA). This EUA will remain in effect (meaning this test can be used) for the duration of the COVID-19  declaration under Section 564(b)(1) of the Act, 21 U.S.C. section 360bbb-3(b)(1), unless the authorization is terminated or revoked.  Performed at [facility], [address], [place], [place] [address]   Hemoglobin A1c     Status: Abnormal   Collection Time: 02/07/21 10:36 PM  Result Value Ref Range   Hgb A1c MFr Bld 5.9 (H) 4.8 - 5.6 %    Comment: (NOTE) Pre diabetes:          5.7%-6.4%  Diabetes:              >6.4%  Glycemic control for   <7.0% adults with diabetes    Mean Plasma Glucose 122.63 mg/dL    Comment: Performed at [facility] [address], [place], [place] [address]  C-reactive protein     Status: Abnormal   Collection Time: 02/07/21 10:36 PM  Result Value Ref Range   CRP 2.5 (H) <1.0 mg/dL    Comment: Performed at [facility], [address] [place], [place] [address]  Lactic acid, plasma     Status: None   Collection Time: 02/07/21 10:36 PM  Result Value Ref Range   Lactic Acid, Venous 0.9 0.5 - 1.9 mmol/L    Comment: Performed at [facility], [address] [place], [place] [address]  Comprehensive metabolic panel     Status: Abnormal   Collection Time: 02/08/21  5:38 AM  Result Value Ref Range   Sodium 140 135 - 145 mmol/L   Potassium 2.8 (L) 3.5 - 5.1 mmol/L    Comment: DELTA CHECK NOTED   Chloride 103 98 - 111 mmol/L   CO2 25 22 - 32 mmol/L   Glucose, Bld 105 (H) 70 - 99 mg/dL    Comment: Glucose reference range applies only to samples taken after fasting for at least 8 hours.   BUN 9 8 - 23 mg/dL   Creatinine, Ser 0.49 0.44 - 1.00 mg/dL   Calcium 8.2 (L) 8.9 - 10.3 mg/dL   Total Protein 6.5 6.5 - 8.1 g/dL   Albumin 3.4 (L) 3.5 - 5.0 g/dL   AST 14 (L) 15 - 41 U/L   ALT 13 0 - 44 U/L   Alkaline Phosphatase 67 38 - 126 U/L   Total Bilirubin 0.8 0.3 - 1.2 mg/dL   GFR, Estimated >60 >60 mL/min    Comment: (NOTE) Calculated using the CKD-EPI Creatinine Equation (2021)    Anion gap 12 5 - 15    Comment: Performed at [facility],  [address], [place], [place] [address]  Magnesium     Status: None   Collection Time: 02/08/21  5:38 AM  Result Value Ref Range   Magnesium 2.0 1.7 - 2.4 mg/dL    Comment: Performed at [facility], [address], [place], [place] [address]  CBC WITH DIFFERENTIAL     Status: Abnormal   Collection Time: 02/08/21  5:38 AM  Result Value Ref Range   WBC 7.6 4.0 - 10.5 K/uL   RBC 3.84 (L) 3.87 - 5.11 MIL/uL   Hemoglobin 12.0 12.0 - 15.0 g/dL   HCT 36.2 36.0 - 46.0 %   MCV 94.3 80.0 - 100.0 fL   MCH 31.3 26.0 - 34.0 pg   MCHC 33.1 30.0 - 36.0 g/dL   RDW 13.6 11.5 - 15.5 %   Platelets 247 150 - 400 K/uL   nRBC 0.0 0.0 - 0.2 %   Neutrophils Relative % 59 %   Neutro Abs 4.5 1.7 - 7.7 K/uL   Lymphocytes Relative 24 %   Lymphs Abs 1.9 0.7 - 4.0 K/uL   Monocytes Relative 12 %   Monocytes Absolute 0.9 0.1 - 1.0 K/uL   Eosinophils Relative 4 %   Eosinophils Absolute 0.3 0.0 - 0.5 K/uL   Basophils Relative 1 %   Basophils Absolute 0.1 0.0 - 0.1 K/uL   Immature Granulocytes 0 %   Abs Immature Granulocytes 0.02 0.00 - 0.07 K/uL    Comment: Performed at [facility], [address], [place], [place] [address]  Glucose, capillary     Status: Abnormal   Collection Time: 02/08/21  7:48 AM  Result Value Ref Range   Glucose-Capillary 104 (H) 70 - 99 mg/dL    Comment: Glucose reference range applies only to samples taken after fasting for at least 8 hours.   Personally reviewed CT from 8/9  and 8/17 and reviewed with Dr. Thornton Papas, thickened loop of bowel, could definitely be Meckel's, small bowel enteritis with perforation, foreign body or neoplasm CT ABDOMEN PELVIS W CONTRAST  Result Date: 02/07/2021 CLINICAL DATA:  Abdominal pain and swelling.  Diarrhea. EXAM: CT ABDOMEN AND PELVIS WITH CONTRAST TECHNIQUE: Multidetector CT imaging of the abdomen and pelvis was performed using the standard protocol following bolus administration of intravenous contrast. CONTRAST:  33m OMNIPAQUE IOHEXOL 350 MG/ML SOLN  COMPARISON:  01/30/2021. FINDINGS: Lower chest: Stable scarring in the lingula. Hepatobiliary: No suspicious focal abnormality within the liver parenchyma. Gallbladder surgically absent. No intrahepatic or extrahepatic biliary dilation. Pancreas: No focal mass lesion. No dilatation of the main duct. No intraparenchymal cyst. No peripancreatic edema. Spleen: No splenomegaly. No focal mass lesion. Adrenals/Urinary Tract: No adrenal nodule or mass. Kidneys unremarkable. No evidence for hydroureter. The urinary bladder appears normal for the degree of distention. Stomach/Bowel: Stomach is decompressed. Duodenum is normally positioned as is the ligament of Treitz. Duodenal diverticulum noted. Small bowel loops in the upper right pelvis again show wall thickening with mesenteric congestion and edema. Small cluster of tiny gas bubbles identified on image 51 of series 2 appears to be but is not definitely extraluminal. The previously described small lymph nodes in this region are similar. The small bowel wall in this region is ill-defined suggesting perienteric edema. The terminal ileum is normal. The appendix is not well visualized, but there is no edema or inflammation in the region of the cecum. No gross colonic mass. No colonic wall thickening. Diverticular changes are noted in the left colon without evidence of diverticulitis. Large stool volume evident. Vascular/Lymphatic: No abdominal aortic aneurysm. 11 mm aortocaval node on 34/2 is mildly enlarged. As noted above, clustered increased number of nodes identified in the small bowel mesentery of the upper pelvis, in the region of the abnormal small bowel loops. No pelvic sidewall lymphadenopathy. Reproductive: Unremarkable. Other: No intraperitoneal free fluid. No gross intraperitoneal free air. Musculoskeletal: Tiny umbilical hernia contains only fat. No worrisome lytic or sclerotic osseous abnormality. Multilevel lumbar fusion evident. IMPRESSION: 1. Similar  appearance of abnormal small bowel loops in the upper central and right pelvis with wall thickening, mesenteric congestion, and edema. Small cluster of tiny gas bubbles identified on today's study appears to be but is not definitely extraluminal. This new finding of clustered tiny gas bubbles suggests contained micro perforation. There is no intraperitoneal free fluid or gross intraperitoneal free gas. Small-bowel abnormality may be related to infectious/inflammatory enteritis with perforation or foreign body perforation. Small bowel neoplasm not excluded. No discrete or drainable abscess present at this time. Repeat CT imaging with oral and intravenous contrast may prove helpful to further evaluate. 2. Similar appearance of clustered increased number of nodes in the small bowel mesentery of the upper pelvis, similar to prior with mild retroperitoneal lymphadenopathy as before. 3. Large stool volume. Imaging features could be compatible with clinical constipation. 4. Left colonic diverticulosis without diverticulitis. 5. Aortic Atherosclerosis (ICD10-I70.0). Electronically Signed   By: EMisty StanleyM.D.   On: 02/07/2021 15:19     Assessment & Plan:  CNALAYA PETTAWAYis a 65y.o. female with small bowel thickening that is of unknown etiology could be from a meckel's with perforation or from foreign body or neoplasm. Spoke with the patient about options. We could do antibiotics and repeat imaging again but I cannot guarantee that this will improve and that it will not stricture and that she will not need surgery regardless.  Discussed surgery to confirm the diagnosis and to resect the segment if indicated. Discussed risk of bleeding, infection, anastomotic leak, risk of finding cancer or IBD.   -K low, replacements ordered ,will repeat K after replacements  - Consent for OR tomorrow   All questions were answered to the satisfaction of the patient.Virl Cagey 02/08/2021, 10:47 AM

## 2021-02-08 NOTE — Consult Note (Signed)
[facility]  Reason for Consult: Small bowel inflammation, possible perforation  Referring Physician:  Dr. Bonner Puna     HPI: Monique Weiss is a 65 y.o. female with anxiety ,rheumatoid arthritis, HTN, DM who has been having abdominal pain for about 1 month with bloating and some nausea. She has been having stools that are loose then regular. She had 1 day when she had a fever subjectively. She says that the pain has been intermittent and that it is ache in nature and she feels bloated. She says that she has to have a BM when she eats. She has no prior history of IBD and has never had a colonoscopy. She had a CT 8/9 that demonstrated a thickened segment of small bowel and then repeat CT yesterday showed continued thickening, no obstruction and likely a foci of air from a contained perforation. She is hemodynamically stable. She has been on Augmentin and has not taken her immunologic in 2 weeks.   She did have a loose tooth around the time her abdominal pain started and chipped tooth. She denies eating fish or using toothpicks.   Past Medical History:  Diagnosis Date   Anxiety    Arthritis    Complication of anesthesia    Hard to wake up   Diabetes mellitus without complication (HCC)    Type II   Hyperlipidemia    Hypertension    Hypothyroidism    PONV (postoperative nausea and vomiting)    Thyroid disease    Hypo    Past Surgical History:  Procedure Laterality Date   ANTERIOR CERVICAL CORPECTOMY N/A 07/27/2018   Procedure: Cervical five Anterior cervical corpectomy with Cervical six-seven Anterior cervical discectomy and fusion;  Surgeon: Judith Part, MD;  Location: [facility];  Service: Neurosurgery;  Laterality: N/A;   BACK SURGERY      fusions x3   BELPHAROPTOSIS REPAIR     Bilateral   CHOLECYSTECTOMY N/A 01/14/2018   Procedure: LAPAROSCOPIC CHOLECYSTECTOMY;  Surgeon: Virl Cagey, MD;  Location: [facility];  Service: General;  Laterality: N/A;    DILATION AND CURETTAGE OF UTERUS     EYE SURGERY     JOINT REPLACEMENT     REPLACEMENT TOTAL KNEE     SHOULDER SURGERY      Family History  Problem Relation Age of Onset   Heart disease Mother    Diabetes Mother    Hypertension Mother    Heart failure Mother    Heart attack Father    Prostate cancer Father    Heart disease Sister    Hypertension Sister    Diabetes Sister    Heart attack Brother    Heart disease Brother    Hypertension Brother    Diabetes Brother    Hypertension Son    Diabetes Brother    Diabetes Brother    Hypertension Sister     Social History   Tobacco Use   Smoking status: Never   Smokeless tobacco: Never  Vaping Use   Vaping Use: Never used  Substance Use Topics   Alcohol use: Never   Drug use: Never    Medications: I have reviewed the patient's current medications. Prior to Admission:  Medications Prior to Admission  Medication Sig Dispense Refill Last Dose   Acetaminophen 325 MG CAPS Take 650 mg by mouth every 6 (six) hours as needed.   02/07/2021   amoxicillin (AMOXIL) 500 MG capsule Take 2,000 mg by mouth once.   Past Month  atorvastatin (LIPITOR) 40 MG tablet Take 40 mg by mouth at bedtime.   11 02/06/2021   cyclobenzaprine (FLEXERIL) 10 MG tablet Take 10 mg by mouth 3 (three) times daily as needed for muscle spasms.    unknown   furosemide (LASIX) 40 MG tablet Take 80 mg by mouth daily.   02/07/2021   gabapentin (NEURONTIN) 300 MG capsule Take 300 mg by mouth daily.   Past Month   KEVZARA 200 MG/1.14ML SOAJ 200 mg every 14 (fourteen) days.   Past Month   levothyroxine (SYNTHROID) 88 MCG tablet Take 88 mcg by mouth daily.   02/07/2021   linaclotide (LINZESS) 145 MCG CAPS capsule Take 145 mcg by mouth daily as needed (constipation).   unknown   metFORMIN (GLUCOPHAGE) 1000 MG tablet Take 1,000 mg by mouth 2 (two) times daily with a meal.   3 02/07/2021   pantoprazole (PROTONIX) 40 MG tablet Take 40 mg by mouth 2 (two) times daily.   02/06/2021    promethazine (PHENERGAN) 12.5 MG tablet Take 12.5-25 mg by mouth every 6 (six) hours as needed for nausea or vomiting.   0 unknown   sertraline (ZOLOFT) 100 MG tablet Take 100 mg by mouth daily.   02/07/2021   spironolactone (ALDACTONE) 25 MG tablet Take 25 mg by mouth every other day.   02/05/2021   amoxicillin-clavulanate (AUGMENTIN) 875-125 MG tablet Take 1 tablet by mouth 2 (two) times daily. (Patient not taking: No sig reported)   Not Taking   clobetasol cream (TEMOVATE) [id number] % Apply 1 application topically 2 (two) times daily. (Patient not taking: No sig reported) 30 g 2 Not Taking   docusate sodium (COLACE) 100 MG capsule Take 1 capsule (100 mg total) by mouth daily as needed. (Patient not taking: No sig reported) 30 capsule 2 Not Taking   hydroxychloroquine (PLAQUENIL) 200 MG tablet Take 200 mg by mouth 2 (two) times daily. (Patient not taking: No sig reported)   Not Taking   ibuprofen (ADVIL) 800 MG tablet Take 800 mg by mouth 3 (three) times daily as needed. (Patient not taking: No sig reported)   Not Taking   levothyroxine (SYNTHROID, LEVOTHROID) 75 MCG tablet Take 75 mcg by mouth daily before breakfast.  (Patient not taking: Reported on 02/07/2021)  12 Not Taking   sertraline (ZOLOFT) 50 MG tablet Take 100 mg by mouth daily. (Patient not taking: Reported on 02/07/2021)  6 Not Taking   Scheduled:  atorvastatin  40 mg Oral QHS   gabapentin  300 mg Oral Daily   insulin aspart  0-15 Units Subcutaneous TID AC & HS   levothyroxine  88 mcg Oral Daily   pantoprazole  40 mg Oral BID   sertraline  100 mg Oral Daily   Continuous:  0.9 % NaCl with KCl 20 mEq / L 100 mL/hr at 02/07/21 2251   piperacillin-tazobactam (ZOSYN)  IV 3.375 g (02/08/21 0748)   potassium chloride     [id number] **OR** acetaminophen, cyclobenzaprine, ketorolac, ondansetron **OR** ondansetron (ZOFRAN) IV  No Known Allergies   ROS:  A comprehensive review of systems was negative except for: Gastrointestinal:  positive for abdominal pain, diarrhea, nausea, and loating  Blood pressure 118/69, pulse 66, temperature 98.1 F (36.7 C), temperature source Oral, resp. rate 18, height '5\' 8"'$  (1.727 m), weight 99.8 kg, SpO2 98 %. Physical Exam Vitals reviewed.  Constitutional:      Appearance: Normal appearance.  HENT:     Head: Normocephalic.     Nose: Nose normal.  Mouth/Throat:     Mouth: Mucous membranes are moist.  Eyes:     Pupils: Pupils are equal, round, and reactive to light.  Cardiovascular:     Rate and Rhythm: Normal rate.  Pulmonary:     Effort: Pulmonary effort is normal.  Abdominal:     General: There is distension.     Palpations: Abdomen is soft.     Tenderness: There is abdominal tenderness. There is no guarding or rebound.  Musculoskeletal:        General: No swelling.     Cervical back: Normal range of motion.  Skin:    General: Skin is warm.  Neurological:     General: No focal deficit present.     Mental Status: She is alert and oriented to person, place, and time.  Psychiatric:        Mood and Affect: Mood normal.        Behavior: Behavior normal.        Thought Content: Thought content normal.        Judgment: Judgment normal.    Results: Results for orders placed or performed during the hospital encounter of 02/07/21 (from the past 48 hour(s))  Comprehensive metabolic panel     Status: Abnormal   Collection Time: 02/07/21 12:03 PM  Result Value Ref Range   Sodium 138 135 - 145 mmol/L   Potassium 3.4 (L) 3.5 - 5.1 mmol/L   Chloride 104 98 - 111 mmol/L   CO2 28 22 - 32 mmol/L   Glucose, Bld 97 70 - 99 mg/dL    Comment: Glucose reference range applies only to samples taken after fasting for at least 8 hours.   BUN 12 8 - 23 mg/dL   Creatinine, Ser 0.51 0.44 - 1.00 mg/dL   Calcium 8.4 (L) 8.9 - 10.3 mg/dL   Total Protein 7.5 6.5 - 8.1 g/dL   Albumin 3.9 3.5 - 5.0 g/dL   AST 22 15 - 41 U/L   ALT 17 0 - 44 U/L   Alkaline Phosphatase 77 38 - 126 U/L    Total Bilirubin 0.5 0.3 - 1.2 mg/dL   GFR, Estimated >60 >60 mL/min    Comment: (NOTE) Calculated using the CKD-EPI Creatinine Equation (2021)    Anion gap 6 5 - 15    Comment: Performed at [facility], [address] [place], [place] [address]  Lipase, blood     Status: None   Collection Time: 02/07/21 12:03 PM  Result Value Ref Range   Lipase 28 11 - 51 U/L    Comment: Performed at [facility], [address] [place], [place] [address]  CBC with Differential     Status: None   Collection Time: 02/07/21 12:03 PM  Result Value Ref Range   WBC 9.5 4.0 - 10.5 K/uL   RBC 4.10 3.87 - 5.11 MIL/uL   Hemoglobin 12.7 12.0 - 15.0 g/dL   HCT 38.7 36.0 - 46.0 %   MCV 94.4 80.0 - 100.0 fL   MCH 31.0 26.0 - 34.0 pg   MCHC 32.8 30.0 - 36.0 g/dL   RDW 13.5 11.5 - 15.5 %   Platelets 296 150 - 400 K/uL   nRBC 0.0 0.0 - 0.2 %   Neutrophils Relative % 67 %   Neutro Abs 6.5 1.7 - 7.7 K/uL   Lymphocytes Relative 19 %   Lymphs Abs 1.8 0.7 - 4.0 K/uL   Monocytes Relative 10 %   Monocytes Absolute 0.9 0.1 - 1.0  K/uL   Eosinophils Relative 3 %   Eosinophils Absolute 0.2 0.0 - 0.5 K/uL   Basophils Relative 1 %   Basophils Absolute 0.1 0.0 - 0.1 K/uL   Immature Granulocytes 0 %   Abs Immature Granulocytes 0.03 0.00 - 0.07 K/uL    Comment: Performed at [facility], [address], [place], [place] [address]  Resp Panel by RT-PCR (Flu A&B, Covid) Nasopharyngeal Swab     Status: None   Collection Time: 02/07/21  5:28 PM   Specimen: Nasopharyngeal Swab; Nasopharyngeal(NP) swabs in vial transport medium  Result Value Ref Range   SARS Coronavirus 2 by RT PCR NEGATIVE NEGATIVE    Comment: (NOTE) SARS-CoV-2 target nucleic acids are NOT DETECTED.  The SARS-CoV-2 RNA is generally detectable in upper respiratory specimens during the acute phase of infection. The lowest concentration of SARS-CoV-2 viral copies this assay can detect is 138 copies/mL. A negative result does not preclude  SARS-Cov-2 infection and should not be used as the sole basis for treatment or other patient management decisions. A negative result may occur with  improper specimen collection/handling, submission of specimen other than nasopharyngeal swab, presence of viral mutation(s) within the areas targeted by this assay, and inadequate number of viral copies(<138 copies/mL). A negative result must be combined with clinical observations, patient history, and epidemiological information. The expected result is Negative.  Fact Sheet for Patients:  EntrepreneurPulse.com.au  Fact Sheet for Healthcare Providers:  IncredibleEmployment.be  This test is no t yet approved or cleared by the [place] FDA and  has been authorized for detection and/or diagnosis of SARS-CoV-2 by FDA under an Emergency Use Authorization (EUA). This EUA will remain  in effect (meaning this test can be used) for the duration of the COVID-19 declaration under Section 564(b)(1) of the Act, 21 U.S.C.section 360bbb-3(b)(1), unless the authorization is terminated  or revoked sooner.       Influenza A by PCR NEGATIVE NEGATIVE   Influenza B by PCR NEGATIVE NEGATIVE    Comment: (NOTE) The Xpert Xpress SARS-CoV-2/FLU/RSV plus assay is intended as an aid in the diagnosis of influenza from Nasopharyngeal swab specimens and should not be used as a sole basis for treatment. Nasal washings and aspirates are unacceptable for Xpert Xpress SARS-CoV-2/FLU/RSV testing.  Fact Sheet for Patients: EntrepreneurPulse.com.au  Fact Sheet for Healthcare Providers: IncredibleEmployment.be  This test is not yet approved or cleared by the [place] FDA and has been authorized for detection and/or diagnosis of SARS-CoV-2 by FDA under an Emergency Use Authorization (EUA). This EUA will remain in effect (meaning this test can be used) for the duration of the COVID-19  declaration under Section 564(b)(1) of the Act, 21 U.S.C. section 360bbb-3(b)(1), unless the authorization is terminated or revoked.  Performed at [facility], [address] [place], [place] [address]   Hemoglobin A1c     Status: Abnormal   Collection Time: 02/07/21 10:36 PM  Result Value Ref Range   Hgb A1c MFr Bld 5.9 (H) 4.8 - 5.6 %    Comment: (NOTE) Pre diabetes:          5.7%-6.4%  Diabetes:              >6.4%  Glycemic control for   <7.0% adults with diabetes    Mean Plasma Glucose 122.63 mg/dL    Comment: Performed at [facility] [address] [place],  [address]  C-reactive protein     Status: Abnormal   Collection Time: 02/07/21 10:36 PM  Result Value Ref Range   CRP 2.5 (H) <1.0 mg/dL    Comment: Performed at [facility], [address] [place], [place] [address]  Lactic acid, plasma     Status: None   Collection Time: 02/07/21 10:36 PM  Result Value Ref Range   Lactic Acid, Venous 0.9 0.5 - 1.9 mmol/L    Comment: Performed at [facility], [address], [place], [place] [address]  Comprehensive metabolic panel     Status: Abnormal   Collection Time: 02/08/21  5:38 AM  Result Value Ref Range   Sodium 140 135 - 145 mmol/L   Potassium 2.8 (L) 3.5 - 5.1 mmol/L    Comment: DELTA CHECK NOTED   Chloride 103 98 - 111 mmol/L   CO2 25 22 - 32 mmol/L   Glucose, Bld 105 (H) 70 - 99 mg/dL    Comment: Glucose reference range applies only to samples taken after fasting for at least 8 hours.   BUN 9 8 - 23 mg/dL   Creatinine, Ser 0.49 0.44 - 1.00 mg/dL   Calcium 8.2 (L) 8.9 - 10.3 mg/dL   Total Protein 6.5 6.5 - 8.1 g/dL   Albumin 3.4 (L) 3.5 - 5.0 g/dL   AST 14 (L) 15 - 41 U/L   ALT 13 0 - 44 U/L   Alkaline Phosphatase 67 38 - 126 U/L   Total Bilirubin 0.8 0.3 - 1.2 mg/dL   GFR, Estimated >60 >60 mL/min    Comment: (NOTE) Calculated using the CKD-EPI Creatinine Equation (2021)    Anion gap 12 5 - 15    Comment: Performed at [facility],  [address], [place], [place] [address]  Magnesium     Status: None   Collection Time: 02/08/21  5:38 AM  Result Value Ref Range   Magnesium 2.0 1.7 - 2.4 mg/dL    Comment: Performed at [facility], [address], [place], [place] [address]  CBC WITH DIFFERENTIAL     Status: Abnormal   Collection Time: 02/08/21  5:38 AM  Result Value Ref Range   WBC 7.6 4.0 - 10.5 K/uL   RBC 3.84 (L) 3.87 - 5.11 MIL/uL   Hemoglobin 12.0 12.0 - 15.0 g/dL   HCT 36.2 36.0 - 46.0 %   MCV 94.3 80.0 - 100.0 fL   MCH 31.3 26.0 - 34.0 pg   MCHC 33.1 30.0 - 36.0 g/dL   RDW 13.6 11.5 - 15.5 %   Platelets 247 150 - 400 K/uL   nRBC 0.0 0.0 - 0.2 %   Neutrophils Relative % 59 %   Neutro Abs 4.5 1.7 - 7.7 K/uL   Lymphocytes Relative 24 %   Lymphs Abs 1.9 0.7 - 4.0 K/uL   Monocytes Relative 12 %   Monocytes Absolute 0.9 0.1 - 1.0 K/uL   Eosinophils Relative 4 %   Eosinophils Absolute 0.3 0.0 - 0.5 K/uL   Basophils Relative 1 %   Basophils Absolute 0.1 0.0 - 0.1 K/uL   Immature Granulocytes 0 %   Abs Immature Granulocytes 0.02 0.00 - 0.07 K/uL    Comment: Performed at [facility], [address] [place], [place] [address]  Glucose, capillary     Status: Abnormal   Collection Time: 02/08/21  7:48 AM  Result Value Ref Range   Glucose-Capillary 104 (H) 70 - 99 mg/dL    Comment: Glucose reference range applies only to samples taken after fasting for at least 8 hours.   Personally reviewed CT from 8/9  and 8/17 and reviewed with Dr. [place] Papas, thickened loop of bowel, could definitely be Meckel's, small bowel enteritis with perforation, foreign body or neoplasm CT ABDOMEN PELVIS W CONTRAST  Result Date: 02/07/2021 CLINICAL DATA:  Abdominal pain and swelling.  Diarrhea. EXAM: CT ABDOMEN AND PELVIS WITH CONTRAST TECHNIQUE: Multidetector CT imaging of the abdomen and pelvis was performed using the standard protocol following bolus administration of intravenous contrast. CONTRAST:  70m OMNIPAQUE IOHEXOL 350 MG/ML SOLN  COMPARISON:  01/30/2021. FINDINGS: Lower chest: Stable scarring in the lingula. Hepatobiliary: No suspicious focal abnormality within the liver parenchyma. Gallbladder surgically absent. No intrahepatic or extrahepatic biliary dilation. Pancreas: No focal mass lesion. No dilatation of the main duct. No intraparenchymal cyst. No peripancreatic edema. Spleen: No splenomegaly. No focal mass lesion. Adrenals/Urinary Tract: No adrenal nodule or mass. Kidneys unremarkable. No evidence for hydroureter. The urinary bladder appears normal for the degree of distention. Stomach/Bowel: Stomach is decompressed. Duodenum is normally positioned as is the ligament of Treitz. Duodenal diverticulum noted. Small bowel loops in the upper right pelvis again show wall thickening with mesenteric congestion and edema. Small cluster of tiny gas bubbles identified on image 51 of series 2 appears to be but is not definitely extraluminal. The previously described small lymph nodes in this region are similar. The small bowel wall in this region is ill-defined suggesting perienteric edema. The terminal ileum is normal. The appendix is not well visualized, but there is no edema or inflammation in the region of the cecum. No gross colonic mass. No colonic wall thickening. Diverticular changes are noted in the left colon without evidence of diverticulitis. Large stool volume evident. Vascular/Lymphatic: No abdominal aortic aneurysm. 11 mm aortocaval node on 34/2 is mildly enlarged. As noted above, clustered increased number of nodes identified in the small bowel mesentery of the upper pelvis, in the region of the abnormal small bowel loops. No pelvic sidewall lymphadenopathy. Reproductive: Unremarkable. Other: No intraperitoneal free fluid. No gross intraperitoneal free air. Musculoskeletal: Tiny umbilical hernia contains only fat. No worrisome lytic or sclerotic osseous abnormality. Multilevel lumbar fusion evident. IMPRESSION: 1. Similar  appearance of abnormal small bowel loops in the upper central and right pelvis with wall thickening, mesenteric congestion, and edema. Small cluster of tiny gas bubbles identified on today's study appears to be but is not definitely extraluminal. This new finding of clustered tiny gas bubbles suggests contained micro perforation. There is no intraperitoneal free fluid or gross intraperitoneal free gas. Small-bowel abnormality may be related to infectious/inflammatory enteritis with perforation or foreign body perforation. Small bowel neoplasm not excluded. No discrete or drainable abscess present at this time. Repeat CT imaging with oral and intravenous contrast may prove helpful to further evaluate. 2. Similar appearance of clustered increased number of nodes in the small bowel mesentery of the upper pelvis, similar to prior with mild retroperitoneal lymphadenopathy as before. 3. Large stool volume. Imaging features could be compatible with clinical constipation. 4. Left colonic diverticulosis without diverticulitis. 5. Aortic Atherosclerosis (ICD10-I70.0). Electronically Signed   By: EMisty StanleyM.D.   On: 02/07/2021 15:19     Assessment & Plan:  CEDELL DELEONis a 65y.o. female with small bowel thickening that is of unknown etiology could be from a meckel's with perforation or from foreign body or neoplasm. Spoke with the patient about options. We could do antibiotics and repeat imaging again but I cannot guarantee that this will improve and that it will not stricture and that she will not need surgery regardless.  Discussed surgery to confirm the diagnosis and to resect the segment if indicated. Discussed risk of bleeding, infection, anastomotic leak, risk of finding cancer or IBD.   -K low, replacements ordered ,will repeat K after replacements  - Consent for OR tomorrow   All questions were answered to the satisfaction of the patient.Virl Cagey 02/08/2021, 10:47 AM

## 2021-02-08 NOTE — Progress Notes (Signed)
**Note De-identified  Obfuscation** EKG completed and placed in patient chart 

## 2021-02-08 NOTE — Progress Notes (Signed)
PROGRESS NOTE  Monique Weiss  [id number] DOB: 11-May-1956 DOA: 02/07/2021 PCP: Asencion Noble, MD   Brief Narrative: 65 year old female with past medical history of large ventral hernia, hyperlipidemia, hypothyroidism, gastroesophageal reflux disease, diabetes mellitus type 2, obstructive sleep apnea (not on CPAP) and rheumatoid arthritis presenting to [facility] emergency department at the direction of her primary care provider for abdominal pain.  Patient explains that approximately 6 or 7 weeks ago she began to experience diarrhea.  Diarrhea is watery and nonbloody.  Patient explains that she has been having diarrhea upwards of 7 times daily.  Diarrhea continue to persist over the following 2 weeks and approximately 4 weeks ago patient began to experience abdominal pain.  Patient describes abdominal pain as periumbilical in location, moderate to severe in intensity and sharp in quality.  Pain was worse with movement without any alleviating factors.  Patient denies any associated nausea or vomiting.  Patient's pain persisted for several days and she presented to a local urgent care clinic on 7/13 for evaluation.  During that urgent care clinic visit abdominal x-ray was performed and was found to be unremarkable.  Due to the severity of the patient's abdominal pain patient was instructed to go to the emergency department but decided not to do so.   Patient's pain continued to persist and 1 to 2 weeks later she eventually presented to see her her primary care provider Dr. Willey Blade who placed her on a course of amoxicillin for suspected enteritis.  Patient took this antibiotic for approximately 7 days without any improvement in symptoms and at that point patient was sent for CT imaging of the abdomen and pelvis on 8/9.  This revealed a focal inflammatory process associated with the small bowel loops with wall thickening and mesenteric inflammation.  After Dr. Willey Blade discussed the case with Rourk with  gastroenterology decision was made to place patient on a course of Augmentin and monitor for response.  Once again, course of antibiotics did not improve patient's ongoing abdominal pain and diarrhea and patient presented once again to her primary care provider's office.  After a repeat discussion with Dr. Gala Romney with gastroenterology patient was instructed to go to the emergency department for further evaluation.   Upon evaluation in the emergency department repeat CT imaging of the abdomen and pelvis was performed.  This once again redemonstrated abnormal small bowel loops with wall thickening and mesenteric congestion and edema however this time there is concern for clustering of gas bubbles identified that appeared to possibly be extraluminal concerning for microperforation.  After discussion with Dr. Gala Romney with gastroenterology, ER provider then discussed case with Dr. Constance Haw with general surgery who recommended intravenous antibiotics, to make the patient n.p.o. after midnight and that surgery would formally consult on the patient in the morning.  The hospitalist group was then called to assess patient for admission to the hospital.  Assessment & Plan: Principal Problem:   Abdominal pain Active Problems:   Rheumatoid arthritis ([place])   Enteritis   Mixed diabetic hyperlipidemia associated with type 2 diabetes mellitus ([place])   Type 2 diabetes mellitus without complication, without long-term current use of insulin ([facility])   GERD without esophagitis   Hypothyroidism   Acute diarrhea   Small bowel perforation ([facility])   Abdominal pain with concern for contained perforation due to enteritis vs. foreign body vs. Meckel's diverticulum vs. IBD vs. neoplasm.  - Surgery consulted, recommending preparation for exploratory surgery 8/19 with possible resection of affected segment. NPO.  -  Continue zosyn empirically especially with immunosuppressed status.   Diarrhea: States this occurred contemporaneously  with abdominal pain which preceded antibiotic treatments.  - Stool studies pending. Enteric precautions.  Hypokalemia:  - Supplemented by IV runs x5 with appropriate rise 2.8 > 3.3, though was also receiving 28mq/L in MIVF. Will repeat 2 runs of K, continue IVF as ordered, and recheck in AM. Mg was wnl at 2.   Incomplete RBBB: Stable on repeat ECG on 02/08/2021 from prior in 2020. No ischemic features. No recent or current anginal complaints or symptoms attributable to heart failure. No further work up is currently planned.   Rheumatoid arthritis:  - Continue holding sarilumab (IL-6 inhibitor) which has not been taken for 2 weeks.    T2DM: Glucose at goal without intervention.  - Continue monitoring CBGs   HLD:  - Continue statin   GERD:  - Continue PPI  Hypothyroidism:  - Continue synthroid.   Depression: Quiescent.  - Continue SSRI.  Obesity: Estimated body mass index is 33.45 kg/m as calculated from the following:   Height as of this encounter: '5\' 8"'$  (1.727 m).   Weight as of this encounter: 99.8 kg.  DVT prophylaxis: SCDs pending surgical management Code Status: Full Family Communication: Son and DIL at bedside Disposition Plan:  Status is: Inpatient  Remains inpatient appropriate because:Ongoing diagnostic testing needed not appropriate for outpatient work up and Inpatient level of care appropriate due to severity of illness  Dispo: The patient is from: Home              Anticipated d/c is to: Home              Patient currently is not medically stable to d/c.   Difficult to place patient No  Consultants:  Surgery  Procedures:  TBD  Antimicrobials: Zosyn   Subjective: Feels abdominal pain is stable, difficult to describe, constant waxing/waning, mod-severe. No chest pain or dyspnea.   Objective: Vitals:   02/07/21 2112 02/08/21 0206 02/08/21 0556 02/08/21 1527  BP: 140/73 (!) 146/70 118/69 (!) 147/86  Pulse: 64 66 66 64  Resp: '19 18  18  '$ Temp: 98.2  F (36.8 C) 98.4 F (36.9 C) 98.1 F (36.7 C) 97.7 F (36.5 C)  TempSrc: Oral Oral Oral Oral  SpO2: 96% 98% 98% 98%  Weight:      Height:        Intake/Output Summary (Last 24 hours) at 02/08/2021 1845 Last data filed at 02/08/2021 0531 Gross per 24 hour  Intake 302.16 ml  Output --  Net 302.16 ml   Filed Weights   02/07/21 1151  Weight: 99.8 kg    Gen: 65y.o. female in no distress Pulm: Non-labored breathing room air. Clear to auscultation bilaterally.  CV: Regular rate and rhythm. No murmur, rub, or gallop. No JVD, no pitting pedal edema. GI: Abdomen soft, distended and tender diffusely without rebound. +bowel sounds. No organomegaly or masses felt. Ext: Warm, no deformities Skin: No rashes, lesions or ulcers on visualized skin. Neuro: Alert and oriented. No focal neurological deficits. Psych: Judgement and insight appear normal. Mood & affect appropriate.   Data Reviewed: I have personally reviewed following labs and imaging studies  CBC: Recent Labs  Lab 02/07/21 1203 02/08/21 0538  WBC 9.5 7.6  NEUTROABS 6.5 4.5  HGB 12.7 12.0  HCT 38.7 36.2  MCV 94.4 94.3  PLT 296 [id number]  Basic Metabolic Panel: Recent Labs  Lab 02/07/21 1203 02/08/21 0538 02/08/21 1741  NA  138 140  --   K 3.4* 2.8* 3.3*  CL 104 103  --   CO2 28 25  --   GLUCOSE 97 105*  --   BUN 12 9  --   CREATININE 0.51 0.49  --   CALCIUM 8.4* 8.2*  --   MG  --  2.0  --    GFR: Estimated Creatinine Clearance: 84.3 mL/min (by C-G formula based on SCr of 0.49 mg/dL). Liver Function Tests: Recent Labs  Lab 02/07/21 1203 02/08/21 0538  AST 22 14*  ALT 17 13  ALKPHOS 77 67  BILITOT 0.5 0.8  PROT 7.5 6.5  ALBUMIN 3.9 3.4*   Recent Labs  Lab 02/07/21 1203  LIPASE 28   No results for input(s): AMMONIA in the last 168 hours. Coagulation Profile: No results for input(s): INR, PROTIME in the last 168 hours. Cardiac Enzymes: No results for input(s): CKTOTAL, CKMB, CKMBINDEX, TROPONINI in  the last 168 hours. BNP (last 3 results) No results for input(s): PROBNP in the last 8760 hours. HbA1C: Recent Labs    02/07/21 2236  HGBA1C 5.9*   CBG: Recent Labs  Lab 02/08/21 0748 02/08/21 1141 02/08/21 1637  GLUCAP 104* 96 99   Lipid Profile: No results for input(s): CHOL, HDL, LDLCALC, TRIG, CHOLHDL, LDLDIRECT in the last 72 hours. Thyroid Function Tests: No results for input(s): TSH, T4TOTAL, FREET4, T3FREE, THYROIDAB in the last 72 hours. Anemia Panel: No results for input(s): VITAMINB12, FOLATE, FERRITIN, TIBC, IRON, RETICCTPCT in the last 72 hours. Urine analysis:    Component Value Date/Time   COLORURINE AMBER (A) 01/12/2018 1217   APPEARANCEUR Cloudy (A) 04/06/2020 1635   LABSPEC 1.014 01/12/2018 1217   PHURINE 5.0 01/12/2018 1217   GLUCOSEU Negative 04/06/2020 1635   GLUCOSEU Negative 04/06/2020 1507   HGBUR NEGATIVE 01/12/2018 1217   BILIRUBINUR Negative 04/06/2020 1635   KETONESUR NEGATIVE 01/12/2018 1217   PROTEINUR Negative 04/06/2020 1635   PROTEINUR 30 (A) 01/12/2018 1217   NITRITE Positive (A) 04/06/2020 1635   NITRITE pos 04/06/2020 1507   NITRITE NEGATIVE 01/12/2018 1217   LEUKOCYTESUR 3+ (A) 04/06/2020 1635   LEUKOCYTESUR Large (3+) (A) 04/06/2020 1507   Recent Results (from the past 240 hour(s))  Resp Panel by RT-PCR (Flu A&B, Covid) Nasopharyngeal Swab     Status: None   Collection Time: 02/07/21  5:28 PM   Specimen: Nasopharyngeal Swab; Nasopharyngeal(NP) swabs in vial transport medium  Result Value Ref Range Status   SARS Coronavirus 2 by RT PCR NEGATIVE NEGATIVE Final    Comment: (NOTE) SARS-CoV-2 target nucleic acids are NOT DETECTED.  The SARS-CoV-2 RNA is generally detectable in upper respiratory specimens during the acute phase of infection. The lowest concentration of SARS-CoV-2 viral copies this assay can detect is 138 copies/mL. A negative result does not preclude SARS-Cov-2 infection and should not be used as the sole basis  for treatment or other patient management decisions. A negative result may occur with  improper specimen collection/handling, submission of specimen other than nasopharyngeal swab, presence of viral mutation(s) within the areas targeted by this assay, and inadequate number of viral copies(<138 copies/mL). A negative result must be combined with clinical observations, patient history, and epidemiological information. The expected result is Negative.  Fact Sheet for Patients:  EntrepreneurPulse.com.au  Fact Sheet for Healthcare Providers:  IncredibleEmployment.be  This test is no t yet approved or cleared by the [place] FDA and  has been authorized for detection and/or diagnosis of SARS-CoV-2 by FDA under an  Emergency Use Authorization (EUA). This EUA will remain  in effect (meaning this test can be used) for the duration of the COVID-19 declaration under Section 564(b)(1) of the Act, 21 U.S.C.section 360bbb-3(b)(1), unless the authorization is terminated  or revoked sooner.       Influenza A by PCR NEGATIVE NEGATIVE Final   Influenza B by PCR NEGATIVE NEGATIVE Final    Comment: (NOTE) The Xpert Xpress SARS-CoV-2/FLU/RSV plus assay is intended as an aid in the diagnosis of influenza from Nasopharyngeal swab specimens and should not be used as a sole basis for treatment. Nasal washings and aspirates are unacceptable for Xpert Xpress SARS-CoV-2/FLU/RSV testing.  Fact Sheet for Patients: EntrepreneurPulse.com.au  Fact Sheet for Healthcare Providers: IncredibleEmployment.be  This test is not yet approved or cleared by the [place] FDA and has been authorized for detection and/or diagnosis of SARS-CoV-2 by FDA under an Emergency Use Authorization (EUA). This EUA will remain in effect (meaning this test can be used) for the duration of the COVID-19 declaration under Section 564(b)(1) of the Act, 21  U.S.C. section 360bbb-3(b)(1), unless the authorization is terminated or revoked.  Performed at [facility], [address] [place], [place] [address]   C Difficile Quick Screen w PCR reflex     Status: None   Collection Time: 02/08/21 10:20 AM   Specimen: STOOL  Result Value Ref Range Status   C Diff antigen NEGATIVE NEGATIVE Final   C Diff toxin NEGATIVE NEGATIVE Final   C Diff interpretation No C. difficile detected.  Final    Comment: Performed at [facility], [address], [place], [place] [address]  Surgical pcr screen     Status: None   Collection Time: 02/08/21 12:03 PM   Specimen: Nasal Mucosa; Nasal Swab  Result Value Ref Range Status   MRSA, PCR NEGATIVE NEGATIVE Final   Staphylococcus aureus NEGATIVE NEGATIVE Final    Comment: (NOTE) The Xpert SA Assay (FDA approved for NASAL specimens in patients 26 years of age and older), is one component of a comprehensive surveillance program. It is not intended to diagnose infection nor to guide or monitor treatment. Performed at [facility], [address], [place], [place] [address]       Radiology Studies: CT ABDOMEN PELVIS W CONTRAST  Result Date: 02/07/2021 CLINICAL DATA:  Abdominal pain and swelling.  Diarrhea. EXAM: CT ABDOMEN AND PELVIS WITH CONTRAST TECHNIQUE: Multidetector CT imaging of the abdomen and pelvis was performed using the standard protocol following bolus administration of intravenous contrast. CONTRAST:  61m OMNIPAQUE IOHEXOL 350 MG/ML SOLN COMPARISON:  01/30/2021. FINDINGS: Lower chest: Stable scarring in the lingula. Hepatobiliary: No suspicious focal abnormality within the liver parenchyma. Gallbladder surgically absent. No intrahepatic or extrahepatic biliary dilation. Pancreas: No focal mass lesion. No dilatation of the main duct. No intraparenchymal cyst. No peripancreatic edema. Spleen: No splenomegaly. No focal mass lesion. Adrenals/Urinary Tract: No adrenal nodule or mass. Kidneys unremarkable. No  evidence for hydroureter. The urinary bladder appears normal for the degree of distention. Stomach/Bowel: Stomach is decompressed. Duodenum is normally positioned as is the ligament of Treitz. Duodenal diverticulum noted. Small bowel loops in the upper right pelvis again show wall thickening with mesenteric congestion and edema. Small cluster of tiny gas bubbles identified on image 51 of series 2 appears to be but is not definitely extraluminal. The previously described small lymph nodes in this region are similar. The small bowel wall in this region is ill-defined suggesting perienteric edema. The terminal ileum is normal. The  appendix is not well visualized, but there is no edema or inflammation in the region of the cecum. No gross colonic mass. No colonic wall thickening. Diverticular changes are noted in the left colon without evidence of diverticulitis. Large stool volume evident. Vascular/Lymphatic: No abdominal aortic aneurysm. 11 mm aortocaval node on 34/2 is mildly enlarged. As noted above, clustered increased number of nodes identified in the small bowel mesentery of the upper pelvis, in the region of the abnormal small bowel loops. No pelvic sidewall lymphadenopathy. Reproductive: Unremarkable. Other: No intraperitoneal free fluid. No gross intraperitoneal free air. Musculoskeletal: Tiny umbilical hernia contains only fat. No worrisome lytic or sclerotic osseous abnormality. Multilevel lumbar fusion evident. IMPRESSION: 1. Similar appearance of abnormal small bowel loops in the upper central and right pelvis with wall thickening, mesenteric congestion, and edema. Small cluster of tiny gas bubbles identified on today's study appears to be but is not definitely extraluminal. This new finding of clustered tiny gas bubbles suggests contained micro perforation. There is no intraperitoneal free fluid or gross intraperitoneal free gas. Small-bowel abnormality may be related to infectious/inflammatory enteritis  with perforation or foreign body perforation. Small bowel neoplasm not excluded. No discrete or drainable abscess present at this time. Repeat CT imaging with oral and intravenous contrast may prove helpful to further evaluate. 2. Similar appearance of clustered increased number of nodes in the small bowel mesentery of the upper pelvis, similar to prior with mild retroperitoneal lymphadenopathy as before. 3. Large stool volume. Imaging features could be compatible with clinical constipation. 4. Left colonic diverticulosis without diverticulitis. 5. Aortic Atherosclerosis (ICD10-I70.0). Electronically Signed   By: Misty Stanley M.D.   On: 02/07/2021 15:19    Scheduled Meds:  atorvastatin  40 mg Oral QHS   Chlorhexidine Gluconate Cloth  6 each Topical Once   gabapentin  300 mg Oral Daily   insulin aspart  0-15 Units Subcutaneous TID AC & HS   levothyroxine  88 mcg Oral Daily   pantoprazole  40 mg Oral BID   sertraline  100 mg Oral Daily   Continuous Infusions:  0.9 % NaCl with KCl 20 mEq / L 100 mL/hr at 02/08/21 1353   [START ON 02/09/2021] cefoTEtan (CEFOTAN) IV     piperacillin-tazobactam (ZOSYN)  IV 3.375 g (02/08/21 1513)     LOS: 0 days   Time spent: 25 minutes.  Patrecia Pour, MD [facility] [email or web address] 02/08/2021, 6:45 PM

## 2021-02-09 ENCOUNTER — Encounter (HOSPITAL_COMMUNITY)
Admission: EM | Disposition: A | Discharge status: Home / Self Care | Payer: Self-pay | Source: Home / Self Care | Attending: Family Medicine

## 2021-02-09 ENCOUNTER — Inpatient Hospital Stay (HOSPITAL_COMMUNITY): Payer: Medicare Other | Admitting: Anesthesiology

## 2021-02-09 HISTORY — PX: LAPAROTOMY: SHX154

## 2021-02-09 LAB — GASTROINTESTINAL PANEL BY PCR, STOOL (REPLACES STOOL CULTURE)

## 2021-02-09 LAB — COMPREHENSIVE METABOLIC PANEL
ALT: 13 U/L (ref 0–44)
AST: 13 U/L — ABNORMAL LOW (ref 15–41)
Albumin: 3.3 g/dL — ABNORMAL LOW (ref 3.5–5.0)
Alkaline Phosphatase: 62 U/L (ref 38–126)
Anion gap: 6 (ref 5–15)
BUN: 7 mg/dL — ABNORMAL LOW (ref 8–23)
CO2: 29 mmol/L (ref 22–32)
Calcium: 8.7 mg/dL — ABNORMAL LOW (ref 8.9–10.3)
Chloride: 106 mmol/L (ref 98–111)
Creatinine, Ser: 0.43 mg/dL — ABNORMAL LOW (ref 0.44–1.00)
GFR, Estimated: >60 mL/min (ref >60)
Glucose, Bld: 95 mg/dL (ref 70–99)
Potassium: 3.5 mmol/L (ref 3.5–5.1)
Sodium: 141 mmol/L (ref 135–145)
Total Bilirubin: 0.8 mg/dL (ref 0.3–1.2)
Total Protein: 6.2 g/dL — ABNORMAL LOW (ref 6.5–8.1)

## 2021-02-09 LAB — GLUCOSE, CAPILLARY
Glucose-Capillary: 94 mg/dL (ref 70–99)
Glucose-Capillary: 94 mg/dL (ref 70–99)
Glucose-Capillary: 104 mg/dL — ABNORMAL HIGH (ref 70–99)
Glucose-Capillary: 132 mg/dL — ABNORMAL HIGH (ref 70–99)
Glucose-Capillary: 138 mg/dL — ABNORMAL HIGH (ref 70–99)
Glucose-Capillary: 204 mg/dL — ABNORMAL HIGH (ref 70–99)

## 2021-02-09 LAB — CBC
HCT: 37.3 % (ref 36.0–46.0)
Hemoglobin: 12.1 g/dL (ref 12.0–15.0)
MCH: 30.6 pg (ref 26.0–34.0)
MCHC: 32.4 g/dL (ref 30.0–36.0)
MCV: 94.4 fL (ref 80.0–100.0)
Platelets: 233 10*3/uL (ref 150–400)
RBC: 3.95 MIL/uL (ref 3.87–5.11)
RDW: 13.3 % (ref 11.5–15.5)
WBC: 4.7 10*3/uL (ref 4.0–10.5)
nRBC: 0 % (ref 0.0–0.2)

## 2021-02-09 LAB — PROTIME-INR
INR: 1 (ref 0.8–1.2)
Prothrombin Time: 13.6 seconds (ref 11.4–15.2)

## 2021-02-09 SURGERY — LAPAROTOMY, EXPLORATORY
Anesthesia: General

## 2021-02-09 MED ORDER — PROPOFOL 10 MG/ML IV BOLUS
INTRAVENOUS | Status: AC
  Filled 2021-02-09: qty 20

## 2021-02-09 MED ORDER — LIDOCAINE HCL (CARDIAC) PF 100 MG/5ML IV SOSY
PREFILLED_SYRINGE | INTRAVENOUS | Status: DC | PRN
  Administered 2021-02-09: 100 mg via INTRATRACHEAL

## 2021-02-09 MED ORDER — BUPIVACAINE LIPOSOME 1.3 % IJ SUSP
INTRAMUSCULAR | Status: AC
  Filled 2021-02-09: qty 20

## 2021-02-09 MED ORDER — ROCURONIUM BROMIDE 100 MG/10ML IV SOLN
INTRAVENOUS | Status: DC | PRN
  Administered 2021-02-09: 60 mg via INTRAVENOUS

## 2021-02-09 MED ORDER — OXYCODONE HCL 5 MG PO TABS: 5.0000 mg | ORAL_TABLET | ORAL | Status: DC | PRN

## 2021-02-09 MED ORDER — SUCCINYLCHOLINE CHLORIDE 200 MG/10ML IV SOSY
PREFILLED_SYRINGE | INTRAVENOUS | Status: DC | PRN
  Administered 2021-02-09: 200 mg via INTRAVENOUS

## 2021-02-09 MED ORDER — DEXAMETHASONE SODIUM PHOSPHATE 10 MG/ML IJ SOLN
INTRAMUSCULAR | Status: AC
  Filled 2021-02-09: qty 1

## 2021-02-09 MED ORDER — ONDANSETRON HCL 4 MG/2ML IJ SOLN
INTRAMUSCULAR | Status: DC | PRN
  Administered 2021-02-09: 4 mg via INTRAVENOUS

## 2021-02-09 MED ORDER — MIDAZOLAM HCL 2 MG/2ML IJ SOLN
INTRAMUSCULAR | Status: AC
  Filled 2021-02-09: qty 2

## 2021-02-09 MED ORDER — SCOPOLAMINE 1 MG/3DAYS TD PT72
1.0000 | MEDICATED_PATCH | Freq: Once | TRANSDERMAL | Status: DC
  Administered 2021-02-09: 1.5 mg via TRANSDERMAL

## 2021-02-09 MED ORDER — ROCURONIUM BROMIDE 10 MG/ML (PF) SYRINGE
PREFILLED_SYRINGE | INTRAVENOUS | Status: AC
  Filled 2021-02-09: qty 10

## 2021-02-09 MED ORDER — BUPIVACAINE LIPOSOME 1.3 % IJ SUSP
INTRAMUSCULAR | Status: DC | PRN
  Administered 2021-02-09: 20 mL

## 2021-02-09 MED ORDER — MIDAZOLAM HCL 5 MG/5ML IJ SOLN
INTRAMUSCULAR | Status: DC | PRN
  Administered 2021-02-09: 2 mg via INTRAVENOUS

## 2021-02-09 MED ORDER — PHENYLEPHRINE HCL (PRESSORS) 10 MG/ML IV SOLN
INTRAVENOUS | Status: DC | PRN
  Administered 2021-02-09: 80 ug via INTRAVENOUS

## 2021-02-09 MED ORDER — CHLORHEXIDINE GLUCONATE 0.12 % MT SOLN
OROMUCOSAL | Status: AC
  Filled 2021-02-09: qty 15

## 2021-02-09 MED ORDER — PHENYLEPHRINE 40 MCG/ML (10ML) SYRINGE FOR IV PUSH (FOR BLOOD PRESSURE SUPPORT)
PREFILLED_SYRINGE | INTRAVENOUS | Status: AC
  Filled 2021-02-09: qty 10

## 2021-02-09 MED ORDER — ORAL CARE MOUTH RINSE: 15.0000 mL | Freq: Once | OROMUCOSAL | Status: DC

## 2021-02-09 MED ORDER — SCOPOLAMINE 1 MG/3DAYS TD PT72
MEDICATED_PATCH | TRANSDERMAL | Status: AC
  Filled 2021-02-09: qty 1

## 2021-02-09 MED ORDER — DEXAMETHASONE SODIUM PHOSPHATE 4 MG/ML IJ SOLN
INTRAMUSCULAR | Status: DC | PRN
  Administered 2021-02-09: 4 mg via INTRAVENOUS

## 2021-02-09 MED ORDER — BOOST / RESOURCE BREEZE PO LIQD CUSTOM
1.0000 | Freq: Three times a day (TID) | ORAL | Status: DC
  Administered 2021-02-10: 1 via ORAL

## 2021-02-09 MED ORDER — METOCLOPRAMIDE HCL 5 MG/ML IJ SOLN
INTRAMUSCULAR | Status: AC
  Filled 2021-02-09: qty 2

## 2021-02-09 MED ORDER — FENTANYL CITRATE (PF) 250 MCG/5ML IJ SOLN
INTRAMUSCULAR | Status: AC
  Filled 2021-02-09: qty 5

## 2021-02-09 MED ORDER — ONDANSETRON HCL 4 MG/2ML IJ SOLN
INTRAMUSCULAR | Status: AC
  Filled 2021-02-09: qty 2

## 2021-02-09 MED ORDER — PROPOFOL 10 MG/ML IV BOLUS
INTRAVENOUS | Status: DC | PRN
  Administered 2021-02-09: 160 mg via INTRAVENOUS

## 2021-02-09 MED ORDER — PROMETHAZINE HCL 25 MG/ML IJ SOLN: 6.2500 mg | INTRAMUSCULAR | Status: DC | PRN

## 2021-02-09 MED ORDER — METOCLOPRAMIDE HCL 5 MG/ML IJ SOLN
INTRAMUSCULAR | Status: DC | PRN
  Administered 2021-02-09: 10 mg via INTRAVENOUS

## 2021-02-09 MED ORDER — SODIUM CHLORIDE 0.9 % IR SOLN
Status: DC | PRN
  Administered 2021-02-09 (×2): 1000 mL

## 2021-02-09 MED ORDER — LIDOCAINE HCL (PF) 2 % IJ SOLN
INTRAMUSCULAR | Status: AC
  Filled 2021-02-09: qty 5

## 2021-02-09 MED ORDER — FENTANYL CITRATE (PF) 100 MCG/2ML IJ SOLN
INTRAMUSCULAR | Status: DC | PRN
  Administered 2021-02-09 (×4): 50 ug via INTRAVENOUS
  Administered 2021-02-09: 150 ug via INTRAVENOUS

## 2021-02-09 MED ORDER — LOPERAMIDE HCL 2 MG PO CAPS: 2.0000 mg | ORAL_CAPSULE | ORAL | Status: DC | PRN

## 2021-02-09 MED ORDER — FENTANYL CITRATE (PF) 100 MCG/2ML IJ SOLN
INTRAMUSCULAR | Status: AC
  Filled 2021-02-09: qty 2

## 2021-02-09 MED ORDER — LACTATED RINGERS IV SOLN: INTRAVENOUS | Status: DC

## 2021-02-09 MED ORDER — CHLORHEXIDINE GLUCONATE 0.12 % MT SOLN: 15.0000 mL | Freq: Once | OROMUCOSAL | Status: DC

## 2021-02-09 MED ORDER — SUCCINYLCHOLINE CHLORIDE 200 MG/10ML IV SOSY
PREFILLED_SYRINGE | INTRAVENOUS | Status: AC
  Filled 2021-02-09: qty 10

## 2021-02-09 MED ORDER — MEPERIDINE HCL 50 MG/ML IJ SOLN: 6.2500 mg | INTRAMUSCULAR | Status: DC | PRN

## 2021-02-09 MED ORDER — HYDROMORPHONE HCL 1 MG/ML IJ SOLN
0.2500 mg | INTRAMUSCULAR | Status: DC | PRN
  Administered 2021-02-09 (×2): 0.5 mg via INTRAVENOUS
  Filled 2021-02-09 (×2): qty 0.5

## 2021-02-09 MED ORDER — SUGAMMADEX SODIUM 200 MG/2ML IV SOLN
INTRAVENOUS | Status: DC | PRN
  Administered 2021-02-09: 200 mg via INTRAVENOUS

## 2021-02-09 SURGICAL SUPPLY — 39 items
APL PRP STRL LF DISP 70% ISPRP (MISCELLANEOUS) ×1
CHLORAPREP W/TINT 26 (MISCELLANEOUS) ×2 IMPLANT
CLOTH BEACON ORANGE TIMEOUT ST (SAFETY) ×2 IMPLANT
COVER LIGHT HANDLE STERIS (MISCELLANEOUS) ×4 IMPLANT
DRAPE WARM FLUID 44X44 (DRAPES) ×2 IMPLANT
DRSG OPSITE POSTOP 4X8 (GAUZE/BANDAGES/DRESSINGS) ×1 IMPLANT
ELECT BLADE 6 FLAT ULTRCLN (ELECTRODE) ×1 IMPLANT
ELECT REM PT RETURN 9FT ADLT (ELECTROSURGICAL) ×2
ELECTRODE REM PT RTRN 9FT ADLT (ELECTROSURGICAL) ×1 IMPLANT
GAUZE 4X4 16PLY ~~LOC~~+RFID DBL (SPONGE) ×1 IMPLANT
GLOVE SURG ENC MOIS LTX SZ6.5 (GLOVE) ×2 IMPLANT
GLOVE SURG POLYISO LF SZ7.5 (GLOVE) ×2 IMPLANT
GLOVE SURG UNDER POLY LF SZ6.5 (GLOVE) ×2 IMPLANT
GLOVE SURG UNDER POLY LF SZ7 (GLOVE) ×4 IMPLANT
GOWN STRL REUS W/TWL LRG LVL3 (GOWN DISPOSABLE) ×6 IMPLANT
HANDLE SUCTION POOLE (INSTRUMENTS) ×1 IMPLANT
INST SET MAJOR GENERAL (KITS) ×2 IMPLANT
KIT TURNOVER KIT A (KITS) ×2 IMPLANT
MANIFOLD NEPTUNE II (INSTRUMENTS) ×2 IMPLANT
NDL HYPO 18GX1.5 BLUNT FILL (NEEDLE) ×1 IMPLANT
NDL HYPO 21X1.5 SAFETY (NEEDLE) ×1 IMPLANT
NEEDLE HYPO 18GX1.5 BLUNT FILL (NEEDLE) ×4 IMPLANT
NEEDLE HYPO 21X1.5 SAFETY (NEEDLE) ×2 IMPLANT
NS IRRIG 1000ML POUR BTL (IV SOLUTION) ×4 IMPLANT
PACK MAJOR ABDOMINAL (CUSTOM PROCEDURE TRAY) ×2 IMPLANT
PAD ARMBOARD 7.5X6 YLW CONV (MISCELLANEOUS) ×2 IMPLANT
PENCIL SMOKE EVACUATOR COATED (MISCELLANEOUS) ×2 IMPLANT
RETRACTOR WOUND ALXS 18CM MED (MISCELLANEOUS) IMPLANT
RTRCTR WOUND ALEXIS O 18CM MED (MISCELLANEOUS) ×2
SEALER TISSUE X1 CVD JAW (INSTRUMENTS) ×1 IMPLANT
SET BASIN LINEN APH (SET/KITS/TRAYS/PACK) ×2 IMPLANT
SPONGE T-LAP 18X18 ~~LOC~~+RFID (SPONGE) ×3 IMPLANT
STAPLER VISISTAT (STAPLE) ×2 IMPLANT
SUCTION POOLE HANDLE (INSTRUMENTS) ×2
SUT PDS AB CT VIOLET #0 27IN (SUTURE) ×4 IMPLANT
SUT SILK 3 0 SH CR/8 (SUTURE) ×2 IMPLANT
SYR 20ML LL LF (SYRINGE) ×4 IMPLANT
SYR 30ML LL (SYRINGE) ×2 IMPLANT
TRAY FOLEY MTR SLVR 16FR STAT (SET/KITS/TRAYS/PACK) ×2 IMPLANT

## 2021-02-09 NOTE — Anesthesia Preprocedure Evaluation (Addendum)
Anesthesia Evaluation  Patient identified by MRN, date of birth, ID band Patient awake    Reviewed: Allergy & Precautions, NPO status , Patient's Chart, lab work & pertinent test results  History of Anesthesia Complications (+) PONV, PROLONGED EMERGENCE and history of anesthetic complications  Airway Mallampati: III  TM Distance: >3 FB Neck ROM: Limited    Dental  (+) Dental Advisory Given, Missing, Chipped   Pulmonary sleep apnea ,    Pulmonary exam normal breath sounds clear to auscultation       Cardiovascular hypertension, Pt. on medications Normal cardiovascular exam Rhythm:Regular Rate:Normal     Neuro/Psych Anxiety  Neuromuscular disease (cervical myelopathy)    GI/Hepatic GERD  Medicated,  Endo/Other  diabetes, Well Controlled, Type 2, Oral Hypoglycemic AgentsHypothyroidism   Renal/GU      Musculoskeletal  (+) Arthritis  (neck fusion), Osteoarthritis and Rheumatoid disorders,    Abdominal   Peds  Hematology   Anesthesia Other Findings   Reproductive/Obstetrics                           Anesthesia Physical Anesthesia Plan  ASA: 3  Anesthesia Plan: General   Post-op Pain Management:    Induction: Intravenous, Rapid sequence and Cricoid pressure planned  PONV Risk Score and Plan: 4 or greater and Ondansetron, Dexamethasone and Scopolamine patch - Pre-op  Airway Management Planned: Oral ETT and Video Laryngoscope Planned  Additional Equipment:   Intra-op Plan:   Post-operative Plan: Extubation in OR and Possible Post-op intubation/ventilation  Informed Consent: I have reviewed the patients History and Physical, chart, labs and discussed the procedure including the risks, benefits and alternatives for the proposed anesthesia with the patient or authorized representative who has indicated his/her understanding and acceptance.     Dental advisory given  Plan Discussed  with: CRNA and Surgeon  Anesthesia Plan Comments:        Anesthesia Quick Evaluation

## 2021-02-09 NOTE — Interval H&P Note (Signed)
History and Physical Interval Note:  02/09/2021 9:47 AM  Monique Weiss  has presented today for surgery, with the diagnosis of small bowel perforation, inflammation, possible meckel's.  The various methods of treatment have been discussed with the patient and family. After consideration of risks, benefits and other options for treatment, the patient has consented to  Procedure(s): EXPLORATORY LAPAROTOMY, possible small bowel resection (N/A) as a surgical intervention.  The patient's history has been reviewed, patient examined, no change in status, stable for surgery.  I have reviewed the patient's chart and labs.  Questions were answered to the patient's satisfaction.    K normalized.  Virl Cagey

## 2021-02-09 NOTE — Progress Notes (Signed)
Initial Nutrition Assessment  DOCUMENTATION CODES:   Obesity unspecified  INTERVENTION:  Boost Breeze po TID, each supplement provides 250 kcal and 9 grams of protein   Follow diet advancement and tolerance  NUTRITION DIAGNOSIS:   Inadequate oral intake related to diarrhea as evidenced by per patient/family report.   GOAL:  Patient will meet greater than or equal to 90% of their needs   MONITOR:  Diet advancement, PO intake, Supplement acceptance, I & O's, Weight trends  REASON FOR ASSESSMENT:   Malnutrition Screening Tool    ASSESSMENT: patient is a 65 yo female with hx of DM2, HTN, Thyroid disease, Arthritis. Presents with complaint of persistent diarrhea the past 6-7 weeks.   Surgery consulted- Patient s/p exploratory laparotomy today - without resection.   Patient diet advanced to clears now. Her last meal was Monday and she's excited to be able to drink now.   Usual weight ranges 96-100 kg x 10 months. No significant change.  Medications reviewed and include: Protonix, insulin, Lipitor.   Labs: BMP Latest Ref Rng & Units 02/09/2021 02/08/2021 02/08/2021  Glucose 70 - 99 mg/dL 95 - 105(H)  BUN 8 - 23 mg/dL 7(L) - 9  Creatinine 0.44 - 1.00 mg/dL 0.43(L) - 0.49  Sodium 135 - 145 mmol/L 141 - 140  Potassium 3.5 - 5.1 mmol/L 3.5 3.3(L) 2.8(L)  Chloride 98 - 111 mmol/L 106 - 103  CO2 22 - 32 mmol/L 29 - 25  Calcium 8.9 - 10.3 mg/dL 8.7(L) - 8.2(L)      NUTRITION - FOCUSED PHYSICAL EXAM: Nutrition-Focused physical exam completed. Findings are no fat depletion, no muscle depletion, and no edema.    Diet Order:   Diet Order             Diet clear liquid Room service appropriate? Yes; Fluid consistency: Thin  Diet effective now                   EDUCATION NEEDS:  Education needs have been addressed  Skin:  Skin Assessment: Reviewed RN Assessment  Last BM:  8/18 abdominal distention and diarrhea per nursing assessment  Height:   Ht Readings from Last  1 Encounters:  02/07/21 '5\' 8"'$  (1.727 m)    Weight:   Wt Readings from Last 1 Encounters:  02/07/21 99.8 kg    Ideal Body Weight:   64 kg  BMI:  Body mass index is 33.45 kg/m.  Estimated Nutritional Needs:   Kcal:  1700-1800  Protein:  90-97 gr  Fluid:  >1800 ml daily   Colman Cater MS,RD,CSG,LDN Contact: through [employer]

## 2021-02-09 NOTE — Care Management Important Message (Signed)
Important Message  Patient Details  Name: Monique Weiss MRN: [id number] Date of Birth: 24-Oct-1955   Medicare Important Message Given:  Yes     Tommy Medal 02/09/2021, 4:25 PM

## 2021-02-09 NOTE — Progress Notes (Signed)
PROGRESS NOTE  Monique Weiss  [id number] DOB: 10/01/1955 DOA: 02/07/2021 PCP: Asencion Noble, MD   Brief Narrative: Monique Weiss is a 65 y.o. female with a history of large ventral hernia, GERD, HLD, T2DM, hypothyroidism, RA on immunosuppression, and OSA not on CPAP who presented to the ED at [facility] 8/17 at the direction of her PCP due to persistent diarrhea and abdominal pain despite rounds of antibiotics and abnormal CT abdomen.   She had been seen at [facility] 7/13 for this where XR was unremarkable, though severity of pain prompted recommendation to go to ED which the patient declined. With ongoing symptoms, her PCP prescribed amoxicillin and subsequently sent for CT 8/9 which revealed a focal inflammatory process associated with thickened small bowel loops and mesenteric inflammation. After conferring with GI, the patient was started on augmentin which did not improve symptoms. This prompted referral to ED where repeat CT demonstrated abnormal small bowel loops with wall thickening and mesenteric congestion and edema however there was new concern for contained perforation. Case discussed with GI who recommended surgery evaluation. The patient was admitted, started on zosyn empirically, stool studies sent and ultimately resulted negative. Exploratory laparotomy on 8/19 revealed normal-appearing small bowel with some areas of irritation without major thickening consistent with resolving enteritis.   Assessment & Plan: Principal Problem:   Abdominal pain Active Problems:   Rheumatoid arthritis ([place])   Enteritis   Mixed diabetic hyperlipidemia associated with type 2 diabetes mellitus (HCC)   Type 2 diabetes mellitus without complication, without long-term current use of insulin (HCC)   GERD without esophagitis   Hypothyroidism   Acute diarrhea   Small bowel perforation (HCC)   Abdominal pain with abnormal CT scan: Only findings during exploratory laparotomy were mild irritation of small bowel,  resolving enteritis.  - Will start liquid diet, advance as tolerated. - Continue to keep surgical dressing clean and dry.  - Planning CT enterography in 6 weeks.  - Given question of perforation on CT, will complete 5 days of antibiotics per surgery recommendations.   Diarrhea: States this occurred contemporaneously with abdominal pain which preceded antibiotic treatments. Negative Gi pathogen panel and C. diff testing.  - Imodium prn but only if having 3+ liquid BMs/24 hr period to avoid precipitating postoperative ileus.  Hypokalemia:  - Supplemented and resolved. Will continue monitoring. Mg was wnl at 2.   Incomplete RBBB: Stable on repeat ECG on 02/08/2021 from prior in 2020. No ischemic features. No recent or current anginal complaints or symptoms attributable to heart failure. No further work up is currently planned.   Rheumatoid arthritis:  - Continue holding sarilumab (IL-6 inhibitor) which has not been taken for 2 weeks.    T2DM: Glucose at goal   - Continue monitoring CBGs   HLD:  - Continue statin   GERD:  - Continue PPI  Hypothyroidism:  - Continue synthroid.   Depression: Quiescent.  - Continue SSRI.  Obesity: Estimated body mass index is 33.45 kg/m as calculated from the following:   Height as of this encounter: '5\' 8"'$  (1.727 m).   Weight as of this encounter: 99.8 kg.  DVT prophylaxis: SCDs pending surgical management Code Status: Full Family Communication: At bedside Disposition Plan:  Status is: Inpatient  Remains inpatient appropriate because:Ongoing diagnostic testing needed not appropriate for outpatient work up and Inpatient level of care appropriate due to severity of illness  Dispo: The patient is from: Home  Anticipated d/c is to: Home              Patient currently is not medically stable to d/c.   Difficult to place patient No  Consultants:  Surgery  Procedures:  Exploratory laparotomy 02/09/2021 Dr.  Constance Haw.  Antimicrobials: Zosyn   Subjective: Seen postoperatively reporting significant improvement in pain at this time. No nausea or vomiting, tolerated some clears. Throat is sore s/p ETT, no dyspnea or chest pain.  Objective: Vitals:   02/09/21 1215 02/09/21 1230 02/09/21 1250 02/09/21 1314  BP: 134/76 120/79 (!) 118/95 110/77  Pulse: 73 72 78 79  Resp: '10 10  19  '$ Temp:   98 F (36.7 C) 98 F (36.7 C)  TempSrc:   Oral   SpO2: 99% 98% 96% 94%  Weight:      Height:        Intake/Output Summary (Last 24 hours) at 02/09/2021 1405 Last data filed at 02/09/2021 1126 Gross per 24 hour  Intake 1950 ml  Output 285 ml  Net 1665 ml   Filed Weights   02/07/21 1151  Weight: 99.8 kg   Gen: 65 y.o. female in no distress Pulm: Nonlabored breathing. Clear. CV: Regular rate and rhythm. No murmur, rub, or gallop. No JVD, no pitting dependent edema. GI: Abdomen in binder, appropriately tender diffusely, not distended. +BS.   Ext: Warm, no deformities Skin: Postop wound not assessed at this time. No other rashes, lesions or ulcers on visualized skin. Neuro: Drowsy but rousable and interactive, and oriented. No focal neurological deficits. Psych: Judgement and insight appear fair. Mood euthymic & affect congruent. Behavior is appropriate.    Data Reviewed: I have personally reviewed following labs and imaging studies  CBC: Recent Labs  Lab 02/07/21 1203 02/08/21 0538 02/09/21 0509  WBC 9.5 7.6 4.7  NEUTROABS 6.5 4.5  --   HGB 12.7 12.0 12.1  HCT 38.7 36.2 37.3  MCV 94.4 94.3 94.4  PLT 296 247 0000000   Basic Metabolic Panel: Recent Labs  Lab 02/07/21 1203 02/08/21 0538 02/08/21 1741 02/09/21 0509  NA 138 140  --  141  K 3.4* 2.8* 3.3* 3.5  CL 104 103  --  106  CO2 28 25  --  29  GLUCOSE 97 105*  --  95  BUN 12 9  --  7*  CREATININE 0.51 0.49  --  0.43*  CALCIUM 8.4* 8.2*  --  8.7*  MG  --  2.0  --   --    GFR: Estimated Creatinine Clearance: 84.3 mL/min (A) (by  C-G formula based on SCr of 0.43 mg/dL (L)). Liver Function Tests: Recent Labs  Lab 02/07/21 1203 02/08/21 0538 02/09/21 0509  AST 22 14* 13*  ALT '17 13 13  '$ ALKPHOS 77 67 62  BILITOT 0.5 0.8 0.8  PROT 7.5 6.5 6.2*  ALBUMIN 3.9 3.4* 3.3*   Recent Labs  Lab 02/07/21 1203  LIPASE 28   No results for input(s): AMMONIA in the last 168 hours. Coagulation Profile: Recent Labs  Lab 02/09/21 0509  INR 1.0   Cardiac Enzymes: No results for input(s): CKTOTAL, CKMB, CKMBINDEX, TROPONINI in the last 168 hours. BNP (last 3 results) No results for input(s): PROBNP in the last 8760 hours. HbA1C: Recent Labs    02/07/21 2236  HGBA1C 5.9*   CBG: Recent Labs  Lab 02/08/21 2304 02/09/21 0740 02/09/21 0846 02/09/21 1138 02/09/21 1316  GLUCAP 87 94 94 104* 132*   Lipid Profile: No results for input(s):  CHOL, HDL, LDLCALC, TRIG, CHOLHDL, LDLDIRECT in the last 72 hours. Thyroid Function Tests: No results for input(s): TSH, T4TOTAL, FREET4, T3FREE, THYROIDAB in the last 72 hours. Anemia Panel: No results for input(s): VITAMINB12, FOLATE, FERRITIN, TIBC, IRON, RETICCTPCT in the last 72 hours. Urine analysis:    Component Value Date/Time   COLORURINE AMBER (A) 01/12/2018 1217   APPEARANCEUR Cloudy (A) 04/06/2020 1635   LABSPEC 1.014 01/12/2018 1217   PHURINE 5.0 01/12/2018 1217   GLUCOSEU Negative 04/06/2020 1635   GLUCOSEU Negative 04/06/2020 1507   HGBUR NEGATIVE 01/12/2018 1217   BILIRUBINUR Negative 04/06/2020 1635   KETONESUR NEGATIVE 01/12/2018 1217   PROTEINUR Negative 04/06/2020 1635   PROTEINUR 30 (A) 01/12/2018 1217   NITRITE Positive (A) 04/06/2020 1635   NITRITE pos 04/06/2020 1507   NITRITE NEGATIVE 01/12/2018 1217   LEUKOCYTESUR 3+ (A) 04/06/2020 1635   LEUKOCYTESUR Large (3+) (A) 04/06/2020 1507   Recent Results (from the past 240 hour(s))  Resp Panel by RT-PCR (Flu A&B, Covid) Nasopharyngeal Swab     Status: None   Collection Time: 02/07/21  5:28 PM    Specimen: Nasopharyngeal Swab; Nasopharyngeal(NP) swabs in vial transport medium  Result Value Ref Range Status   SARS Coronavirus 2 by RT PCR NEGATIVE NEGATIVE Final    Comment: (NOTE) SARS-CoV-2 target nucleic acids are NOT DETECTED.  The SARS-CoV-2 RNA is generally detectable in upper respiratory specimens during the acute phase of infection. The lowest concentration of SARS-CoV-2 viral copies this assay can detect is 138 copies/mL. A negative result does not preclude SARS-Cov-2 infection and should not be used as the sole basis for treatment or other patient management decisions. A negative result may occur with  improper specimen collection/handling, submission of specimen other than nasopharyngeal swab, presence of viral mutation(s) within the areas targeted by this assay, and inadequate number of viral copies(<138 copies/mL). A negative result must be combined with clinical observations, patient history, and epidemiological information. The expected result is Negative.  Fact Sheet for Patients:  EntrepreneurPulse.com.au  Fact Sheet for Healthcare Providers:  IncredibleEmployment.be  This test is no t yet approved or cleared by the [place] FDA and  has been authorized for detection and/or diagnosis of SARS-CoV-2 by FDA under an Emergency Use Authorization (EUA). This EUA will remain  in effect (meaning this test can be used) for the duration of the COVID-19 declaration under Section 564(b)(1) of the Act, 21 U.S.C.section 360bbb-3(b)(1), unless the authorization is terminated  or revoked sooner.       Influenza A by PCR NEGATIVE NEGATIVE Final   Influenza B by PCR NEGATIVE NEGATIVE Final    Comment: (NOTE) The Xpert Xpress SARS-CoV-2/FLU/RSV plus assay is intended as an aid in the diagnosis of influenza from Nasopharyngeal swab specimens and should not be used as a sole basis for treatment. Nasal washings and aspirates are  unacceptable for Xpert Xpress SARS-CoV-2/FLU/RSV testing.  Fact Sheet for Patients: EntrepreneurPulse.com.au  Fact Sheet for Healthcare Providers: IncredibleEmployment.be  This test is not yet approved or cleared by the [place] FDA and has been authorized for detection and/or diagnosis of SARS-CoV-2 by FDA under an Emergency Use Authorization (EUA). This EUA will remain in effect (meaning this test can be used) for the duration of the COVID-19 declaration under Section 564(b)(1) of the Act, 21 U.S.C. section 360bbb-3(b)(1), unless the authorization is terminated or revoked.  Performed at [facility], [address] [place], [place] [address]   Gastrointestinal Panel by PCR , Stool  Status: None   Collection Time: 02/08/21 10:20 AM   Specimen: STOOL  Result Value Ref Range Status   Campylobacter species NOT DETECTED NOT DETECTED Final   Plesimonas shigelloides NOT DETECTED NOT DETECTED Final   Salmonella species NOT DETECTED NOT DETECTED Final   Yersinia enterocolitica NOT DETECTED NOT DETECTED Final   Vibrio species NOT DETECTED NOT DETECTED Final   Vibrio cholerae NOT DETECTED NOT DETECTED Final   Enteroaggregative E coli (EAEC) NOT DETECTED NOT DETECTED Final   Enteropathogenic E coli (EPEC) NOT DETECTED NOT DETECTED Final   Enterotoxigenic E coli (ETEC) NOT DETECTED NOT DETECTED Final   Shiga like toxin producing E coli (STEC) NOT DETECTED NOT DETECTED Final   Shigella/Enteroinvasive E coli (EIEC) NOT DETECTED NOT DETECTED Final   Cryptosporidium NOT DETECTED NOT DETECTED Final   Cyclospora cayetanensis NOT DETECTED NOT DETECTED Final   Entamoeba histolytica NOT DETECTED NOT DETECTED Final   Giardia lamblia NOT DETECTED NOT DETECTED Final   Adenovirus F40/41 NOT DETECTED NOT DETECTED Final   Astrovirus NOT DETECTED NOT DETECTED Final   Norovirus GI/GII NOT DETECTED NOT DETECTED Final   Rotavirus A NOT DETECTED NOT DETECTED Final    Sapovirus (I, II, IV, and V) NOT DETECTED NOT DETECTED Final    Comment: Performed at [facility], [address] [place], [place] [address]  C Difficile Quick Screen w PCR reflex     Status: None   Collection Time: 02/08/21 10:20 AM   Specimen: STOOL  Result Value Ref Range Status   C Diff antigen NEGATIVE NEGATIVE Final   C Diff toxin NEGATIVE NEGATIVE Final   C Diff interpretation No C. difficile detected.  Final    Comment: Performed at [facility], [address], [place], [place] [address]  Surgical pcr screen     Status: None   Collection Time: 02/08/21 12:03 PM   Specimen: Nasal Mucosa; Nasal Swab  Result Value Ref Range Status   MRSA, PCR NEGATIVE NEGATIVE Final   Staphylococcus aureus NEGATIVE NEGATIVE Final    Comment: (NOTE) The Xpert SA Assay (FDA approved for NASAL specimens in patients 55 years of age and older), is one component of a comprehensive surveillance program. It is not intended to diagnose infection nor to guide or monitor treatment. Performed at [facility], [address], [place], [place] [address]       Radiology Studies: CT ABDOMEN PELVIS W CONTRAST  Result Date: 02/07/2021 CLINICAL DATA:  Abdominal pain and swelling.  Diarrhea. EXAM: CT ABDOMEN AND PELVIS WITH CONTRAST TECHNIQUE: Multidetector CT imaging of the abdomen and pelvis was performed using the standard protocol following bolus administration of intravenous contrast. CONTRAST:  39m OMNIPAQUE IOHEXOL 350 MG/ML SOLN COMPARISON:  01/30/2021. FINDINGS: Lower chest: Stable scarring in the lingula. Hepatobiliary: No suspicious focal abnormality within the liver parenchyma. Gallbladder surgically absent. No intrahepatic or extrahepatic biliary dilation. Pancreas: No focal mass lesion. No dilatation of the main duct. No intraparenchymal cyst. No peripancreatic edema. Spleen: No splenomegaly. No focal mass lesion. Adrenals/Urinary Tract: No adrenal nodule or mass. Kidneys unremarkable. No  evidence for hydroureter. The urinary bladder appears normal for the degree of distention. Stomach/Bowel: Stomach is decompressed. Duodenum is normally positioned as is the ligament of Treitz. Duodenal diverticulum noted. Small bowel loops in the upper right pelvis again show wall thickening with mesenteric congestion and edema. Small cluster of tiny gas bubbles identified on image 51 of series 2 appears to be but is not definitely extraluminal. The previously described small  lymph nodes in this region are similar. The small bowel wall in this region is ill-defined suggesting perienteric edema. The terminal ileum is normal. The appendix is not well visualized, but there is no edema or inflammation in the region of the cecum. No gross colonic mass. No colonic wall thickening. Diverticular changes are noted in the left colon without evidence of diverticulitis. Large stool volume evident. Vascular/Lymphatic: No abdominal aortic aneurysm. 11 mm aortocaval node on 34/2 is mildly enlarged. As noted above, clustered increased number of nodes identified in the small bowel mesentery of the upper pelvis, in the region of the abnormal small bowel loops. No pelvic sidewall lymphadenopathy. Reproductive: Unremarkable. Other: No intraperitoneal free fluid. No gross intraperitoneal free air. Musculoskeletal: Tiny umbilical hernia contains only fat. No worrisome lytic or sclerotic osseous abnormality. Multilevel lumbar fusion evident. IMPRESSION: 1. Similar appearance of abnormal small bowel loops in the upper central and right pelvis with wall thickening, mesenteric congestion, and edema. Small cluster of tiny gas bubbles identified on today's study appears to be but is not definitely extraluminal. This new finding of clustered tiny gas bubbles suggests contained micro perforation. There is no intraperitoneal free fluid or gross intraperitoneal free gas. Small-bowel abnormality may be related to infectious/inflammatory enteritis  with perforation or foreign body perforation. Small bowel neoplasm not excluded. No discrete or drainable abscess present at this time. Repeat CT imaging with oral and intravenous contrast may prove helpful to further evaluate. 2. Similar appearance of clustered increased number of nodes in the small bowel mesentery of the upper pelvis, similar to prior with mild retroperitoneal lymphadenopathy as before. 3. Large stool volume. Imaging features could be compatible with clinical constipation. 4. Left colonic diverticulosis without diverticulitis. 5. Aortic Atherosclerosis (ICD10-I70.0). Electronically Signed   By: Misty Stanley M.D.   On: 02/07/2021 15:19    Scheduled Meds:  atorvastatin  40 mg Oral QHS   chlorhexidine       feeding supplement  1 Container Oral TID BM   gabapentin  300 mg Oral Daily   insulin aspart  0-15 Units Subcutaneous TID AC & HS   levothyroxine  88 mcg Oral Daily   pantoprazole  40 mg Oral BID   scopolamine       sertraline  100 mg Oral Daily   Continuous Infusions:  piperacillin-tazobactam (ZOSYN)  IV 3.375 g (02/09/21 0748)     LOS: 1 day   Time spent: 25 minutes.  Patrecia Pour, MD [facility] [email or web address] 02/09/2021, 2:05 PM

## 2021-02-09 NOTE — Anesthesia Postprocedure Evaluation (Signed)
Anesthesia Post Note  Patient: Monique Weiss  Procedure(s) Performed: EXPLORATORY LAPAROTOMY  Patient location during evaluation: PACU Anesthesia Type: General Level of consciousness: awake and alert and oriented Pain management: pain level controlled Vital Signs Assessment: post-procedure vital signs reviewed and stable Respiratory status: spontaneous breathing and respiratory function stable Cardiovascular status: blood pressure returned to baseline and stable Postop Assessment: no apparent nausea or vomiting Anesthetic complications: no   No notable events documented.   Last Vitals:  Vitals:   02/09/21 1215 02/09/21 1230  BP: 134/76 120/79  Pulse: 73 72  Resp: 10 10  Temp:    SpO2: 99% 98%    Last Pain:  Vitals:   02/09/21 1230  TempSrc:   PainSc: 2                  Janisa Labus C Jaimere Feutz

## 2021-02-09 NOTE — Progress Notes (Addendum)
[facility]  Patient's sister notified OR done. No bowel resection. No pathology to indicate resection. Diet as tolerated, PRN for pain, binder, will likely get CT enterography in 6 weeks to ensure not missing anything given the enteritis focally on both CT.  Would complete 5 days of antibiotics to be safe given question of perforation on CT on admission.   Curlene Labrum, MD [facility] [address] [place], [place] [address] [phone] (office)

## 2021-02-09 NOTE — Anesthesia Procedure Notes (Signed)
Procedure Name: Intubation Date/Time: 02/09/2021 10:29 AM Performed by: Tacy Learn, CRNA Pre-anesthesia Checklist: Patient identified, Emergency Drugs available, Suction available, Patient being monitored and Timeout performed Patient Re-evaluated:Patient Re-evaluated prior to induction Oxygen Delivery Method: Circle system utilized Preoxygenation: Pre-oxygenation with 100% oxygen Induction Type: IV induction Laryngoscope Size: Miller and 2 Grade View: Grade I Tube type: Oral Tube size: 7.0 mm Number of attempts: 1 Airway Equipment and Method: Stylet and Video-laryngoscopy Placement Confirmation: ETT inserted through vocal cords under direct vision, positive ETCO2, CO2 detector and breath sounds checked- equal and bilateral Secured at: 21 cm Tube secured with: Tape Dental Injury: Teeth and Oropharynx as per pre-operative assessment

## 2021-02-09 NOTE — Progress Notes (Signed)
Pt transported via bed to endo by Endo staff.

## 2021-02-09 NOTE — Transfer of Care (Signed)
Immediate Anesthesia Transfer of Care Note  Patient: Monique Weiss  Procedure(s) Performed: EXPLORATORY LAPAROTOMY  Patient Location: PACU  Anesthesia Type:General  Level of Consciousness: awake, drowsy, patient cooperative and responds to stimulation  Airway & Oxygen Therapy: Patient Spontanous Breathing and Patient connected to face mask oxygen  Post-op Assessment: Report given to RN, Post -op Vital signs reviewed and stable and Patient moving all extremities X 4  Post vital signs: Reviewed and stable  Last Vitals:  Vitals Value Taken Time  BP 167/87 02/09/21 1135  Temp    Pulse 88 02/09/21 1137  Resp 14 02/09/21 1137  SpO2 98 % 02/09/21 1137  Vitals shown include unvalidated device data.  Last Pain:  Vitals:   02/09/21 0839  TempSrc:   PainSc: 0-No pain      Patients Stated Pain Goal: 2 ([id number])  Complications: No notable events documented.

## 2021-02-09 NOTE — Op Note (Signed)
[facility] Operative Note  02/09/21  Preoperative Diagnosis:  Enteritis, small bowel thickening, ? Perforation from [place] or foreign body   Postoperative Diagnosis: Enteritis    Procedure(s) Performed: Exploratory laparotomy    Surgeon: Lanell Matar. Constance Haw, MD   Assistants: No qualified resident was available    Anesthesia: General endotracheal   Anesthesiologist: Dr. Wyatt Haste    Specimens: None    Estimated Blood Loss: Minimal   Blood Replacement: None    Complications: None   Wound Class: Clean    Operative Indications: Ms. Kaupp is a 65 yo with a area of small bowel on CT scan that was thickened and concern for a focal perforation on Ct and concern for possible Meckel's versus neoplasm versus foreign body versus enteritis with perforation. Given this she was brought into the hospital, kept NPO and antibiotics were administered. We discussed exploration for diagnosis and possible resection and risk of bleeding, infection, anastomotic leak, finding a cancer or Meckel's.   Findings: Normal appearing small bowel some irritated areas but no major thickening, resolving enteritis    Procedure: The patient was taken to the operating room and placed supine. General endotracheal anesthesia was induced. Intravenous antibiotics were administered per protocol.  An nasogastric tube positioned to decompress the stomach. A foley catheter was placed. The abdomen was prepared and draped in the usual sterile fashion.   A midline incision was made and carried down into the fascia and the fascia was entered with scissors. A wound protector was placed. The small bowel was ran from the ligment of treitz to the terminal ileum three times and palpated the entire length. No signs of perforation, thickening, Meckel's, mass or foreign body were noted. There was minor areas that were irritated likely from resolving enteritis.  The liver was palpated and no findings were noted, the  stomach was decompressed with the NG, the colon was palpated and no obvious lesion and there was some diverticula in the sigmoid colon.  The exploration was negative for pathology that would require a resection.   The bowel was placed into the abdomen in anatomic position, omentum was placed over the bowel. The abdomen was closed in the normal fashion with 0 PDS suture.  Exparel was injected. Staples were used to close the skin and a honeycomb dressing was placed.  The NG and foley were removed.   Final inspection revealed acceptable hemostasis. All counts were correct at the end of the case. The patient was awakened from anesthesia and extubatede without complication.  The patient went to the PACU in stable condition.   Curlene Labrum, MD [facility] [address] [place], [place] [address] [phone] (office)

## 2021-02-09 NOTE — Progress Notes (Signed)
Pt's daughter Velna Hatchet updated on pt condition per info from Dr. Constance Haw. States understanding.

## 2021-02-10 LAB — BASIC METABOLIC PANEL
Anion gap: 6 (ref 5–15)
BUN: 15 mg/dL (ref 8–23)
CO2: 28 mmol/L (ref 22–32)
Calcium: 8.5 mg/dL — ABNORMAL LOW (ref 8.9–10.3)
Chloride: 102 mmol/L (ref 98–111)
Creatinine, Ser: 0.62 mg/dL (ref 0.44–1.00)
GFR, Estimated: >60 mL/min (ref >60)
Glucose, Bld: 136 mg/dL — ABNORMAL HIGH (ref 70–99)
Potassium: 4.7 mmol/L (ref 3.5–5.1)
Sodium: 136 mmol/L (ref 135–145)

## 2021-02-10 LAB — GLUCOSE, CAPILLARY
Glucose-Capillary: 128 mg/dL — ABNORMAL HIGH (ref 70–99)
Glucose-Capillary: 150 mg/dL — ABNORMAL HIGH (ref 70–99)

## 2021-02-10 MED ORDER — AMOXICILLIN-POT CLAVULANATE 875-125 MG PO TABS: 1.0000 | ORAL_TABLET | Freq: Two times a day (BID) | ORAL | 0 refills | Status: AC

## 2021-02-10 MED ORDER — PROMETHAZINE HCL 12.5 MG PO TABS: 12.5000 mg | ORAL_TABLET | Freq: Four times a day (QID) | ORAL | 0 refills | Status: DC | PRN

## 2021-02-10 NOTE — Discharge Summary (Signed)
Physician Discharge Summary  Monique Weiss [id number] DOB: 10-22-1955 DOA: 02/07/2021  PCP: Asencion Noble, MD  Admit date: 02/07/2021 Discharge date: 02/10/2021  Admitted From: Home Disposition: Home   Recommendations for Outpatient Follow-up:  Follow up with PCP in 1-2 weeks Follow up with general surgery, Dr. Constance Haw for postop care, removal of staples. Will need CT enterography in ~6 weeks. Follow up with rheumatology, Dr. Amil Amen. Advised patient to hold sarilumab while being treated for infection and while wound heals over 1-2 weeks but to seek advice if arthritis symptoms flare.  Home Health: None Equipment/Devices: None new Discharge Condition: Stable CODE STATUS: Full Diet recommendation: Carb-modified  Brief/Interim Summary: Monique Weiss is a 65 y.o. female with a history of large ventral hernia, GERD, HLD, T2DM, hypothyroidism, RA on immunosuppression, and OSA not on CPAP who presented to the ED at [facility] 8/17 at the direction of her PCP due to persistent diarrhea and abdominal pain despite rounds of antibiotics and abnormal CT abdomen.    She had been seen at [facility] 7/13 for this where XR was unremarkable, though severity of pain prompted recommendation to go to ED which the patient declined. With ongoing symptoms, her PCP prescribed amoxicillin and subsequently sent for CT 8/9 which revealed a focal inflammatory process associated with thickened small bowel loops and mesenteric inflammation. After conferring with GI, the patient was started on augmentin which did not improve symptoms. This prompted referral to ED where repeat CT demonstrated abnormal small bowel loops with wall thickening and mesenteric congestion and edema however there was new concern for contained perforation. Case discussed with GI who recommended surgery evaluation. The patient was admitted, started on zosyn empirically, stool studies sent and ultimately resulted negative. Exploratory laparotomy on 8/19 revealed  normal-appearing small bowel with some areas of irritation without major thickening consistent with resolving enteritis. Postoperatively, her symptoms actually improved and she is requesting discharge. Surgery has cleared her for DC, recommending 5 days antibiotics, follow up as outpatient for staple removal, and eventual CT enterography.   Discharge Diagnoses:  Principal Problem:   Abdominal pain Active Problems:   Rheumatoid arthritis ([place])   Enteritis   Mixed diabetic hyperlipidemia associated with type 2 diabetes mellitus (HCC)   Type 2 diabetes mellitus without complication, without long-term current use of insulin (HCC)   GERD without esophagitis   Hypothyroidism   Acute diarrhea   Small bowel perforation (HCC)   Abdominal pain with abnormal CT scan: Only findings during exploratory laparotomy were mild irritation of small bowel, resolving enteritis.  - Tolerating an advanced diet with very tolerable pain. Declines any prescription stronger than ibuprofen and will also take tylenol. Phenergan prescribed prn nause/vomiting.  - Continue to keep surgical wound clean and dry until surgery follow up in ~2 weeks.  - Planning CT enterography in 6 weeks.  - Given question of perforation on CT, will complete 5 days of antibiotics per surgery recommendations.    Diarrhea: States this occurred contemporaneously with abdominal pain which preceded antibiotic treatments. Negative Gi pathogen panel and C. diff testing. This has resolved postoperatively.   Hypokalemia:  - Supplemented and resolved.    Incomplete RBBB: Stable on repeat ECG on 02/08/2021 from prior in 2020. No ischemic features. No recent or current anginal complaints or symptoms attributable to heart failure. No further work up is currently planned.    Rheumatoid arthritis:  - Continue holding sarilumab (IL-6 inhibitor) until wound healed.   T2DM: Glucose at goal. Continue metformin.   HLD:  -  Continue statin   GERD:  -  Continue PPI   Hypothyroidism:  - Continue synthroid.    Depression: Quiescent.  - Continue SSRI.   Obesity: Estimated body mass index is 33.45 kg/m  Discharge Instructions Discharge Instructions     Diet - low sodium heart healthy   Complete by: As directed    Diet Carb Modified   Complete by: As directed    Discharge instructions   Complete by: As directed    Continue antibiotics with augmentin twice daily, first dose will be due tonight. you only need 4 more doses of antibiotics.   Hold the kevzara for now due to healing infection and need to allow the surgical wound to heal. Hold this until you follow up with Dr. Constance Haw OR seek medical advice if you begin experiencing a rheumatoid arthritis flare.   You may take tylenol and/or ibuprofen (only up to '600mg'$  at at time) for pain, no more frequently than every 6 hours.   Otherwise, continue medications as you were. Seek medical attention right away if you develop worsening diarrhea, abdominal pain, fever, or inability to keep fluids down.   Increase activity slowly   Complete by: As directed    No dressing needed   Complete by: As directed       Allergies as of 02/10/2021   No Known Allergies      Medication List     STOP taking these medications    amoxicillin 500 MG capsule Commonly known as: AMOXIL   clobetasol cream 0.05 % Commonly known as: TEMOVATE   docusate sodium 100 MG capsule Commonly known as: Colace   ibuprofen 800 MG tablet Commonly known as: ADVIL       TAKE these medications    Acetaminophen 325 MG Caps Take 650 mg by mouth every 6 (six) hours as needed.   amoxicillin-clavulanate 875-125 MG tablet Commonly known as: Augmentin Take 1 tablet by mouth 2 (two) times daily for 2 days.   atorvastatin 40 MG tablet Commonly known as: LIPITOR Take 40 mg by mouth at bedtime.   cyclobenzaprine 10 MG tablet Commonly known as: FLEXERIL Take 10 mg by mouth 3 (three) times daily as needed for  muscle spasms.   furosemide 40 MG tablet Commonly known as: LASIX Take 80 mg by mouth daily.   gabapentin 300 MG capsule Commonly known as: NEURONTIN Take 300 mg by mouth daily.   hydroxychloroquine 200 MG tablet Commonly known as: PLAQUENIL Take 200 mg by mouth 2 (two) times daily.   Kevzara 200 MG/1.14ML Soaj Generic drug: Sarilumab 200 mg every 14 (fourteen) days.   levothyroxine 88 MCG tablet Commonly known as: SYNTHROID Take 88 mcg by mouth daily. What changed: Another medication with the same name was removed. Continue taking this medication, and follow the directions you see here.   linaclotide 145 MCG Caps capsule Commonly known as: LINZESS Take 145 mcg by mouth daily as needed (constipation).   metFORMIN 1000 MG tablet Commonly known as: GLUCOPHAGE Take 1,000 mg by mouth 2 (two) times daily with a meal.   pantoprazole 40 MG tablet Commonly known as: PROTONIX Take 40 mg by mouth 2 (two) times daily.   promethazine 12.5 MG tablet Commonly known as: PHENERGAN Take 12.5-25 mg by mouth every 6 (six) hours as needed for nausea or vomiting.   sertraline 100 MG tablet Commonly known as: ZOLOFT Take 100 mg by mouth daily. What changed: Another medication with the same name was removed. Continue taking this medication,  and follow the directions you see here.   spironolactone 25 MG tablet Commonly known as: ALDACTONE Take 25 mg by mouth every other day.               Discharge Care Instructions  (From admission, onward)           Start     Ordered   02/10/21 0000  No dressing needed        02/10/21 1134            Follow-up Information     Virl Cagey, MD Follow up on 02/22/2021.   Specialty: General Surgery Why: staple removal Contact information: [address] [place] [address] [phone]         Asencion Noble, MD Follow up.   Specialty: Internal Medicine Contact information: [address] [place]  [address] [phone]         Hennie Duos, MD Follow up.   Specialty: Rheumatology Contact information: [place] [address] [phone]                No Known Allergies  Consultations: Surgery  Procedures/Studies: CT ABDOMEN PELVIS WO CONTRAST  Result Date: 01/30/2021 CLINICAL DATA:  Generalized abdominal pain, distension, bloating, and nausea. History diabetes mellitus, hypertension EXAM: CT ABDOMEN AND PELVIS WITHOUT CONTRAST TECHNIQUE: Multidetector CT imaging of the abdomen and pelvis was performed following the standard protocol without IV contrast. Sagittal and coronal MPR images reconstructed from axial data set. No oral contrast administered. COMPARISON:  06/17/2019 FINDINGS: Lower chest: Lung bases clear Hepatobiliary: Gallbladder surgically absent. Liver normal appearance. Pancreas: Single parenchymal calcification at pancreatic body. Pancreas otherwise normal. Spleen: Normal appearance Adrenals/Urinary Tract: Adrenal glands, kidneys, ureters, and bladder normal appearance Stomach/Bowel: Distal descending and sigmoid diverticulosis without evidence of diverticulitis normal appendix. Remainder of colon normal appearance. Stomach unremarkable. Focal inflammatory process identified in central upper pelvis associated with small bowel loops which demonstrate mild wall thickening, mesenteric infiltration, and multiple but normal size mesenteric nodes. No definite evidence of bowel obstruction. No evidence of perforation or abscess. Vascular/Lymphatic: Scattered pelvic phleboliths. Minimal atherosclerotic calcification aorta and iliac arteries. Reproductive: Unremarkable uterus and ovaries. Other: Trace free pelvic fluid. No free air. Tiny umbilical hernia containing fat. Musculoskeletal: Prior lumbar fusion at multiple levels. Osseous demineralization. IMPRESSION: Focal inflammatory process in the central upper pelvis associated with small bowel  loops which demonstrate mild wall thickening, mesenteric infiltration, and multiple but normal size mesenteric nodes; differential diagnosis would include focal enteritis such as from infection or inflammatory Crohn's disease, perforation by foreign body, Meckel's diverticulitis not completely excluded. No definite evidence of bowel obstruction or perforation. Distal colonic diverticulosis without evidence of diverticulitis. Tiny umbilical hernia containing fat. Aortic Atherosclerosis (ICD10-I70.0). Findings called to Dr.  Willey Blade on 01/30/2021 at 1445 hours. Electronically Signed   By: Lavonia Dana M.D.   On: 01/30/2021 14:47   CT ABDOMEN PELVIS W CONTRAST  Result Date: 02/07/2021 CLINICAL DATA:  Abdominal pain and swelling.  Diarrhea. EXAM: CT ABDOMEN AND PELVIS WITH CONTRAST TECHNIQUE: Multidetector CT imaging of the abdomen and pelvis was performed using the standard protocol following bolus administration of intravenous contrast. CONTRAST:  29m OMNIPAQUE IOHEXOL 350 MG/ML SOLN COMPARISON:  01/30/2021. FINDINGS: Lower chest: Stable scarring in the lingula. Hepatobiliary: No suspicious focal abnormality within the liver parenchyma. Gallbladder surgically absent. No intrahepatic or extrahepatic biliary dilation. Pancreas: No focal mass lesion. No dilatation of the main duct. No intraparenchymal cyst.  No peripancreatic edema. Spleen: No splenomegaly. No focal mass lesion. Adrenals/Urinary Tract: No adrenal nodule or mass. Kidneys unremarkable. No evidence for hydroureter. The urinary bladder appears normal for the degree of distention. Stomach/Bowel: Stomach is decompressed. Duodenum is normally positioned as is the ligament of Treitz. Duodenal diverticulum noted. Small bowel loops in the upper right pelvis again show wall thickening with mesenteric congestion and edema. Small cluster of tiny gas bubbles identified on image 51 of series 2 appears to be but is not definitely extraluminal. The previously described  small lymph nodes in this region are similar. The small bowel wall in this region is ill-defined suggesting perienteric edema. The terminal ileum is normal. The appendix is not well visualized, but there is no edema or inflammation in the region of the cecum. No gross colonic mass. No colonic wall thickening. Diverticular changes are noted in the left colon without evidence of diverticulitis. Large stool volume evident. Vascular/Lymphatic: No abdominal aortic aneurysm. 11 mm aortocaval node on 34/2 is mildly enlarged. As noted above, clustered increased number of nodes identified in the small bowel mesentery of the upper pelvis, in the region of the abnormal small bowel loops. No pelvic sidewall lymphadenopathy. Reproductive: Unremarkable. Other: No intraperitoneal free fluid. No gross intraperitoneal free air. Musculoskeletal: Tiny umbilical hernia contains only fat. No worrisome lytic or sclerotic osseous abnormality. Multilevel lumbar fusion evident. IMPRESSION: 1. Similar appearance of abnormal small bowel loops in the upper central and right pelvis with wall thickening, mesenteric congestion, and edema. Small cluster of tiny gas bubbles identified on today's study appears to be but is not definitely extraluminal. This new finding of clustered tiny gas bubbles suggests contained micro perforation. There is no intraperitoneal free fluid or gross intraperitoneal free gas. Small-bowel abnormality may be related to infectious/inflammatory enteritis with perforation or foreign body perforation. Small bowel neoplasm not excluded. No discrete or drainable abscess present at this time. Repeat CT imaging with oral and intravenous contrast may prove helpful to further evaluate. 2. Similar appearance of clustered increased number of nodes in the small bowel mesentery of the upper pelvis, similar to prior with mild retroperitoneal lymphadenopathy as before. 3. Large stool volume. Imaging features could be compatible with  clinical constipation. 4. Left colonic diverticulosis without diverticulitis. 5. Aortic Atherosclerosis (ICD10-I70.0). Electronically Signed   By: Misty Stanley M.D.   On: 02/07/2021 15:19     02/09/21 EXPLORATORY LAPAROTOMY Virl Cagey, MD    Subjective: Feels well, sitting at EOB wanting to go home. Pain is mild, no diarrhea. No fever.  Discharge Exam: Vitals:   02/09/21 2113 02/10/21 0510  BP: 110/68 107/67  Pulse: 66 (!) 58  Resp:    Temp: 97.9 F (36.6 C)   SpO2: 94% 93%   General: Pt is alert, awake, not in acute distress Cardiovascular: RRR, S1/S2 +, no rubs, no gallops Respiratory: CTA bilaterally, no wheezing, no rhonchi Abdominal: Soft, very minimally appropriately tender near surgical site, otherwise NT, ND, bowel sounds + Extremities: No edema, no cyanosis  Labs: BNP (last 3 results) No results for input(s): BNP in the last 8760 hours. Basic Metabolic Panel: Recent Labs  Lab 02/07/21 1203 02/08/21 0538 02/08/21 1741 02/09/21 0509 02/10/21 0550  NA 138 140  --  141 136  K 3.4* 2.8* 3.3* 3.5 4.7  CL 104 103  --  106 102  CO2 28 25  --  29 28  GLUCOSE 97 105*  --  95 136*  BUN 12 9  --  7*  15  CREATININE 0.51 0.49  --  0.43* 0.62  CALCIUM 8.4* 8.2*  --  8.7* 8.5*  MG  --  2.0  --   --   --    Liver Function Tests: Recent Labs  Lab 02/07/21 1203 02/08/21 0538 02/09/21 0509  AST 22 14* 13*  ALT '17 13 13  '$ ALKPHOS 77 67 62  BILITOT 0.5 0.8 0.8  PROT 7.5 6.5 6.2*  ALBUMIN 3.9 3.4* 3.3*   Recent Labs  Lab 02/07/21 1203  LIPASE 28   No results for input(s): AMMONIA in the last 168 hours. CBC: Recent Labs  Lab 02/07/21 1203 02/08/21 0538 02/09/21 0509  WBC 9.5 7.6 4.7  NEUTROABS 6.5 4.5  --   HGB 12.7 12.0 12.1  HCT 38.7 36.2 37.3  MCV 94.4 94.3 94.4  PLT 296 247 233   Cardiac Enzymes: No results for input(s): CKTOTAL, CKMB, CKMBINDEX, TROPONINI in the last 168 hours. BNP: Invalid input(s): POCBNP CBG: Recent Labs  Lab  02/09/21 1316 02/09/21 1601 02/09/21 2116 02/10/21 0717 02/10/21 1104  GLUCAP 132* 138* 204* 128* 150*   D-Dimer No results for input(s): DDIMER in the last 72 hours. Hgb A1c Recent Labs    02/07/21 2236  HGBA1C 5.9*   Lipid Profile No results for input(s): CHOL, HDL, LDLCALC, TRIG, CHOLHDL, LDLDIRECT in the last 72 hours. Thyroid function studies No results for input(s): TSH, T4TOTAL, T3FREE, THYROIDAB in the last 72 hours.  Invalid input(s): FREET3 Anemia work up No results for input(s): VITAMINB12, FOLATE, FERRITIN, TIBC, IRON, RETICCTPCT in the last 72 hours. Urinalysis    Component Value Date/Time   COLORURINE AMBER (A) 01/12/2018 1217   APPEARANCEUR Cloudy (A) 04/06/2020 1635   LABSPEC 1.014 01/12/2018 1217   PHURINE 5.0 01/12/2018 1217   GLUCOSEU Negative 04/06/2020 1635   GLUCOSEU Negative 04/06/2020 1507   HGBUR NEGATIVE 01/12/2018 1217   BILIRUBINUR Negative 04/06/2020 1635   KETONESUR NEGATIVE 01/12/2018 1217   PROTEINUR Negative 04/06/2020 1635   PROTEINUR 30 (A) 01/12/2018 1217   NITRITE Positive (A) 04/06/2020 1635   NITRITE pos 04/06/2020 1507   NITRITE NEGATIVE 01/12/2018 1217   LEUKOCYTESUR 3+ (A) 04/06/2020 1635   LEUKOCYTESUR Large (3+) (A) 04/06/2020 1507    Microbiology Recent Results (from the past 240 hour(s))  Resp Panel by RT-PCR (Flu A&B, Covid) Nasopharyngeal Swab     Status: None   Collection Time: 02/07/21  5:28 PM   Specimen: Nasopharyngeal Swab; Nasopharyngeal(NP) swabs in vial transport medium  Result Value Ref Range Status   SARS Coronavirus 2 by RT PCR NEGATIVE NEGATIVE Final    Comment: (NOTE) SARS-CoV-2 target nucleic acids are NOT DETECTED.  The SARS-CoV-2 RNA is generally detectable in upper respiratory specimens during the acute phase of infection. The lowest concentration of SARS-CoV-2 viral copies this assay can detect is 138 copies/mL. A negative result does not preclude SARS-Cov-2 infection and should not be used as  the sole basis for treatment or other patient management decisions. A negative result may occur with  improper specimen collection/handling, submission of specimen other than nasopharyngeal swab, presence of viral mutation(s) within the areas targeted by this assay, and inadequate number of viral copies(<138 copies/mL). A negative result must be combined with clinical observations, patient history, and epidemiological information. The expected result is Negative.  Fact Sheet for Patients:  EntrepreneurPulse.com.au  Fact Sheet for Healthcare Providers:  IncredibleEmployment.be  This test is no t yet approved or cleared by the [place] FDA and  has been  authorized for detection and/or diagnosis of SARS-CoV-2 by FDA under an Emergency Use Authorization (EUA). This EUA will remain  in effect (meaning this test can be used) for the duration of the COVID-19 declaration under Section 564(b)(1) of the Act, 21 U.S.C.section 360bbb-3(b)(1), unless the authorization is terminated  or revoked sooner.       Influenza A by PCR NEGATIVE NEGATIVE Final   Influenza B by PCR NEGATIVE NEGATIVE Final    Comment: (NOTE) The Xpert Xpress SARS-CoV-2/FLU/RSV plus assay is intended as an aid in the diagnosis of influenza from Nasopharyngeal swab specimens and should not be used as a sole basis for treatment. Nasal washings and aspirates are unacceptable for Xpert Xpress SARS-CoV-2/FLU/RSV testing.  Fact Sheet for Patients: EntrepreneurPulse.com.au  Fact Sheet for Healthcare Providers: IncredibleEmployment.be  This test is not yet approved or cleared by the [place] FDA and has been authorized for detection and/or diagnosis of SARS-CoV-2 by FDA under an Emergency Use Authorization (EUA). This EUA will remain in effect (meaning this test can be used) for the duration of the COVID-19 declaration under Section 564(b)(1) of  the Act, 21 U.S.C. section 360bbb-3(b)(1), unless the authorization is terminated or revoked.  Performed at [facility], [address], [place], [place] [address]   Gastrointestinal Panel by PCR , Stool     Status: None   Collection Time: 02/08/21 10:20 AM   Specimen: STOOL  Result Value Ref Range Status   Campylobacter species NOT DETECTED NOT DETECTED Final   Plesimonas shigelloides NOT DETECTED NOT DETECTED Final   Salmonella species NOT DETECTED NOT DETECTED Final   Yersinia enterocolitica NOT DETECTED NOT DETECTED Final   Vibrio species NOT DETECTED NOT DETECTED Final   Vibrio cholerae NOT DETECTED NOT DETECTED Final   Enteroaggregative E coli (EAEC) NOT DETECTED NOT DETECTED Final   Enteropathogenic E coli (EPEC) NOT DETECTED NOT DETECTED Final   Enterotoxigenic E coli (ETEC) NOT DETECTED NOT DETECTED Final   Shiga like toxin producing E coli (STEC) NOT DETECTED NOT DETECTED Final   Shigella/Enteroinvasive E coli (EIEC) NOT DETECTED NOT DETECTED Final   Cryptosporidium NOT DETECTED NOT DETECTED Final   Cyclospora cayetanensis NOT DETECTED NOT DETECTED Final   Entamoeba histolytica NOT DETECTED NOT DETECTED Final   Giardia lamblia NOT DETECTED NOT DETECTED Final   Adenovirus F40/41 NOT DETECTED NOT DETECTED Final   Astrovirus NOT DETECTED NOT DETECTED Final   Norovirus GI/GII NOT DETECTED NOT DETECTED Final   Rotavirus A NOT DETECTED NOT DETECTED Final   Sapovirus (I, II, IV, and V) NOT DETECTED NOT DETECTED Final    Comment: Performed at [facility], [address] [place], [place] [address]  C Difficile Quick Screen w PCR reflex     Status: None   Collection Time: 02/08/21 10:20 AM   Specimen: STOOL  Result Value Ref Range Status   C Diff antigen NEGATIVE NEGATIVE Final   C Diff toxin NEGATIVE NEGATIVE Final   C Diff interpretation No C. difficile detected.  Final    Comment: Performed at [facility] [address] [place], [place] [address]  Surgical pcr  screen     Status: None   Collection Time: 02/08/21 12:03 PM   Specimen: Nasal Mucosa; Nasal Swab  Result Value Ref Range Status   MRSA, PCR NEGATIVE NEGATIVE Final   Staphylococcus aureus NEGATIVE NEGATIVE Final    Comment: (NOTE) The Xpert SA Assay (FDA approved for NASAL specimens in patients 76 years of age and older), is one  component of a comprehensive surveillance program. It is not intended to diagnose infection nor to guide or monitor treatment. Performed at [facility], [address] [place], [place] [address]     Time coordinating discharge: Approximately 40 minutes  Patrecia Pour, MD  [facility] 02/10/2021, 11:34 AM

## 2021-02-10 NOTE — Plan of Care (Signed)
  Problem: Education: Goal: Knowledge of General Education information will improve Description: Including pain rating scale, medication(s)/side effects and non-pharmacologic comfort measures 02/10/2021 1346 by Madie Reno, RN Outcome: Completed/Met 02/10/2021 0821 by Madie Reno, RN Outcome: Progressing   Problem: Health Behavior/Discharge Planning: Goal: Ability to manage health-related needs will improve Outcome: Completed/Met   Problem: Clinical Measurements: Goal: Ability to maintain clinical measurements within normal limits will improve Outcome: Completed/Met Goal: Will remain free from infection Outcome: Completed/Met Goal: Diagnostic test results will improve Outcome: Completed/Met Goal: Respiratory complications will improve Outcome: Completed/Met Goal: Cardiovascular complication will be avoided Outcome: Completed/Met   Problem: Activity: Goal: Risk for activity intolerance will decrease 02/10/2021 1346 by Madie Reno, RN Outcome: Completed/Met 02/10/2021 9038 by Madie Reno, RN Outcome: Progressing   Problem: Nutrition: Goal: Adequate nutrition will be maintained 02/10/2021 1346 by Madie Reno, RN Outcome: Completed/Met 02/10/2021 0821 by Madie Reno, RN Outcome: Progressing   Problem: Coping: Goal: Level of anxiety will decrease Outcome: Completed/Met   Problem: Elimination: Goal: Will not experience complications related to bowel motility Outcome: Completed/Met Goal: Will not experience complications related to urinary retention Outcome: Completed/Met   Problem: Pain Managment: Goal: General experience of comfort will improve 02/10/2021 1346 by Madie Reno, RN Outcome: Completed/Met 02/10/2021 0821 by Madie Reno, RN Outcome: Progressing   Problem: Safety: Goal: Ability to remain free from injury will improve 02/10/2021 1346 by Madie Reno, RN Outcome: Completed/Met 02/10/2021 0821 by Madie Reno,  RN Outcome: Progressing   Problem: Skin Integrity: Goal: Risk for impaired skin integrity will decrease 02/10/2021 1346 by Madie Reno, RN Outcome: Completed/Met 02/10/2021 0821 by Madie Reno, RN Outcome: Progressing

## 2021-02-10 NOTE — Discharge Instructions (Signed)
Discharge Open Abdominal Surgery Instructions:  Common Complaints: Pain at the incision site is common. This will improve with time. Take your pain medications as described below. Some nausea is common and poor appetite. The main goal is to stay hydrated the first few days after surgery.   Diet/ Activity: Diet as tolerated. You have started and tolerated a diet in the hospital, and should continue to increase what you are able to eat.   You may not have a large appetite, but it is important to stay hydrated. Drink 64 ounces of water a day. Your appetite will return with time.  Keep a dry dressing in place over your staples daily or as needed. Some minor pink/ blood tinged drainage is expected. This will stop in a few days after surgery.  Shower per your regular routine daily.  Do not take hot showers. Take warm showers that are less than 10 minutes. Pat the incision dry. Wear an abdominal binder daily with activity. You do not have to wear this while sleeping or sitting.  Rest and listen to your body, but do not remain in bed all day.  Walk everyday for at least 15-20 minutes. Deep cough and move around every 1-2 hours in the first few days after surgery.  Do not lift > 10 lbs, perform excessive bending, pushing, pulling, squatting for 6-8 weeks after surgery.  The activity restrictions and the abdominal binder are to prevent hernia formation at your incision while you are healing.  Do not place lotions or balms on your incision unless instructed to specifically by Dr. Rechel Delosreyes.   Pain Expectations and Narcotics: -After surgery you will have pain associated with your incisions and this is normal. The pain is muscular and nerve pain, and will get better with time. -You are encouraged and expected to take non narcotic medications like tylenol and ibuprofen (when able) to treat pain as multiple modalities can aid with pain treatment. -Narcotics are only used when pain is severe or there is  breakthrough pain. -You are not expected to have a pain score of 0 after surgery, as we cannot prevent pain. A pain score of 3-4 that allows you to be functional, move, walk, and tolerate some activity is the goal. The pain will continue to improve over the days after surgery and is dependent on your surgery. -Due to [place] law, we are only able to give a certain amount of pain medication to treat post operative pain, and we only give additional narcotics on a patient by patient basis.  -For most laparoscopic surgery, studies have shown that the majority of patients only need 10-15 narcotic pills, and for open surgeries most patients only need 15-20.   -Having appropriate expectations of pain and knowledge of pain management with non narcotics is important as we do not want anyone to become addicted to narcotic pain medication.  -Using ice packs in the first 48 hours and heating pads after 48 hours, wearing an abdominal binder (when recommended), and using over the counter medications are all ways to help with pain management.   -Simple acts like meditation and mindfulness practices after surgery can also help with pain control and research has proven the benefit of these practices.  Medication: Take tylenol and ibuprofen as needed for pain control, alternating every 4-6 hours.  Example:  Tylenol 1000mg @ 6am, 12noon, 6pm, 12midnight (Do not exceed 4000mg of tylenol a day). Ibuprofen 800mg @ 9am, 3pm, 9pm, 3am (Do not exceed 3600mg of ibuprofen a day).    Take narcotic for breakthrough pain every 4 hours.  Take Colace for constipation related to narcotic pain medication. If you do not have a bowel movement in 2 days, take Miralax over the counter.  Drink plenty of water to also prevent constipation.   Contact Information: If you have questions or concerns, please call our office, [phone], Monday- Thursday 8AM-5PM and Friday 8AM-12Noon.  If it is after hours or on the weekend, please call [facility]  Number, [phone], and ask to speak to the surgeon on call for Dr. Constance Haw at [facility].

## 2021-02-10 NOTE — Plan of Care (Signed)
  Problem: Education: Goal: Knowledge of General Education information will improve Description: Including pain rating scale, medication(s)/side effects and non-pharmacologic comfort measures Outcome: Progressing   Problem: Activity: Goal: Risk for activity intolerance will decrease Outcome: Progressing - ambulating in room independently   Problem: Nutrition: Goal: Adequate nutrition will be maintained Outcome: Progressing - advanced to carb mod this am, discussed to notify nursing of any concerns with advanced diet.    Problem: Pain Managment: Goal: General experience of comfort will improve Outcome: Progressing - states tylenol has been controlling pain   Problem: Safety: Goal: Ability to remain free from injury will improve Outcome: Progressing   Problem: Skin Integrity: Goal: Risk for impaired skin integrity will decrease Outcome: Progressing - surgical incision clean, dry and intact, honeycomb dressing

## 2021-02-10 NOTE — Progress Notes (Signed)
[facility]  Doing well and feeling better. Eating. Ready to go home. Will see in a few weeks to get staples out and Do CTE.  Curlene Labrum, MD [facility] [address] [place], [place] [address] [phone] (office)

## 2021-02-10 NOTE — Progress Notes (Signed)
Discharge instructions reviewed with patient. Given AVS with follow-up information, home medications, prescriptions sent to pharmacy and post-op education. Verbalized understanding of instructions, med regimen, follow-up and post-op care. States will call PCP for appointment and call Dr. Constance Haw office Monday to verify appointment time. Incision clean and dry with honeycomb dressing intact, no complaints of pain at discharge. Left floor in stable condition via w/c accompanied by nurse. Discharged home, picked up by son Rodman Key.

## 2021-02-10 NOTE — Progress Notes (Signed)
stopped zosyn early d/t IV swelling, notified dr Bonner Puna. stated okay to stop, no need to restart IV, will start augmentin tonight as instructed. Patient verbalized understanding to start tonight with evening meal and be sure to eat something to avoid GI upset.

## 2021-02-12 ENCOUNTER — Encounter (HOSPITAL_COMMUNITY): Payer: Self-pay | Admitting: General Surgery

## 2021-02-16 DIAGNOSIS — K520 Gastroenteritis and colitis due to radiation: Secondary | ICD-10-CM | POA: Diagnosis not present

## 2021-02-22 ENCOUNTER — Ambulatory Visit (INDEPENDENT_AMBULATORY_CARE_PROVIDER_SITE_OTHER): Payer: Medicare Other | Admitting: General Surgery

## 2021-02-22 ENCOUNTER — Other Ambulatory Visit: Payer: Self-pay

## 2021-02-22 ENCOUNTER — Encounter: Payer: Self-pay | Admitting: General Surgery

## 2021-02-22 VITALS — BP 121/81 | HR 72 | Temp 98.3°F | Resp 16 | Ht 68.0 in | Wt 222.0 lb

## 2021-02-22 DIAGNOSIS — Z09 Encounter for follow-up examination after completed treatment for conditions other than malignant neoplasm: Secondary | ICD-10-CM

## 2021-02-22 NOTE — Progress Notes (Signed)
Subjective:     Keith Rake  Patient here for postoperative visit, status post exploratory laparotomy.  She states she is doing well.  She does have some itchiness around the staples.  She denies any fever or chills. Objective:    BP 121/81   Pulse 72   Temp 98.3 F (36.8 C) (Other (Comment))   Resp 16   Ht '5\' 8"'$  (1.727 m)   Wt 222 lb (100.7 kg)   SpO2 92%   BMI 33.75 kg/m   General:  alert, cooperative, and no distress  Abdomen soft, incision healing well.  Staples removed, Steri-Strips applied.     Assessment:    Doing well postoperatively.    Plan:   May increase activity as able.  Follow-up here as needed.

## 2021-03-30 DIAGNOSIS — E039 Hypothyroidism, unspecified: Secondary | ICD-10-CM | POA: Diagnosis not present

## 2021-03-30 DIAGNOSIS — E785 Hyperlipidemia, unspecified: Secondary | ICD-10-CM | POA: Diagnosis not present

## 2021-03-30 DIAGNOSIS — Z79899 Other long term (current) drug therapy: Secondary | ICD-10-CM | POA: Diagnosis not present

## 2021-03-30 DIAGNOSIS — E1169 Type 2 diabetes mellitus with other specified complication: Secondary | ICD-10-CM | POA: Diagnosis not present

## 2021-04-06 DIAGNOSIS — E1169 Type 2 diabetes mellitus with other specified complication: Secondary | ICD-10-CM | POA: Diagnosis not present

## 2021-04-06 DIAGNOSIS — Z23 Encounter for immunization: Secondary | ICD-10-CM | POA: Diagnosis not present

## 2021-04-06 DIAGNOSIS — R7309 Other abnormal glucose: Secondary | ICD-10-CM | POA: Diagnosis not present

## 2021-04-06 DIAGNOSIS — R609 Edema, unspecified: Secondary | ICD-10-CM | POA: Diagnosis not present

## 2021-04-16 DIAGNOSIS — M150 Primary generalized (osteo)arthritis: Secondary | ICD-10-CM | POA: Diagnosis not present

## 2021-04-16 DIAGNOSIS — R768 Other specified abnormal immunological findings in serum: Secondary | ICD-10-CM | POA: Diagnosis not present

## 2021-04-16 DIAGNOSIS — G5603 Carpal tunnel syndrome, bilateral upper limbs: Secondary | ICD-10-CM | POA: Diagnosis not present

## 2021-04-16 DIAGNOSIS — M0609 Rheumatoid arthritis without rheumatoid factor, multiple sites: Secondary | ICD-10-CM | POA: Diagnosis not present

## 2021-04-16 DIAGNOSIS — M542 Cervicalgia: Secondary | ICD-10-CM | POA: Diagnosis not present

## 2021-05-05 ENCOUNTER — Ambulatory Visit
Admission: EM | Admit: 2021-05-05 | Discharge: 2021-05-05 | Disposition: A | Discharge status: Home / Self Care | Payer: Medicare Other | Attending: Urgent Care | Admitting: Urgent Care

## 2021-05-05 ENCOUNTER — Encounter: Payer: Self-pay | Admitting: Emergency Medicine

## 2021-05-05 ENCOUNTER — Other Ambulatory Visit: Payer: Self-pay

## 2021-05-05 ENCOUNTER — Ambulatory Visit (INDEPENDENT_AMBULATORY_CARE_PROVIDER_SITE_OTHER): Payer: Medicare Other

## 2021-05-05 DIAGNOSIS — J069 Acute upper respiratory infection, unspecified: Secondary | ICD-10-CM

## 2021-05-05 DIAGNOSIS — R059 Cough, unspecified: Secondary | ICD-10-CM

## 2021-05-05 DIAGNOSIS — E119 Type 2 diabetes mellitus without complications: Secondary | ICD-10-CM

## 2021-05-05 DIAGNOSIS — R070 Pain in throat: Secondary | ICD-10-CM

## 2021-05-05 DIAGNOSIS — R0989 Other specified symptoms and signs involving the circulatory and respiratory systems: Secondary | ICD-10-CM

## 2021-05-05 MED ORDER — PSEUDOEPHEDRINE HCL 60 MG PO TABS: 60.0000 mg | ORAL_TABLET | Freq: Three times a day (TID) | ORAL | 0 refills | Status: DC | PRN

## 2021-05-05 MED ORDER — CETIRIZINE HCL 10 MG PO TABS: 10.0000 mg | ORAL_TABLET | Freq: Every day | ORAL | 0 refills | Status: DC

## 2021-05-05 MED ORDER — PROMETHAZINE-DM 6.25-15 MG/5ML PO SYRP: 5.0000 mL | ORAL_SOLUTION | Freq: Every evening | ORAL | 0 refills | Status: DC | PRN

## 2021-05-05 MED ORDER — BENZONATATE 100 MG PO CAPS: 100.0000 mg | ORAL_CAPSULE | Freq: Three times a day (TID) | ORAL | 0 refills | Status: DC | PRN

## 2021-05-05 NOTE — Discharge Instructions (Addendum)

## 2021-05-05 NOTE — ED Triage Notes (Signed)
Pt presents today with c/o of chest congestion and cough x 1 week. Denies fever.

## 2021-05-05 NOTE — ED Provider Notes (Signed)
[facility]   MRN: [id number] DOB: 11-01-1955  Subjective:   Monique Weiss is a 64 y.o. female presenting for 1 week history of throat pain from coughing, chest congestion, chest tightness.  Patient had 2 sick contacts that tested positive for RSV.  No flu exposure.  No chest pain, shortness of breath or wheezing.  No current facility-administered medications for this encounter.  Current Outpatient Medications:    Acetaminophen 325 MG CAPS, Take 650 mg by mouth every 6 (six) hours as needed., Disp: , Rfl:    atorvastatin (LIPITOR) 40 MG tablet, Take 40 mg by mouth at bedtime. , Disp: , Rfl: 11   cyclobenzaprine (FLEXERIL) 10 MG tablet, Take 10 mg by mouth 3 (three) times daily as needed for muscle spasms. , Disp: , Rfl:    furosemide (LASIX) 40 MG tablet, Take 80 mg by mouth daily., Disp: , Rfl:    gabapentin (NEURONTIN) 300 MG capsule, Take 300 mg by mouth daily., Disp: , Rfl:    hydroxychloroquine (PLAQUENIL) 200 MG tablet, Take 200 mg by mouth 2 (two) times daily. (Patient not taking: No sig reported), Disp: , Rfl:    KEVZARA 200 MG/1.14ML SOAJ, 200 mg every 14 (fourteen) days. (Patient not taking: Reported on 02/22/2021), Disp: , Rfl:    levothyroxine (SYNTHROID) 88 MCG tablet, Take 88 mcg by mouth daily., Disp: , Rfl:    linaclotide (LINZESS) 145 MCG CAPS capsule, Take 145 mcg by mouth daily as needed (constipation)., Disp: , Rfl:    metFORMIN (GLUCOPHAGE) 1000 MG tablet, Take 1,000 mg by mouth 2 (two) times daily with a meal. , Disp: , Rfl: 3   pantoprazole (PROTONIX) 40 MG tablet, Take 40 mg by mouth 2 (two) times daily., Disp: , Rfl:    promethazine (PHENERGAN) 12.5 MG tablet, Take 1 tablet (12.5 mg total) by mouth every 6 (six) hours as needed for nausea or vomiting., Disp: 10 tablet, Rfl: 0   sertraline (ZOLOFT) 100 MG tablet, Take 100 mg by mouth daily., Disp: , Rfl:    spironolactone (ALDACTONE) 25 MG tablet, Take 25 mg by mouth every other day., Disp: , Rfl:     No Known Allergies  Past Medical History:  Diagnosis Date   Anxiety    Arthritis    Complication of anesthesia    Hard to wake up   Diabetes mellitus without complication (HCC)    Type II   Hyperlipidemia    Hypertension    Hypothyroidism    PONV (postoperative nausea and vomiting)    Thyroid disease    Hypo     Past Surgical History:  Procedure Laterality Date   ANTERIOR CERVICAL CORPECTOMY N/A 07/27/2018   Procedure: Cervical five Anterior cervical corpectomy with Cervical six-seven Anterior cervical discectomy and fusion;  Surgeon: Judith Part, MD;  Location: [place];  Service: Neurosurgery;  Laterality: N/A;   BACK SURGERY      fusions x3   BELPHAROPTOSIS REPAIR     Bilateral   CHOLECYSTECTOMY N/A 01/14/2018   Procedure: LAPAROSCOPIC CHOLECYSTECTOMY;  Surgeon: Virl Cagey, MD;  Location: [facility];  Service: General;  Laterality: N/A;   DILATION AND CURETTAGE OF UTERUS     EYE SURGERY     JOINT REPLACEMENT     LAPAROTOMY N/A 02/09/2021   Procedure: EXPLORATORY LAPAROTOMY;  Surgeon: Virl Cagey, MD;  Location: [facility];  Service: General;  Laterality: N/A;   REPLACEMENT TOTAL KNEE     SHOULDER SURGERY  Family History  Problem Relation Age of Onset   Heart disease Mother    Diabetes Mother    Hypertension Mother    Heart failure Mother    Heart attack Father    Prostate cancer Father    Heart disease Sister    Hypertension Sister    Diabetes Sister    Heart attack Brother    Heart disease Brother    Hypertension Brother    Diabetes Brother    Hypertension Son    Diabetes Brother    Diabetes Brother    Hypertension Sister     Social History   Tobacco Use   Smoking status: Never   Smokeless tobacco: Never  Vaping Use   Vaping Use: Never used  Substance Use Topics   Alcohol use: Never   Drug use: Never    ROS   Objective:   Vitals: BP 118/82 (BP Location: Left Arm)   Pulse 81   Temp 98.8 F (37.1 C) (Oral)   Resp 18    SpO2 95%   Physical Exam Constitutional:      General: She is not in acute distress.    Appearance: Normal appearance. She is well-developed. She is not ill-appearing, toxic-appearing or diaphoretic.  HENT:     Head: Normocephalic and atraumatic.     Right Ear: Tympanic membrane, ear canal and external ear normal. No drainage or tenderness. No middle ear effusion. Tympanic membrane is not erythematous.     Left Ear: Tympanic membrane, ear canal and external ear normal. No drainage or tenderness.  No middle ear effusion. Tympanic membrane is not erythematous.     Nose: Nose normal. No congestion or rhinorrhea.     Mouth/Throat:     Mouth: Mucous membranes are moist. No oral lesions.     Pharynx: No pharyngeal swelling, oropharyngeal exudate, posterior oropharyngeal erythema or uvula swelling.     Tonsils: No tonsillar exudate or tonsillar abscesses.  Eyes:     General: No scleral icterus.       Right eye: No discharge.        Left eye: No discharge.     Extraocular Movements: Extraocular movements intact.     Right eye: Normal extraocular motion.     Left eye: Normal extraocular motion.     Conjunctiva/sclera: Conjunctivae normal.     Pupils: Pupils are equal, round, and reactive to light.  Cardiovascular:     Rate and Rhythm: Normal rate and regular rhythm.     Pulses: Normal pulses.     Heart sounds: Normal heart sounds. No murmur heard.   No friction rub. No gallop.  Pulmonary:     Effort: Pulmonary effort is normal. No respiratory distress.     Breath sounds: Normal breath sounds. No stridor. No wheezing, rhonchi or rales.  Musculoskeletal:     Cervical back: Normal range of motion and neck supple.  Lymphadenopathy:     Cervical: No cervical adenopathy.  Skin:    General: Skin is warm and dry.     Findings: No rash.  Neurological:     General: No focal deficit present.     Mental Status: She is alert and oriented to person, place, and time.  Psychiatric:        Mood and  Affect: Mood normal.        Behavior: Behavior normal.        Thought Content: Thought content normal.        Judgment: Judgment normal.  DG Chest 2 View  Result Date: 05/05/2021 CLINICAL DATA:  Cough and chest congestion for the past week. EXAM: CHEST - 2 VIEW COMPARISON:  Chest x-ray dated July 15, 2018. FINDINGS: The heart size and mediastinal contours are within normal limits. Both lungs are clear. The visualized skeletal structures are unremarkable. IMPRESSION: No active cardiopulmonary disease. Electronically Signed   By: Titus Dubin M.D.   On: 05/05/2021 12:19     Assessment and Plan :   PDMP not reviewed this encounter.  1. Viral URI with cough   2. Throat pain   3. Chest congestion   4. Type 2 diabetes mellitus treated without insulin ([place])    COVID and flu test pending.  We will otherwise manage for viral upper respiratory infection.  Physical exam findings reassuring and vital signs stable for discharge. Advised supportive care, offered symptomatic relief. Counseled patient on potential for adverse effects with medications prescribed/recommended today, ER and return-to-clinic precautions discussed, patient verbalized understanding.       Jaynee Eagles, PA-C 05/05/21 1235

## 2021-05-06 LAB — COVID-19, FLU A+B NAA
Influenza A, NAA: NOT DETECTED
Influenza B, NAA: NOT DETECTED
SARS-CoV-2, NAA: NOT DETECTED

## 2021-05-16 DIAGNOSIS — M4802 Spinal stenosis, cervical region: Secondary | ICD-10-CM | POA: Diagnosis not present

## 2021-06-23 DIAGNOSIS — H25813 Combined forms of age-related cataract, bilateral: Secondary | ICD-10-CM | POA: Diagnosis not present

## 2021-07-05 DIAGNOSIS — M2578 Osteophyte, vertebrae: Secondary | ICD-10-CM | POA: Diagnosis not present

## 2021-07-05 DIAGNOSIS — M4802 Spinal stenosis, cervical region: Secondary | ICD-10-CM | POA: Diagnosis not present

## 2021-07-05 DIAGNOSIS — R200 Anesthesia of skin: Secondary | ICD-10-CM | POA: Diagnosis not present

## 2021-07-19 DIAGNOSIS — M542 Cervicalgia: Secondary | ICD-10-CM | POA: Diagnosis not present

## 2021-07-19 DIAGNOSIS — M150 Primary generalized (osteo)arthritis: Secondary | ICD-10-CM | POA: Diagnosis not present

## 2021-07-19 DIAGNOSIS — R768 Other specified abnormal immunological findings in serum: Secondary | ICD-10-CM | POA: Diagnosis not present

## 2021-07-19 DIAGNOSIS — M0609 Rheumatoid arthritis without rheumatoid factor, multiple sites: Secondary | ICD-10-CM | POA: Diagnosis not present

## 2021-07-19 DIAGNOSIS — G5603 Carpal tunnel syndrome, bilateral upper limbs: Secondary | ICD-10-CM | POA: Diagnosis not present

## 2021-07-24 DIAGNOSIS — E118 Type 2 diabetes mellitus with unspecified complications: Secondary | ICD-10-CM | POA: Diagnosis not present

## 2021-07-24 DIAGNOSIS — I10 Essential (primary) hypertension: Secondary | ICD-10-CM | POA: Diagnosis not present

## 2021-07-24 DIAGNOSIS — E039 Hypothyroidism, unspecified: Secondary | ICD-10-CM | POA: Diagnosis not present

## 2021-07-24 DIAGNOSIS — E785 Hyperlipidemia, unspecified: Secondary | ICD-10-CM | POA: Diagnosis not present

## 2021-07-24 DIAGNOSIS — Z79899 Other long term (current) drug therapy: Secondary | ICD-10-CM | POA: Diagnosis not present

## 2021-07-27 ENCOUNTER — Other Ambulatory Visit: Payer: Self-pay | Admitting: Neurological Surgery

## 2021-07-27 ENCOUNTER — Other Ambulatory Visit (HOSPITAL_COMMUNITY): Payer: Self-pay | Admitting: Neurological Surgery

## 2021-07-27 DIAGNOSIS — M4802 Spinal stenosis, cervical region: Secondary | ICD-10-CM

## 2021-07-27 DIAGNOSIS — M5481 Occipital neuralgia: Secondary | ICD-10-CM | POA: Diagnosis not present

## 2021-07-30 DIAGNOSIS — E039 Hypothyroidism, unspecified: Secondary | ICD-10-CM | POA: Diagnosis not present

## 2021-07-30 DIAGNOSIS — R7309 Other abnormal glucose: Secondary | ICD-10-CM | POA: Diagnosis not present

## 2021-07-30 DIAGNOSIS — E785 Hyperlipidemia, unspecified: Secondary | ICD-10-CM | POA: Diagnosis not present

## 2021-07-30 DIAGNOSIS — E1169 Type 2 diabetes mellitus with other specified complication: Secondary | ICD-10-CM | POA: Diagnosis not present

## 2021-07-30 DIAGNOSIS — R609 Edema, unspecified: Secondary | ICD-10-CM | POA: Diagnosis not present

## 2021-08-06 DIAGNOSIS — M5481 Occipital neuralgia: Secondary | ICD-10-CM | POA: Diagnosis not present

## 2021-08-09 ENCOUNTER — Other Ambulatory Visit (HOSPITAL_COMMUNITY): Payer: Medicare Other

## 2021-08-15 ENCOUNTER — Ambulatory Visit (HOSPITAL_COMMUNITY): Payer: Medicare Other

## 2021-08-22 ENCOUNTER — Encounter (HOSPITAL_COMMUNITY): Payer: Self-pay

## 2021-08-22 ENCOUNTER — Ambulatory Visit (HOSPITAL_COMMUNITY): Payer: Medicare Other

## 2021-08-28 ENCOUNTER — Other Ambulatory Visit: Payer: Self-pay | Admitting: Orthopaedic Surgery

## 2021-08-28 DIAGNOSIS — M25512 Pain in left shoulder: Secondary | ICD-10-CM

## 2021-09-11 ENCOUNTER — Ambulatory Visit (HOSPITAL_COMMUNITY)
Admission: RE | Admit: 2021-09-11 | Discharge: 2021-09-11 | Disposition: A | Discharge status: Home / Self Care | Payer: Medicare Other | Source: Ambulatory Visit | Attending: Neurological Surgery | Admitting: Neurological Surgery

## 2021-09-11 ENCOUNTER — Other Ambulatory Visit: Payer: Self-pay

## 2021-09-11 DIAGNOSIS — R200 Anesthesia of skin: Secondary | ICD-10-CM | POA: Diagnosis not present

## 2021-09-11 DIAGNOSIS — M4802 Spinal stenosis, cervical region: Secondary | ICD-10-CM | POA: Diagnosis not present

## 2021-09-21 DIAGNOSIS — E785 Hyperlipidemia, unspecified: Secondary | ICD-10-CM | POA: Diagnosis not present

## 2021-09-21 DIAGNOSIS — I10 Essential (primary) hypertension: Secondary | ICD-10-CM | POA: Diagnosis not present

## 2021-09-21 DIAGNOSIS — E1169 Type 2 diabetes mellitus with other specified complication: Secondary | ICD-10-CM | POA: Diagnosis not present

## 2021-09-24 DIAGNOSIS — H2512 Age-related nuclear cataract, left eye: Secondary | ICD-10-CM | POA: Diagnosis not present

## 2021-09-24 DIAGNOSIS — H25811 Combined forms of age-related cataract, right eye: Secondary | ICD-10-CM | POA: Diagnosis not present

## 2021-09-24 DIAGNOSIS — H01001 Unspecified blepharitis right upper eyelid: Secondary | ICD-10-CM | POA: Diagnosis not present

## 2021-09-24 DIAGNOSIS — E119 Type 2 diabetes mellitus without complications: Secondary | ICD-10-CM | POA: Diagnosis not present

## 2021-09-24 DIAGNOSIS — H01002 Unspecified blepharitis right lower eyelid: Secondary | ICD-10-CM | POA: Diagnosis not present

## 2021-10-04 DIAGNOSIS — G959 Disease of spinal cord, unspecified: Secondary | ICD-10-CM | POA: Diagnosis not present

## 2021-10-04 DIAGNOSIS — M4802 Spinal stenosis, cervical region: Secondary | ICD-10-CM | POA: Diagnosis not present

## 2021-11-01 ENCOUNTER — Encounter (HOSPITAL_COMMUNITY)
Admission: RE | Admit: 2021-11-01 | Discharge: 2021-11-01 | Disposition: A | Discharge status: Home / Self Care | Payer: Medicare Other | Source: Ambulatory Visit | Attending: Ophthalmology | Admitting: Ophthalmology

## 2021-11-01 DIAGNOSIS — H25811 Combined forms of age-related cataract, right eye: Secondary | ICD-10-CM | POA: Diagnosis not present

## 2021-11-05 ENCOUNTER — Other Ambulatory Visit: Payer: Self-pay

## 2021-11-05 ENCOUNTER — Encounter (HOSPITAL_COMMUNITY): Payer: Self-pay

## 2021-11-05 NOTE — H&P (Signed)
Surgical History & Physical ? ?Patient Name: Monique Weiss DOB: 1955-09-16 ? ?Surgery: Cataract extraction with intraocular lens implant phacoemulsification; Right Eye ? ?Surgeon: Baruch Goldmann MD ?Surgery Date:  11-09-21 ?Pre-Op Date:  11-01-21 ? ?HPI: ?A 34 Yr. old female patient pt referred from Dr. Jorja Loa for a cataract evaluation of both eyes. pt states that she struggles seeing clearly at all ranges, specifically in the distance. pt gets some relief when wearing glasses. pt states that she still drives at night on occasion but struggles with glare from other headlights. This is negatively affecting the patient's quality of life and the patient is unable to function adequately in life with the current level of vision. pt has mild irritation and tearing OU from allergies.pt has restasis that she uses prn OU. A1C: 2 months ago 5.9 HPI was performed by Baruch Goldmann . ? ?Medical History: ?Dry Eyes ?Cataracts ?Ptosis Blepharoplasty ?Cataracts ?Arthritis ?Diabetes - DM Type 2 ?High Blood Pressure ?LDL ?arthritis swelling causing pressure to certain par... ? ?Review of Systems ?Negative Allergic/Immunologic ?Negative Cardiovascular ?Negative Constitutional ?Negative Ear, Nose, Mouth & Throat ?Negative Endocrine ?Negative Eyes ?Negative Gastrointestinal ?Negative Genitourinary ?Negative Hemotologic/Lymphatic ?Negative Integumentary ?Negative Musculoskeletal ?Negative Neurological ?Negative Psychiatry ?Negative Respiratory ? ?Social ?  Never smoked  ?  Alcohol Former Drinker ? ?Medication ?Restasis, Metformin, Sertraline, Spironolactone, Furosemide, Levothyroxine Sodium, Arthritis shot once a week,  ? ?Sx/Procedures ?Back Surgery, Knee Replacement, Gallbladder Removal,  ? ?Drug Allergies  ? NKDA ? ?History & Physical: ?Heent: Cataract, Right Eye ?NECK: supple without bruits ?LUNGS: lungs clear to auscultation ?CV: regular rate and rhythm ?Abdomen: soft and non-tender ? ?Impression & Plan: ?Assessment: ?1.  COMBINED  FORMS AGE RELATED CATARACT; Right Eye (H25.811) ?2.  Diabetes Type 2 No retinopathy (E11.9) ?3.  BLEPHARITIS; Right Upper Lid, Right Lower Lid, Left Upper Lid, Left Lower Lid (H01.001, H01.002,H01.004,H01.005) ?4.  DERMATOCHALASIS, no surgery; Right Upper Lid, Left Upper Lid (H96.222, L79.892) ?5.  NUCLEAR SCLEROSIS AGE RELATED; Left Eye (H25.12) ?6.  ASTIGMATISM, REGULAR; Both Eyes (H52.223) ? ?Plan: 1.  Cataract accounts for the patient's decreased vision. This visual impairment is not correctable with a tolerable change in glasses or contact lenses. Cataract surgery with an implantation of a new lens should significantly improve the visual and functional status of the patient. Discussed all risks, benefits, alternatives, and potential complications. Discussed the procedures and recovery. Patient desires to have surgery. A-scan ordered and performed today for intra-ocular lens calculations. The surgery will be performed in order to improve vision for driving, reading, and for eye examinations. Recommend phacoemulsification with intra-ocular lens. Recommend Dextenza for post-operative pain and inflammation. ?Right Eye. ?Dilates well - shugarcaine by protocol. ?Toric Lens - Eyhance or Vivity. ? ?2.  Stressed importance of blood sugar and blood pressure control, and also yearly eye examinations. ?Discussed the need for ongoing proactive ocular exams and treatment, hopefully before visual symptoms develop. ? ?3.  Recommend regular lid cleaning. ? ?4.  Asymptomatic, recommend observation for now. Findings, prognosis and treatment options reviewed. ? ?5.  Will address after right eye. ? ?6.  Recommend Toric IOL OU. ?

## 2021-11-07 DIAGNOSIS — M25512 Pain in left shoulder: Secondary | ICD-10-CM | POA: Diagnosis not present

## 2021-11-09 ENCOUNTER — Encounter (HOSPITAL_COMMUNITY)
Admission: RE | Disposition: A | Discharge status: Home / Self Care | Payer: Self-pay | Source: Home / Self Care | Attending: Ophthalmology

## 2021-11-09 ENCOUNTER — Encounter (HOSPITAL_COMMUNITY): Payer: Self-pay | Admitting: Ophthalmology

## 2021-11-09 ENCOUNTER — Ambulatory Visit (HOSPITAL_COMMUNITY): Payer: Medicare Other | Admitting: Anesthesiology

## 2021-11-09 ENCOUNTER — Ambulatory Visit (HOSPITAL_BASED_OUTPATIENT_CLINIC_OR_DEPARTMENT_OTHER): Payer: Medicare Other | Admitting: Anesthesiology

## 2021-11-09 ENCOUNTER — Ambulatory Visit (HOSPITAL_COMMUNITY)
Admission: RE | Admit: 2021-11-09 | Discharge: 2021-11-09 | Disposition: A | Discharge status: Home / Self Care | Payer: Medicare Other | Attending: Ophthalmology | Admitting: Ophthalmology

## 2021-11-09 DIAGNOSIS — E039 Hypothyroidism, unspecified: Secondary | ICD-10-CM | POA: Insufficient documentation

## 2021-11-09 DIAGNOSIS — G9589 Other specified diseases of spinal cord: Secondary | ICD-10-CM | POA: Insufficient documentation

## 2021-11-09 DIAGNOSIS — H52203 Unspecified astigmatism, bilateral: Secondary | ICD-10-CM | POA: Insufficient documentation

## 2021-11-09 DIAGNOSIS — G4730 Sleep apnea, unspecified: Secondary | ICD-10-CM | POA: Diagnosis not present

## 2021-11-09 DIAGNOSIS — E119 Type 2 diabetes mellitus without complications: Secondary | ICD-10-CM | POA: Diagnosis not present

## 2021-11-09 DIAGNOSIS — Z79899 Other long term (current) drug therapy: Secondary | ICD-10-CM | POA: Diagnosis not present

## 2021-11-09 DIAGNOSIS — E1136 Type 2 diabetes mellitus with diabetic cataract: Secondary | ICD-10-CM | POA: Diagnosis not present

## 2021-11-09 DIAGNOSIS — H25811 Combined forms of age-related cataract, right eye: Secondary | ICD-10-CM

## 2021-11-09 DIAGNOSIS — I10 Essential (primary) hypertension: Secondary | ICD-10-CM | POA: Diagnosis not present

## 2021-11-09 DIAGNOSIS — H0100B Unspecified blepharitis left eye, upper and lower eyelids: Secondary | ICD-10-CM | POA: Insufficient documentation

## 2021-11-09 DIAGNOSIS — H0100A Unspecified blepharitis right eye, upper and lower eyelids: Secondary | ICD-10-CM | POA: Insufficient documentation

## 2021-11-09 DIAGNOSIS — Z7984 Long term (current) use of oral hypoglycemic drugs: Secondary | ICD-10-CM

## 2021-11-09 DIAGNOSIS — M1990 Unspecified osteoarthritis, unspecified site: Secondary | ICD-10-CM | POA: Diagnosis not present

## 2021-11-09 DIAGNOSIS — M069 Rheumatoid arthritis, unspecified: Secondary | ICD-10-CM | POA: Insufficient documentation

## 2021-11-09 HISTORY — PX: CATARACT EXTRACTION W/PHACO: SHX586

## 2021-11-09 LAB — GLUCOSE, CAPILLARY: Glucose-Capillary: 119 mg/dL — ABNORMAL HIGH (ref 70–99)

## 2021-11-09 SURGERY — PHACOEMULSIFICATION, CATARACT, WITH IOL INSERTION
Anesthesia: Monitor Anesthesia Care | Site: Eye | Laterality: Right

## 2021-11-09 MED ORDER — SODIUM HYALURONATE 10 MG/ML IO SOLUTION
PREFILLED_SYRINGE | INTRAOCULAR | Status: DC | PRN
  Administered 2021-11-09: 0.85 mL via INTRAOCULAR

## 2021-11-09 MED ORDER — PHENYLEPHRINE HCL 2.5 % OP SOLN
1.0000 [drp] | OPHTHALMIC | Status: AC | PRN
  Administered 2021-11-09 (×3): 1 [drp] via OPHTHALMIC

## 2021-11-09 MED ORDER — BSS IO SOLN
INTRAOCULAR | Status: DC | PRN
  Administered 2021-11-09: 15 mL via INTRAOCULAR

## 2021-11-09 MED ORDER — NEOMYCIN-POLYMYXIN-DEXAMETH 3.5-10000-0.1 OP SUSP
OPHTHALMIC | Status: DC | PRN
  Administered 2021-11-09: 1 [drp] via OPHTHALMIC

## 2021-11-09 MED ORDER — EPINEPHRINE PF 1 MG/ML IJ SOLN
INTRAOCULAR | Status: DC | PRN
  Administered 2021-11-09: 500 mL

## 2021-11-09 MED ORDER — SODIUM HYALURONATE 23MG/ML IO SOSY
PREFILLED_SYRINGE | INTRAOCULAR | Status: DC | PRN
Start: 2021-11-09 — End: 2021-11-09
  Administered 2021-11-09: 0.6 mL via INTRAOCULAR

## 2021-11-09 MED ORDER — STERILE WATER FOR IRRIGATION IR SOLN
Status: DC | PRN
  Administered 2021-11-09: 250 mL

## 2021-11-09 MED ORDER — LIDOCAINE HCL (PF) 1 % IJ SOLN
INTRAOCULAR | Status: DC | PRN
  Administered 2021-11-09: 1 mL via OPHTHALMIC

## 2021-11-09 MED ORDER — TETRACAINE HCL 0.5 % OP SOLN
1.0000 [drp] | OPHTHALMIC | Status: AC | PRN
  Administered 2021-11-09 (×3): 1 [drp] via OPHTHALMIC

## 2021-11-09 MED ORDER — POVIDONE-IODINE 5 % OP SOLN
OPHTHALMIC | Status: DC | PRN
  Administered 2021-11-09: 1 via OPHTHALMIC

## 2021-11-09 MED ORDER — TROPICAMIDE 1 % OP SOLN
1.0000 [drp] | OPHTHALMIC | Status: AC | PRN
  Administered 2021-11-09 (×3): 1 [drp] via OPHTHALMIC

## 2021-11-09 MED ORDER — LIDOCAINE HCL 3.5 % OP GEL
1.0000 "application " | Freq: Once | OPHTHALMIC | Status: AC
  Administered 2021-11-09: 1 via OPHTHALMIC

## 2021-11-09 SURGICAL SUPPLY — 14 items
CATARACT SUITE SIGHTPATH (MISCELLANEOUS) ×2 IMPLANT
CLOTH BEACON ORANGE TIMEOUT ST (SAFETY) ×2 IMPLANT
EYE SHIELD UNIVERSAL CLEAR (GAUZE/BANDAGES/DRESSINGS) ×1 IMPLANT
FEE CATARACT SUITE SIGHTPATH (MISCELLANEOUS) ×1 IMPLANT
GLOVE BIOGEL PI IND STRL 7.0 (GLOVE) ×2 IMPLANT
GLOVE BIOGEL PI INDICATOR 7.0 (GLOVE) ×1
GLOVE SURG SS PI 7.0 STRL IVOR (GLOVE) ×1 IMPLANT
LENS IOL RAYNER 15.0 (Intraocular Lens) ×2 IMPLANT
LENS IOL RAYONE EMV 15.0 (Intraocular Lens) IMPLANT
PAD ARMBOARD 7.5X6 YLW CONV (MISCELLANEOUS) ×2 IMPLANT
SYR TB 1ML LL NO SAFETY (SYRINGE) ×2 IMPLANT
TAPE SURG TRANSPORE 1 IN (GAUZE/BANDAGES/DRESSINGS) IMPLANT
TAPE SURGICAL TRANSPORE 1 IN (GAUZE/BANDAGES/DRESSINGS) ×2
WATER STERILE IRR 250ML POUR (IV SOLUTION) ×2 IMPLANT

## 2021-11-09 NOTE — Op Note (Signed)
Date of procedure: 11/09/21  Pre-operative diagnosis:  Visually significant combined form age-related cataract, Right Eye (H25.811)  Post-operative diagnosis:  Visually significant combined form age-related cataract, Right Eye (H25.811)  Procedure: Removal of cataract via phacoemulsification and insertion of intra-ocular lens Rayner [id number] +15.0D into the capsular bag of the Right Eye  Attending surgeon: Gerda Diss. Jeremyah Jelley, MD, MA  Anesthesia: MAC, Topical Akten  Complications: None  Estimated Blood Loss: <46m (minimal)  Specimens: None  Implants: As above  Indications:  Visually significant age-related cataract, Right Eye  Procedure:  The patient was seen and identified in the pre-operative area. The operative eye was identified and dilated.  The operative eye was marked.  Topical anesthesia was administered to the operative eye.     The patient was then to the operative suite and placed in the supine position.  A timeout was performed confirming the patient, procedure to be performed, and all other relevant information.   The patient's face was prepped and draped in the usual fashion for intra-ocular surgery.  A lid speculum was placed into the operative eye and the surgical microscope moved into place and focused.  A superotemporal paracentesis was created using a 20 gauge paracentesis blade.  Shugarcaine was injected into the anterior chamber.  Viscoelastic was injected into the anterior chamber.  A temporal clear-corneal main wound incision was created using a 2.449mmicrokeratome.  A continuous curvilinear capsulorrhexis was initiated using an irrigating cystitome and completed using capsulorrhexis forceps.  Hydrodissection and hydrodeliniation were performed.  Viscoelastic was injected into the anterior chamber.  A phacoemulsification handpiece and a chopper as a second instrument were used to remove the nucleus and epinucleus. The irrigation/aspiration handpiece was used to remove any  remaining cortical material.   The capsular bag was reinflated with viscoelastic, checked, and found to be intact.  The intraocular lens was inserted into the capsular bag.  The irrigation/aspiration handpiece was used to remove any remaining viscoelastic.  The clear corneal wound and paracentesis wounds were then hydrated and checked with Weck-Cels to be watertight.  The lid-speculum was removed.  The drape was removed.  The patient's face was cleaned with a wet and dry 4x4.   Maxitrol was instilled in the eye. A clear shield was taped over the eye. The patient was taken to the post-operative care unit in good condition, having tolerated the procedure well.  Post-Op Instructions: [facility] and will receive all other orders and instructions.

## 2021-11-09 NOTE — Anesthesia Preprocedure Evaluation (Addendum)
Anesthesia Evaluation  Patient identified by MRN, date of birth, ID band Patient awake    Reviewed: Allergy & Precautions, NPO status , Patient's Chart, lab work & pertinent test results  History of Anesthesia Complications (+) PONV, PROLONGED EMERGENCE and history of anesthetic complications  Airway Mallampati: III  TM Distance: >3 FB Neck ROM: Full    Dental  (+) Dental Advisory Given, Missing   Pulmonary sleep apnea ,    Pulmonary exam normal breath sounds clear to auscultation       Cardiovascular Exercise Tolerance: Good hypertension, Pt. on medications Normal cardiovascular exam Rhythm:Regular Rate:Normal     Neuro/Psych  Neuromuscular disease (cervical myeolopathy) negative psych ROS   GI/Hepatic Neg liver ROS, GERD  Medicated and Controlled,  Endo/Other  diabetes, Well Controlled, Type 2, Oral Hypoglycemic AgentsHypothyroidism   Renal/GU negative Renal ROS  negative genitourinary   Musculoskeletal  (+) Arthritis , Osteoarthritis and Rheumatoid disorders,    Abdominal   Peds negative pediatric ROS (+)  Hematology negative hematology ROS (+)   Anesthesia Other Findings   Reproductive/Obstetrics negative OB ROS                             Anesthesia Physical Anesthesia Plan  ASA: 3  Anesthesia Plan: MAC   Post-op Pain Management: Minimal or no pain anticipated   Induction: Intravenous  PONV Risk Score and Plan:   Airway Management Planned: Nasal Cannula and Natural Airway  Additional Equipment:   Intra-op Plan:   Post-operative Plan:   Informed Consent: I have reviewed the patients History and Physical, chart, labs and discussed the procedure including the risks, benefits and alternatives for the proposed anesthesia with the patient or authorized representative who has indicated his/her understanding and acceptance.     Dental advisory given  Plan Discussed with:  CRNA and Surgeon  Anesthesia Plan Comments:        Anesthesia Quick Evaluation

## 2021-11-09 NOTE — Anesthesia Postprocedure Evaluation (Signed)
Anesthesia Post Note  Patient: Monique Weiss  Procedure(s) Performed: CATARACT EXTRACTION PHACO AND INTRAOCULAR LENS PLACEMENT (IOC) (Right: Eye)  Patient location during evaluation: Phase II Anesthesia Type: MAC Level of consciousness: awake and alert and oriented Pain management: pain level controlled Vital Signs Assessment: post-procedure vital signs reviewed and stable Respiratory status: spontaneous breathing, nonlabored ventilation and respiratory function stable Cardiovascular status: stable and blood pressure returned to baseline Postop Assessment: no apparent nausea or vomiting Anesthetic complications: no   No notable events documented.   Last Vitals:  Vitals:   11/09/21 0817 11/09/21 0952  BP: 117/75 133/74  Pulse: (!) 57 61  Resp: 13 14  Temp: 36.5 C   SpO2: 96% 98%    Last Pain:  Vitals:   11/09/21 0952  TempSrc:   PainSc: 0-No pain                 Herold Salguero C Shaivi Rothschild

## 2021-11-09 NOTE — Interval H&P Note (Signed)
History and Physical Interval Note:  11/09/2021 9:24 AM  Monique Weiss  has presented today for surgery, with the diagnosis of combined forms age related cataract; right eye.  The various methods of treatment have been discussed with the patient and family. After consideration of risks, benefits and other options for treatment, the patient has consented to  Procedure(s) with comments: CATARACT EXTRACTION PHACO AND INTRAOCULAR LENS PLACEMENT (IOC) (Right) - right as a surgical intervention.  The patient's history has been reviewed, patient examined, no change in status, stable for surgery.  I have reviewed the patient's chart and labs.  Questions were answered to the patient's satisfaction.     Baruch Goldmann

## 2021-11-09 NOTE — Transfer of Care (Signed)
Immediate Anesthesia Transfer of Care Note  Patient: Monique Weiss  Procedure(s) Performed: CATARACT EXTRACTION PHACO AND INTRAOCULAR LENS PLACEMENT (IOC) (Right: Eye)  Patient Location: Short Stay  Anesthesia Type:MAC  Level of Consciousness: awake, alert  and oriented  Airway & Oxygen Therapy: Patient Spontanous Breathing  Post-op Assessment: Report given to RN and Post -op Vital signs reviewed and stable  Post vital signs: Reviewed and stable  Last Vitals:  Vitals Value Taken Time  BP 133/74 11/09/21 0952  Temp    Pulse 61 11/09/21 0952  Resp 14 11/09/21 0952  SpO2 98 % 11/09/21 0952    Last Pain:  Vitals:   11/09/21 0952  TempSrc:   PainSc: 0-No pain      Patients Stated Pain Goal: 5 (94/58/59 2924)  Complications: No notable events documented.

## 2021-11-09 NOTE — Discharge Instructions (Signed)
Please discharge patient when stable, will follow up today with Dr. Virat Prather at the [facility] [place] office immediately following discharge.  Leave shield in place until visit.  All paperwork with discharge instructions will be given at the office.  [facility]  [place] Address:  [address]  [place], [place] [address]  

## 2021-11-12 ENCOUNTER — Encounter (HOSPITAL_COMMUNITY): Payer: Self-pay | Admitting: Ophthalmology

## 2021-11-12 DIAGNOSIS — E785 Hyperlipidemia, unspecified: Secondary | ICD-10-CM | POA: Diagnosis not present

## 2021-11-12 DIAGNOSIS — E039 Hypothyroidism, unspecified: Secondary | ICD-10-CM | POA: Diagnosis not present

## 2021-11-12 DIAGNOSIS — E118 Type 2 diabetes mellitus with unspecified complications: Secondary | ICD-10-CM | POA: Diagnosis not present

## 2021-11-12 DIAGNOSIS — Z79899 Other long term (current) drug therapy: Secondary | ICD-10-CM | POA: Diagnosis not present

## 2021-11-26 DIAGNOSIS — H2512 Age-related nuclear cataract, left eye: Secondary | ICD-10-CM | POA: Diagnosis not present

## 2021-11-27 ENCOUNTER — Encounter (HOSPITAL_COMMUNITY)
Admission: RE | Admit: 2021-11-27 | Discharge: 2021-11-27 | Disposition: A | Discharge status: Home / Self Care | Payer: Medicare Other | Source: Ambulatory Visit | Attending: Ophthalmology | Admitting: Ophthalmology

## 2021-11-28 ENCOUNTER — Encounter (HOSPITAL_COMMUNITY): Payer: Self-pay

## 2021-11-29 NOTE — H&P (Signed)
Surgical History & Physical  Patient Name: Monique Weiss DOB: Jul 18, 1955  Surgery: Cataract extraction with intraocular lens implant phacoemulsification; Left Eye  Surgeon: Baruch Goldmann MD Surgery Date:  12-03-21 Pre-Op Date:  11-15-21  HPI: A 42 Yr. old female patient 1. The patient is returning after cataract post-op. The right eye is affected. Status post cataract post-op, 1 week ago: Since the last visit, the affected area is doing well. The patient's vision is stable. Patient is following medication instructions. pt states that she struggles seeing clearly at all ranges, specifically in the distance in left eye. pt gets some relief when wearing glasses. pt states that she still drives at night on occasion but struggles with glare from other headlights. This is negatively affecting the patient's quality of life and the patient is unable to function adequately in life with the current level of vision HPI was performed by Baruch Goldmann .  Medical History: Dry Eyes Cataracts Ptosis Blepharoplasty Cataracts Arthritis Diabetes - DM Type 2 High Blood Pressure LDL arthritis swelling causing pressure to certain par...  Review of Systems Negative Allergic/Immunologic Negative Cardiovascular Negative Constitutional Negative Ear, Nose, Mouth & Throat Negative Endocrine Negative Eyes Negative Gastrointestinal Negative Genitourinary Negative Hemotologic/Lymphatic Negative Integumentary Negative Musculoskeletal Negative Neurological Negative Psychiatry Negative Respiratory  Social   Never smoked    Alcohol Former Drinker  Medication Restasis, Prednisolone-Moxifloxacin-Bromfenac,  Metformin, Sertraline, Spironolactone, Furosemide, Levothyroxine Sodium, Arthritis shot once a week,   Sx/Procedures Phaco c IOL OD,  Back Surgery, Knee Replacement, Gallbladder Removal,   Drug Allergies   NKDA  History & Physical: Heent: Cataract, Left eye NECK: supple without bruits LUNGS:  lungs clear to auscultation CV: regular rate and rhythm Abdomen: soft and non-tender  Impression & Plan: Assessment: 1.  CATARACT EXTRACTION STATUS; Right Eye (Z98.41) 2.  NUCLEAR SCLEROSIS AGE RELATED; Left Eye (H25.12) 3.  Myopia ; Left Eye (H52.12)  Plan: 1.  1 week after cataract surgery. Doing well with improved vision and normal eye pressure. Call with any problems or concerns. Continue Pred-Moxi-Brom 2x/day for 3 more weeks.  2.  Cataract accounts for the patient's decreased vision. This visual impairment is not correctable with a tolerable change in glasses or contact lenses. Cataract surgery with an implantation of a new lens should significantly improve the visual and functional status of the patient. Discussed all risks, benefits, alternatives, and potential complications. Discussed the procedures and recovery. Patient desires to have surgery. A-scan ordered and performed today for intra-ocular lens calculations. The surgery will be performed in order to improve vision for driving, reading, and for eye examinations. Recommend phacoemulsification with intra-ocular lens. Recommend Dextenza for post-operative pain and inflammation. Left Eye. Surgery required to correct imbalance of vision. Dilates well - shugarcaine by protocol.  3.

## 2021-12-03 ENCOUNTER — Ambulatory Visit (HOSPITAL_COMMUNITY): Payer: Medicare Other | Admitting: Anesthesiology

## 2021-12-03 ENCOUNTER — Encounter (HOSPITAL_COMMUNITY)
Admission: RE | Disposition: A | Discharge status: Home / Self Care | Payer: Self-pay | Source: Home / Self Care | Attending: Ophthalmology

## 2021-12-03 ENCOUNTER — Encounter (HOSPITAL_COMMUNITY): Payer: Self-pay | Admitting: Ophthalmology

## 2021-12-03 ENCOUNTER — Ambulatory Visit (HOSPITAL_COMMUNITY)
Admission: RE | Admit: 2021-12-03 | Discharge: 2021-12-03 | Disposition: A | Discharge status: Home / Self Care | Payer: Medicare Other | Attending: Ophthalmology | Admitting: Ophthalmology

## 2021-12-03 ENCOUNTER — Ambulatory Visit (HOSPITAL_BASED_OUTPATIENT_CLINIC_OR_DEPARTMENT_OTHER): Payer: Medicare Other | Admitting: Anesthesiology

## 2021-12-03 DIAGNOSIS — G4730 Sleep apnea, unspecified: Secondary | ICD-10-CM | POA: Diagnosis not present

## 2021-12-03 DIAGNOSIS — Z9841 Cataract extraction status, right eye: Secondary | ICD-10-CM | POA: Insufficient documentation

## 2021-12-03 DIAGNOSIS — H2512 Age-related nuclear cataract, left eye: Secondary | ICD-10-CM | POA: Diagnosis not present

## 2021-12-03 DIAGNOSIS — H5212 Myopia, left eye: Secondary | ICD-10-CM | POA: Diagnosis not present

## 2021-12-03 DIAGNOSIS — Z7984 Long term (current) use of oral hypoglycemic drugs: Secondary | ICD-10-CM | POA: Diagnosis not present

## 2021-12-03 DIAGNOSIS — I10 Essential (primary) hypertension: Secondary | ICD-10-CM | POA: Diagnosis not present

## 2021-12-03 DIAGNOSIS — H2181 Floppy iris syndrome: Secondary | ICD-10-CM | POA: Insufficient documentation

## 2021-12-03 DIAGNOSIS — E119 Type 2 diabetes mellitus without complications: Secondary | ICD-10-CM | POA: Diagnosis not present

## 2021-12-03 DIAGNOSIS — E1136 Type 2 diabetes mellitus with diabetic cataract: Secondary | ICD-10-CM | POA: Insufficient documentation

## 2021-12-03 HISTORY — PX: CATARACT EXTRACTION W/PHACO: SHX586

## 2021-12-03 LAB — GLUCOSE, CAPILLARY: Glucose-Capillary: 112 mg/dL — ABNORMAL HIGH (ref 70–99)

## 2021-12-03 SURGERY — PHACOEMULSIFICATION, CATARACT, WITH IOL INSERTION
Anesthesia: Monitor Anesthesia Care | Site: Eye | Laterality: Left

## 2021-12-03 MED ORDER — EPINEPHRINE PF 1 MG/ML IJ SOLN
INTRAOCULAR | Status: DC | PRN
  Administered 2021-12-03: 500 mL

## 2021-12-03 MED ORDER — SODIUM HYALURONATE 23MG/ML IO SOSY
PREFILLED_SYRINGE | INTRAOCULAR | Status: DC | PRN
  Administered 2021-12-03: 0.6 mL via INTRAOCULAR

## 2021-12-03 MED ORDER — TROPICAMIDE 1 % OP SOLN
1.0000 [drp] | OPHTHALMIC | Status: AC | PRN
  Administered 2021-12-03 (×3): 1 [drp] via OPHTHALMIC

## 2021-12-03 MED ORDER — LIDOCAINE HCL (PF) 1 % IJ SOLN
INTRAOCULAR | Status: DC | PRN
  Administered 2021-12-03: 1 mL via OPHTHALMIC

## 2021-12-03 MED ORDER — PHENYLEPHRINE HCL 2.5 % OP SOLN
1.0000 [drp] | OPHTHALMIC | Status: AC | PRN
  Administered 2021-12-03 (×3): 1 [drp] via OPHTHALMIC

## 2021-12-03 MED ORDER — BSS IO SOLN
INTRAOCULAR | Status: DC | PRN
  Administered 2021-12-03: 15 mL via INTRAOCULAR

## 2021-12-03 MED ORDER — LIDOCAINE HCL 3.5 % OP GEL
1.0000 "application " | Freq: Once | OPHTHALMIC | Status: AC
  Administered 2021-12-03: 1 via OPHTHALMIC

## 2021-12-03 MED ORDER — TETRACAINE HCL 0.5 % OP SOLN
1.0000 [drp] | OPHTHALMIC | Status: AC | PRN
  Administered 2021-12-03 (×3): 1 [drp] via OPHTHALMIC

## 2021-12-03 MED ORDER — POVIDONE-IODINE 5 % OP SOLN
OPHTHALMIC | Status: DC | PRN
  Administered 2021-12-03: 1 via OPHTHALMIC

## 2021-12-03 MED ORDER — EPINEPHRINE PF 1 MG/ML IJ SOLN
INTRAMUSCULAR | Status: AC
  Filled 2021-12-03: qty 2

## 2021-12-03 MED ORDER — STERILE WATER FOR IRRIGATION IR SOLN
Status: DC | PRN
  Administered 2021-12-03: 250 mL

## 2021-12-03 MED ORDER — NEOMYCIN-POLYMYXIN-DEXAMETH 3.5-10000-0.1 OP SUSP
OPHTHALMIC | Status: DC | PRN
  Administered 2021-12-03: 1 [drp] via OPHTHALMIC

## 2021-12-03 MED ORDER — SODIUM HYALURONATE 10 MG/ML IO SOLUTION
PREFILLED_SYRINGE | INTRAOCULAR | Status: DC | PRN
  Administered 2021-12-03: 0.85 mL via INTRAOCULAR

## 2021-12-03 SURGICAL SUPPLY — 14 items
CATARACT SUITE SIGHTPATH (MISCELLANEOUS) ×2 IMPLANT
CLOTH BEACON ORANGE TIMEOUT ST (SAFETY) ×2 IMPLANT
EYE SHIELD UNIVERSAL CLEAR (GAUZE/BANDAGES/DRESSINGS) ×1 IMPLANT
FEE CATARACT SUITE SIGHTPATH (MISCELLANEOUS) ×1 IMPLANT
GLOVE BIOGEL PI IND STRL 7.0 (GLOVE) ×2 IMPLANT
GLOVE BIOGEL PI INDICATOR 7.0 (GLOVE) ×2
LENS IOL RAYNER 16.0 (Intraocular Lens) ×2 IMPLANT
LENS IOL RAYONE EMV 16.0 (Intraocular Lens) IMPLANT
PAD ARMBOARD 7.5X6 YLW CONV (MISCELLANEOUS) ×2 IMPLANT
RING MALYGIN 7.0 (MISCELLANEOUS) ×1 IMPLANT
SYR TB 1ML LL NO SAFETY (SYRINGE) ×2 IMPLANT
TAPE SURG TRANSPORE 1 IN (GAUZE/BANDAGES/DRESSINGS) IMPLANT
TAPE SURGICAL TRANSPORE 1 IN (GAUZE/BANDAGES/DRESSINGS) ×2
WATER STERILE IRR 250ML POUR (IV SOLUTION) ×2 IMPLANT

## 2021-12-03 NOTE — Op Note (Signed)
Date of procedure: 12/03/21  Pre-operative diagnosis: Visually significant age-related nuclear cataract, Left Eye; Poor dilation, Left eye (H25.12)   Post-operative diagnosis: Visually significant age-related cataract, Left Eye; Intra-operative Floppy Iris Syndrome, Left Eye (H21.81)  Procedure: Complex removal of cataract via phacoemulsification and insertion of intra-ocular lens Rayner [id number] +16.0D into the capsular bag of the Left Eye (CPT [phone])  Attending surgeon: Gerda Diss. Aramis Weil, MD, MA  Anesthesia: MAC, Topical Akten  Complications: None  Estimated Blood Loss: <17m (minimal)  Specimens: None  Implants: As above  Indications:  Visually significant cataract, Left Eye  Procedure:  The patient was seen and identified in the pre-operative area. The operative eye was identified and dilated.  The operative eye was marked.  Topical anesthesia was administered to the operative eye.     The patient was then to the operative suite and placed in the supine position.  A timeout was performed confirming the patient, procedure to be performed, and all other relevant information.   The patient's face was prepped and draped in the usual fashion for intra-ocular surgery.  A lid speculum was placed into the operative eye and the surgical microscope moved into place and focused.  Poor dilation of the iris was confirmed.  An inferotemporal paracentesis was created using a 20 gauge paracentesis blade.  Shugarcaine was injected into the anterior chamber.  Viscoelastic was injected into the anterior chamber.  A temporal clear-corneal main wound incision was created using a 2.440mmicrokeratome.  A Malyugin ring was placed.  A continuous curvilinear capsulorrhexis was initiated using an irrigating cystitome and completed using capsulorrhexis forceps.  Hydrodissection and hydrodeliniation were performed.  Viscoelastic was injected into the anterior chamber.  A phacoemulsification handpiece and a chopper as a  second instrument were used to remove the nucleus and epinucleus. The irrigation/aspiration handpiece was used to remove any remaining cortical material.   The capsular bag was reinflated with viscoelastic, checked, and found to be intact.  The intraocular lens was inserted into the capsular bag and dialed into place using a MaSurveyor, mineralsThe Malyugin ring was removed.  The irrigation/aspiration handpiece was used to remove any remaining viscoelastic.  The clear corneal wound and paracentesis wounds were then hydrated and checked with Weck-Cels to be watertight.  The lid-speculum and drape was removed, and the patient's face was cleaned with a wet and dry 4x4.  Maxitrol was instilled in the eye. A clear shield was taped over the eye. The patient was taken to the post-operative care unit in good condition, having tolerated the procedure well.  Post-Op Instructions: [facility] and will receive all other orders and instructions.

## 2021-12-03 NOTE — Transfer of Care (Signed)
Immediate Anesthesia Transfer of Care Note  Patient: Monique Weiss  Procedure(s) Performed: CATARACT EXTRACTION PHACO AND INTRAOCULAR LENS PLACEMENT (IOC) (Left: Eye)  Patient Location: Short Stay  Anesthesia Type:MAC  Level of Consciousness: awake and patient cooperative  Airway & Oxygen Therapy: Patient Spontanous Breathing  Post-op Assessment: Report given to RN and Post -op Vital signs reviewed and stable  Post vital signs: Reviewed and stable  Last Vitals:  Vitals Value Taken Time  BP 126/98 12/03/21 1154  Temp 98.4   Pulse 73 12/03/21 1154  Resp 18 12/03/21 1154  SpO2 95 % 12/03/21 1154    Last Pain:  Vitals:   12/03/21 1154  TempSrc: Oral  PainSc: 0-No pain         Complications: No notable events documented.

## 2021-12-03 NOTE — Discharge Instructions (Signed)
Please discharge patient when stable, will follow up today with Dr. Judah Chevere at the [facility] [place] office immediately following discharge.  Leave shield in place until visit.  All paperwork with discharge instructions will be given at the office.  [facility] Address:  [address]  [place], [place] [address]  

## 2021-12-03 NOTE — Anesthesia Procedure Notes (Signed)
Date/Time: 12/03/2021 11:31 AM  Performed by: Vista Deck, CRNAPre-anesthesia Checklist: Patient identified, Emergency Drugs available, Suction available, Timeout performed and Patient being monitored Patient Re-evaluated:Patient Re-evaluated prior to induction Oxygen Delivery Method: Nasal Cannula

## 2021-12-03 NOTE — Anesthesia Preprocedure Evaluation (Signed)
Anesthesia Evaluation  Patient identified by MRN, date of birth, ID band Patient awake    Reviewed: Allergy & Precautions, NPO status , Patient's Chart, lab work & pertinent test results  History of Anesthesia Complications (+) PONV, PROLONGED EMERGENCE and history of anesthetic complications  Airway Mallampati: III  TM Distance: >3 FB Neck ROM: Full    Dental  (+) Dental Advisory Given, Missing   Pulmonary sleep apnea ,    Pulmonary exam normal breath sounds clear to auscultation       Cardiovascular Exercise Tolerance: Good hypertension, Pt. on medications Normal cardiovascular exam Rhythm:Regular Rate:Normal     Neuro/Psych  Neuromuscular disease (cervical myeolopathy) negative psych ROS   GI/Hepatic Neg liver ROS, GERD  Medicated and Controlled,  Endo/Other  diabetes, Well Controlled, Type 2, Oral Hypoglycemic AgentsHypothyroidism   Renal/GU negative Renal ROS  negative genitourinary   Musculoskeletal  (+) Arthritis , Osteoarthritis and Rheumatoid disorders,    Abdominal   Peds negative pediatric ROS (+)  Hematology negative hematology ROS (+)   Anesthesia Other Findings   Reproductive/Obstetrics negative OB ROS                             Anesthesia Physical Anesthesia Plan  ASA: 3  Anesthesia Plan: MAC   Post-op Pain Management: Minimal or no pain anticipated   Induction: Intravenous  PONV Risk Score and Plan:   Airway Management Planned: Nasal Cannula and Natural Airway  Additional Equipment:   Intra-op Plan:   Post-operative Plan:   Informed Consent: I have reviewed the patients History and Physical, chart, labs and discussed the procedure including the risks, benefits and alternatives for the proposed anesthesia with the patient or authorized representative who has indicated his/her understanding and acceptance.     Dental advisory given  Plan Discussed with:  CRNA and Surgeon  Anesthesia Plan Comments:        Anesthesia Quick Evaluation  

## 2021-12-03 NOTE — Interval H&P Note (Signed)
History and Physical Interval Note:  12/03/2021 11:25 AM  Monique Weiss  has presented today for surgery, with the diagnosis of nuclear sclerosis age related cataract; left.  The various methods of treatment have been discussed with the patient and family. After consideration of risks, benefits and other options for treatment, the patient has consented to  Procedure(s) with comments: CATARACT EXTRACTION PHACO AND INTRAOCULAR LENS PLACEMENT ([place]) (Left) - left as a surgical intervention.  The patient's history has been reviewed, patient examined, no change in status, stable for surgery.  I have reviewed the patient's chart and labs.  Questions were answered to the patient's satisfaction.     Baruch Goldmann

## 2021-12-03 NOTE — Anesthesia Postprocedure Evaluation (Signed)
Anesthesia Post Note  Patient: Monique Weiss  Procedure(s) Performed: CATARACT EXTRACTION PHACO AND INTRAOCULAR LENS PLACEMENT (IOC) (Left: Eye)  Patient location during evaluation: Phase II Anesthesia Type: MAC Level of consciousness: awake Pain management: pain level controlled Vital Signs Assessment: post-procedure vital signs reviewed and stable Respiratory status: spontaneous breathing and respiratory function stable Cardiovascular status: blood pressure returned to baseline and stable Postop Assessment: no headache and no apparent nausea or vomiting Anesthetic complications: no Comments: Late entry   No notable events documented.   Last Vitals:  Vitals:   12/03/21 1055 12/03/21 1154  BP: (!) 142/77 (!) 126/98  Pulse: 70 73  Resp: 16 18  Temp: 36.8 C   SpO2: 96% 95%    Last Pain:  Vitals:   12/03/21 1154  TempSrc: Oral  PainSc: 0-No pain                 [name]

## 2021-12-04 ENCOUNTER — Encounter (HOSPITAL_COMMUNITY): Payer: Self-pay | Admitting: Ophthalmology

## 2021-12-19 DIAGNOSIS — E1169 Type 2 diabetes mellitus with other specified complication: Secondary | ICD-10-CM | POA: Diagnosis not present

## 2021-12-19 DIAGNOSIS — R609 Edema, unspecified: Secondary | ICD-10-CM | POA: Diagnosis not present

## 2021-12-19 DIAGNOSIS — R739 Hyperglycemia, unspecified: Secondary | ICD-10-CM | POA: Diagnosis not present

## 2021-12-19 DIAGNOSIS — M5090 Cervical disc disorder, unspecified, unspecified cervical region: Secondary | ICD-10-CM | POA: Diagnosis not present

## 2021-12-31 ENCOUNTER — Other Ambulatory Visit: Payer: Self-pay | Admitting: Neurological Surgery

## 2022-01-07 NOTE — Progress Notes (Signed)
Surgical Instructions    Your procedure is scheduled on Monday July 24.  Report to [facility] Main Entrance "A" at 10 A.M., then check in with the Admitting office.  Call this number if you have problems the morning of surgery:  [phone]   If you have any questions prior to your surgery date call [phone]: Open Monday-Friday 8am-4pm    Remember:  Do not eat after midnight the night before your surgery  You may drink clear liquids until 9:00am the morning of your surgery.   Clear liquids allowed are: Water, Non-Citrus Juices (without pulp), Carbonated Beverages, Clear Tea, Black Coffee ONLY (NO MILK, CREAM OR POWDERED CREAMER of any kind), and Gatorade    Take these medicines the morning of surgery with A SIP OF WATER:  levothyroxine (SYNTHROID)  nepafenac (ILEVRO)  prednisoLONE acetate (PRED FORTE) sertraline (ZOLOFT)   If needed you may take: acetaminophen (TYLENOL)  gabapentin (NEURONTIN) promethazine (PHENERGAN)   As of today, STOP taking any Aspirin (unless otherwise instructed by your surgeon) Aleve, Naproxen, Ibuprofen, Motrin, Advil, Goody's, BC's, all herbal medications, fish oil, and all vitamins.  WHAT DO I DO ABOUT MY DIABETES MEDICATION?  Do not take oral diabetes medicines (pills) the morning of surgery. DO NOT take metFORMIN (GLUCOPHAGE) the morning of surgery.   HOW TO MANAGE YOUR DIABETES BEFORE AND AFTER SURGERY  Why is it important to control my blood sugar before and after surgery? Improving blood sugar levels before and after surgery helps healing and can limit problems. A way of improving blood sugar control is eating a healthy diet by:  Eating less sugar and carbohydrates  Increasing activity/exercise  Talking with your doctor about reaching your blood sugar goals High blood sugars (greater than 180 mg/dL) can raise your risk of infections and slow your recovery, so you will need to focus on controlling your diabetes during the weeks before  surgery. Make sure that the doctor who takes care of your diabetes knows about your planned surgery including the date and location.  How do I manage my blood sugar before surgery? Check your blood sugar at least 4 times a day, starting 2 days before surgery, to make sure that the level is not too high or low.  Check your blood sugar the morning of your surgery when you wake up and every 2 hours until you get to the Short Stay unit.  If your blood sugar is less than 70 mg/dL, you will need to treat for low blood sugar: Do not take insulin. Treat a low blood sugar (less than 70 mg/dL) with  cup of clear juice (cranberry or apple), 4 glucose tablets, OR glucose gel. Recheck blood sugar in 15 minutes after treatment (to make sure it is greater than 70 mg/dL). If your blood sugar is not greater than 70 mg/dL on recheck, call [phone] for further instructions. Report your blood sugar to the short stay nurse when you get to Short Stay.  If you are admitted to the hospital after surgery: Your blood sugar will be checked by the staff and you will probably be given insulin after surgery (instead of oral diabetes medicines) to make sure you have good blood sugar levels. The goal for blood sugar control after surgery is 80-180 mg/dL.       Do not wear jewelry or makeup Do not wear lotions, powders, perfumes/colognes, or deodorant. Do not shave 48 hours prior to surgery.  Men may shave face and neck. Do not bring valuables to the  hospital. Do not wear nail polish, gel polish, artificial nails, or any other type of covering on natural nails (fingers and toes) If you have artificial nails or gel coating that need to be removed by a nail salon, please have this removed prior to surgery. Artificial nails or gel coating may interfere with anesthesia's ability to adequately monitor your vital signs.  [facility] is not responsible for any belongings or valuables. .   Do NOT Smoke (Tobacco/Vaping)  24  hours prior to your procedure  If you use a CPAP at night, you may bring your mask for your overnight stay.   Contacts, glasses, hearing aids, dentures or partials may not be worn into surgery, please bring cases for these belongings   For patients admitted to the hospital, discharge time will be determined by your treatment team.   Patients discharged the day of surgery will not be allowed to drive home, and someone needs to stay with them for 24 hours.   SURGICAL WAITING ROOM VISITATION Patients having surgery or a procedure may have no more than 2 support people in the waiting area - these visitors may rotate.   Children under the age of 33 must have an adult with them who is not the patient. If the patient needs to stay at the hospital during part of their recovery, the visitor guidelines for inpatient rooms apply. Pre-op nurse will coordinate an appropriate time for 1 support person to accompany patient in pre-op.  This support person may not rotate.   Please refer to the [facility] website for the visitor guidelines for Inpatients (after your surgery is over and you are in a regular room).    Special instructions:    Oral Hygiene is also important to reduce your risk of infection.  Remember - BRUSH YOUR TEETH THE MORNING OF SURGERY WITH YOUR REGULAR TOOTHPASTE   [facility]- Preparing For Surgery  Before surgery, you can play an important role. Because skin is not sterile, your skin needs to be as free of germs as possible. You can reduce the number of germs on your skin by washing with CHG (chlorahexidine gluconate) Soap before surgery.  CHG is an antiseptic cleaner which kills germs and bonds with the skin to continue killing germs even after washing.     Please do not use if you have an allergy to CHG or antibacterial soaps. If your skin becomes reddened/irritated stop using the CHG.  Do not shave (including legs and underarms) for at least 48 hours prior to first CHG shower.  It is OK to shave your face.  Please follow these instructions carefully.     Shower the NIGHT BEFORE SURGERY and the MORNING OF SURGERY with CHG Soap.   If you chose to wash your hair, wash your hair first as usual with your normal shampoo. After you shampoo, rinse your hair and body thoroughly to remove the shampoo.  Then [employer] and genitals (private parts) with your normal soap and rinse thoroughly to remove soap.  After that Use CHG Soap as you would any other liquid soap. You can apply CHG directly to the skin and wash gently with a scrungie or a clean washcloth.   Apply the CHG Soap to your body ONLY FROM THE NECK DOWN.  Do not use on open wounds or open sores. Avoid contact with your eyes, ears, mouth and genitals (private parts). Wash Face and genitals (private parts)  with your normal soap.   Wash thoroughly, paying special  attention to the area where your surgery will be performed.  Thoroughly rinse your body with warm water from the neck down.  DO NOT shower/wash with your normal soap after using and rinsing off the CHG Soap.  Pat yourself dry with a CLEAN TOWEL.  Wear CLEAN PAJAMAS to bed the night before surgery  Place CLEAN SHEETS on your bed the night before your surgery  DO NOT SLEEP WITH PETS.   Day of Surgery:  Take a shower with CHG soap. Wear Clean/Comfortable clothing the morning of surgery Do not apply any deodorants/lotions.   Remember to brush your teeth WITH YOUR REGULAR TOOTHPASTE.    If you received a COVID test during your pre-op visit, it is requested that you wear a mask when out in public, stay away from anyone that may not be feeling well, and notify your surgeon if you develop symptoms. If you have been in contact with anyone that has tested positive in the last 10 days, please notify your surgeon.    Please read over the following fact sheets that you were given.

## 2022-01-08 ENCOUNTER — Other Ambulatory Visit: Payer: Self-pay

## 2022-01-08 ENCOUNTER — Encounter (HOSPITAL_COMMUNITY): Payer: Self-pay

## 2022-01-08 ENCOUNTER — Encounter (HOSPITAL_COMMUNITY)
Admission: RE | Admit: 2022-01-08 | Discharge: 2022-01-08 | Disposition: A | Discharge status: Home / Self Care | Payer: Medicare Other | Source: Ambulatory Visit | Attending: Neurological Surgery | Admitting: Neurological Surgery

## 2022-01-08 VITALS — BP 127/81 | HR 83 | Temp 98.1°F | Resp 17 | Ht 68.0 in | Wt 240.8 lb

## 2022-01-08 DIAGNOSIS — Z01818 Encounter for other preprocedural examination: Secondary | ICD-10-CM

## 2022-01-08 DIAGNOSIS — Z01812 Encounter for preprocedural laboratory examination: Secondary | ICD-10-CM | POA: Diagnosis not present

## 2022-01-08 LAB — SURGICAL PCR SCREEN
MRSA, PCR: NEGATIVE
Staphylococcus aureus: NEGATIVE

## 2022-01-08 LAB — CBC
HCT: 42.9 % (ref 36.0–46.0)
Hemoglobin: 14.1 g/dL (ref 12.0–15.0)
MCH: 31.5 pg (ref 26.0–34.0)
MCHC: 32.9 g/dL (ref 30.0–36.0)
MCV: 96 fL (ref 80.0–100.0)
Platelets: 254 10*3/uL (ref 150–400)
RBC: 4.47 MIL/uL (ref 3.87–5.11)
RDW: 12.4 % (ref 11.5–15.5)
WBC: 8.9 10*3/uL (ref 4.0–10.5)
nRBC: 0 % (ref 0.0–0.2)

## 2022-01-08 LAB — BASIC METABOLIC PANEL
Anion gap: 13 (ref 5–15)
BUN: 18 mg/dL (ref 8–23)
CO2: 30 mmol/L (ref 22–32)
Calcium: 10.1 mg/dL (ref 8.9–10.3)
Chloride: 98 mmol/L (ref 98–111)
Creatinine, Ser: 0.66 mg/dL (ref 0.44–1.00)
GFR, Estimated: >60 mL/min (ref >60)
Glucose, Bld: 102 mg/dL — ABNORMAL HIGH (ref 70–99)
Potassium: 3.8 mmol/L (ref 3.5–5.1)
Sodium: 141 mmol/L (ref 135–145)

## 2022-01-08 LAB — GLUCOSE, CAPILLARY: Glucose-Capillary: 109 mg/dL — ABNORMAL HIGH (ref 70–99)

## 2022-01-08 LAB — TYPE AND SCREEN
ABO/RH(D): O POS
Antibody Screen: NEGATIVE

## 2022-01-08 LAB — HEMOGLOBIN A1C
Hgb A1c MFr Bld: 5.9 % — ABNORMAL HIGH (ref 4.8–5.6)
Mean Plasma Glucose: 122.63 mg/dL

## 2022-01-08 NOTE — Progress Notes (Signed)
Surgical Instructions    Your procedure is scheduled on Monday July 24.  Report to [facility] Main Entrance "A" at 10 A.M., then check in with the Admitting office.  Call this number if you have problems the morning of surgery:  [phone]   If you have any questions prior to your surgery date call [phone]: Open Monday-Friday 8am-4pm    Remember:  Do not eat after midnight the night before your surgery  You may drink clear liquids until 9:00am the morning of your surgery.   Clear liquids allowed are: Water, Non-Citrus Juices (without pulp), Carbonated Beverages, Clear Tea, Black Coffee ONLY (NO MILK, CREAM OR POWDERED CREAMER of any kind), and Gatorade    Take these medicines the morning of surgery with A SIP OF WATER:  levothyroxine (SYNTHROID)  sertraline (ZOLOFT)   If needed you may take: acetaminophen (TYLENOL)  gabapentin (NEURONTIN) promethazine (PHENERGAN)   As of today, STOP taking any Aspirin (unless otherwise instructed by your surgeon) Aleve, Naproxen, Ibuprofen, Motrin, Advil, Goody's, BC's, all herbal medications, fish oil, and all vitamins.  WHAT DO I DO ABOUT MY DIABETES MEDICATION?  Do not take oral diabetes medicines (pills) the morning of surgery. DO NOT take metFORMIN (GLUCOPHAGE) the morning of surgery.   HOW TO MANAGE YOUR DIABETES BEFORE AND AFTER SURGERY  Why is it important to control my blood sugar before and after surgery? Improving blood sugar levels before and after surgery helps healing and can limit problems. A way of improving blood sugar control is eating a healthy diet by:  Eating less sugar and carbohydrates  Increasing activity/exercise  Talking with your doctor about reaching your blood sugar goals High blood sugars (greater than 180 mg/dL) can raise your risk of infections and slow your recovery, so you will need to focus on controlling your diabetes during the weeks before surgery. Make sure that the doctor who takes care of your  diabetes knows about your planned surgery including the date and location.  How do I manage my blood sugar before surgery? Check your blood sugar at least 4 times a day, starting 2 days before surgery, to make sure that the level is not too high or low.  Check your blood sugar the morning of your surgery when you wake up and every 2 hours until you get to the Short Stay unit.  If your blood sugar is less than 70 mg/dL, you will need to treat for low blood sugar: Do not take insulin. Treat a low blood sugar (less than 70 mg/dL) with  cup of clear juice (cranberry or apple), 4 glucose tablets, OR glucose gel. Recheck blood sugar in 15 minutes after treatment (to make sure it is greater than 70 mg/dL). If your blood sugar is not greater than 70 mg/dL on recheck, call [phone] for further instructions. Report your blood sugar to the short stay nurse when you get to Short Stay.  If you are admitted to the hospital after surgery: Your blood sugar will be checked by the staff and you will probably be given insulin after surgery (instead of oral diabetes medicines) to make sure you have good blood sugar levels. The goal for blood sugar control after surgery is 80-180 mg/dL.       Do not wear jewelry or makeup Do not wear lotions, powders, perfumes/colognes, or deodorant. Do not shave 48 hours prior to surgery.  Men may shave face and neck. Do not bring valuables to the hospital. Do not wear nail polish, gel  polish, artificial nails, or any other type of covering on natural nails (fingers and toes) If you have artificial nails or gel coating that need to be removed by a nail salon, please have this removed prior to surgery. Artificial nails or gel coating may interfere with anesthesia's ability to adequately monitor your vital signs.  [place] is not responsible for any belongings or valuables. .   Do NOT Smoke (Tobacco/Vaping)  24 hours prior to your procedure  If you use a CPAP at night,  you may bring your mask for your overnight stay.   Contacts, glasses, hearing aids, dentures or partials may not be worn into surgery, please bring cases for these belongings   For patients admitted to the hospital, discharge time will be determined by your treatment team.   Patients discharged the day of surgery will not be allowed to drive home, and someone needs to stay with them for 24 hours.   SURGICAL WAITING ROOM VISITATION Patients having surgery or a procedure may have no more than 2 support people in the waiting area - these visitors may rotate.   Children under the age of 59 must have an adult with them who is not the patient. If the patient needs to stay at the hospital during part of their recovery, the visitor guidelines for inpatient rooms apply. Pre-op nurse will coordinate an appropriate time for 1 support person to accompany patient in pre-op.  This support person may not rotate.   Please refer to the [facility] website for the visitor guidelines for Inpatients (after your surgery is over and you are in a regular room).    Special instructions:    Oral Hygiene is also important to reduce your risk of infection.  Remember - BRUSH YOUR TEETH THE MORNING OF SURGERY WITH YOUR REGULAR TOOTHPASTE   - Preparing For Surgery  Before surgery, you can play an important role. Because skin is not sterile, your skin needs to be as free of germs as possible. You can reduce the number of germs on your skin by washing with CHG (chlorahexidine gluconate) Soap before surgery.  CHG is an antiseptic cleaner which kills germs and bonds with the skin to continue killing germs even after washing.     Please do not use if you have an allergy to CHG or antibacterial soaps. If your skin becomes reddened/irritated stop using the CHG.  Do not shave (including legs and underarms) for at least 48 hours prior to first CHG shower. It is OK to shave your face.  Please follow these  instructions carefully.     Shower the NIGHT BEFORE SURGERY and the MORNING OF SURGERY with CHG Soap.   If you chose to wash your hair, wash your hair first as usual with your normal shampoo. After you shampoo, rinse your hair and body thoroughly to remove the shampoo.  Then [employer] and genitals (private parts) with your normal soap and rinse thoroughly to remove soap.  After that Use CHG Soap as you would any other liquid soap. You can apply CHG directly to the skin and wash gently with a scrungie or a clean washcloth.   Apply the CHG Soap to your body ONLY FROM THE NECK DOWN.  Do not use on open wounds or open sores. Avoid contact with your eyes, ears, mouth and genitals (private parts). Wash Face and genitals (private parts)  with your normal soap.   Wash thoroughly, paying special attention to the area where your surgery  will be performed.  Thoroughly rinse your body with warm water from the neck down.  DO NOT shower/wash with your normal soap after using and rinsing off the CHG Soap.  Pat yourself dry with a CLEAN TOWEL.  Wear CLEAN PAJAMAS to bed the night before surgery  Place CLEAN SHEETS on your bed the night before your surgery  DO NOT SLEEP WITH PETS.   Day of Surgery:  Take a shower with CHG soap. Wear Clean/Comfortable clothing the morning of surgery Do not apply any deodorants/lotions.   Remember to brush your teeth WITH YOUR REGULAR TOOTHPASTE.    If you received a COVID test during your pre-op visit, it is requested that you wear a mask when out in public, stay away from anyone that may not be feeling well, and notify your surgeon if you develop symptoms. If you have been in contact with anyone that has tested positive in the last 10 days, please notify your surgeon.    Please read over the following fact sheets that you were given.

## 2022-01-08 NOTE — Progress Notes (Signed)
PCP -Asencion Noble MD Cardiologist - deneis  PPM/ICD - denies Device Orders -  Rep Notified -   Chest x-ray - 05/05/21 EKG - 02/08/21 Stress Test - denies ECHO - denies Cardiac Cath - denies  Sleep Study - denies CPAP -   Fasting Blood Sugar - 101 Checks Blood Sugar weekly  Blood Thinner Instructions:na Aspirin Instructions:na  ERAS Protcol -clear liquids until 0900 PRE-SURGERY Ensure or G2- no  COVID TEST- na   Anesthesia review: no  Patient denies shortness of breath, fever, cough and chest pain at PAT appointment   All instructions explained to the patient, with a verbal understanding of the material. Patient agrees to go over the instructions while at home for a better understanding. Patient also instructed to wear a mask when out in public prior to surgery. The opportunity to ask questions was provided.

## 2022-01-14 ENCOUNTER — Inpatient Hospital Stay (HOSPITAL_COMMUNITY): Payer: Medicare Other | Admitting: Anesthesiology

## 2022-01-14 ENCOUNTER — Inpatient Hospital Stay (HOSPITAL_COMMUNITY): Payer: Medicare Other

## 2022-01-14 ENCOUNTER — Encounter (HOSPITAL_COMMUNITY)
Admission: RE | Disposition: A | Discharge status: Home / Self Care | Payer: Self-pay | Source: Home / Self Care | Attending: Neurological Surgery

## 2022-01-14 ENCOUNTER — Inpatient Hospital Stay (HOSPITAL_COMMUNITY)
Admission: RE | Admit: 2022-01-14 | Discharge: 2022-01-15 | DRG: 030 | Disposition: A | Discharge status: Home / Self Care | Payer: Medicare Other | Attending: Neurological Surgery | Admitting: Neurological Surgery

## 2022-01-14 ENCOUNTER — Other Ambulatory Visit: Payer: Self-pay

## 2022-01-14 ENCOUNTER — Encounter (HOSPITAL_COMMUNITY): Payer: Self-pay | Admitting: Neurological Surgery

## 2022-01-14 DIAGNOSIS — I10 Essential (primary) hypertension: Secondary | ICD-10-CM | POA: Diagnosis not present

## 2022-01-14 DIAGNOSIS — Z7984 Long term (current) use of oral hypoglycemic drugs: Secondary | ICD-10-CM | POA: Diagnosis not present

## 2022-01-14 DIAGNOSIS — G959 Disease of spinal cord, unspecified: Secondary | ICD-10-CM | POA: Diagnosis not present

## 2022-01-14 DIAGNOSIS — M4322 Fusion of spine, cervical region: Secondary | ICD-10-CM | POA: Diagnosis not present

## 2022-01-14 DIAGNOSIS — F419 Anxiety disorder, unspecified: Secondary | ICD-10-CM | POA: Diagnosis present

## 2022-01-14 DIAGNOSIS — E119 Type 2 diabetes mellitus without complications: Secondary | ICD-10-CM | POA: Diagnosis not present

## 2022-01-14 DIAGNOSIS — Z833 Family history of diabetes mellitus: Secondary | ICD-10-CM

## 2022-01-14 DIAGNOSIS — E039 Hypothyroidism, unspecified: Secondary | ICD-10-CM

## 2022-01-14 DIAGNOSIS — E785 Hyperlipidemia, unspecified: Secondary | ICD-10-CM | POA: Diagnosis not present

## 2022-01-14 DIAGNOSIS — Z8249 Family history of ischemic heart disease and other diseases of the circulatory system: Secondary | ICD-10-CM

## 2022-01-14 DIAGNOSIS — M4802 Spinal stenosis, cervical region: Secondary | ICD-10-CM | POA: Diagnosis not present

## 2022-01-14 HISTORY — PX: ANTERIOR CERVICAL DECOMP/DISCECTOMY FUSION: SHX1161

## 2022-01-14 LAB — GLUCOSE, CAPILLARY
Glucose-Capillary: 116 mg/dL — ABNORMAL HIGH (ref 70–99)
Glucose-Capillary: 108 mg/dL — ABNORMAL HIGH (ref 70–99)
Glucose-Capillary: 137 mg/dL — ABNORMAL HIGH (ref 70–99)
Glucose-Capillary: 146 mg/dL — ABNORMAL HIGH (ref 70–99)
Glucose-Capillary: 218 mg/dL — ABNORMAL HIGH (ref 70–99)

## 2022-01-14 SURGERY — ANTERIOR CERVICAL DECOMPRESSION/DISCECTOMY FUSION 1 LEVEL/HARDWARE REMOVAL
Anesthesia: General

## 2022-01-14 MED ORDER — LINACLOTIDE 145 MCG PO CAPS
145.0000 ug | ORAL_CAPSULE | Freq: Every day | ORAL | Status: DC | PRN
Start: 2022-01-14 — End: 2022-01-15

## 2022-01-14 MED ORDER — LABETALOL HCL 5 MG/ML IV SOLN
INTRAVENOUS | Status: AC
  Filled 2022-01-14: qty 4

## 2022-01-14 MED ORDER — MENTHOL 3 MG MT LOZG: 1.0000 | LOZENGE | OROMUCOSAL | Status: DC | PRN

## 2022-01-14 MED ORDER — OXYCODONE HCL 5 MG PO TABS
ORAL_TABLET | ORAL | Status: AC
  Administered 2022-01-14: 5 mg via ORAL
  Filled 2022-01-14: qty 1

## 2022-01-14 MED ORDER — PHENYLEPHRINE HCL-NACL 20-0.9 MG/250ML-% IV SOLN
INTRAVENOUS | Status: DC | PRN
  Administered 2022-01-14: 25 ug/min via INTRAVENOUS

## 2022-01-14 MED ORDER — FENTANYL CITRATE (PF) 250 MCG/5ML IJ SOLN
INTRAMUSCULAR | Status: AC
  Filled 2022-01-14: qty 5

## 2022-01-14 MED ORDER — ABATACEPT 125 MG/ML ~~LOC~~ SOAJ: 125.0000 ug | SUBCUTANEOUS | Status: DC

## 2022-01-14 MED ORDER — CHLORHEXIDINE GLUCONATE 0.12 % MT SOLN
15.0000 mL | Freq: Once | OROMUCOSAL | Status: AC
  Administered 2022-01-14: 15 mL via OROMUCOSAL
  Filled 2022-01-14: qty 15

## 2022-01-14 MED ORDER — ACETAMINOPHEN 325 MG PO TABS
650.0000 mg | ORAL_TABLET | ORAL | Status: DC | PRN
  Administered 2022-01-15 (×2): 650 mg via ORAL
  Filled 2022-01-14 (×2): qty 2

## 2022-01-14 MED ORDER — ATORVASTATIN CALCIUM 40 MG PO TABS
40.0000 mg | ORAL_TABLET | Freq: Every day | ORAL | Status: DC
  Administered 2022-01-14: 40 mg via ORAL
  Filled 2022-01-14: qty 1

## 2022-01-14 MED ORDER — POLYETHYLENE GLYCOL 3350 17 G PO PACK: 17.0000 g | PACK | Freq: Every day | ORAL | Status: DC | PRN

## 2022-01-14 MED ORDER — LACTATED RINGERS IV SOLN: INTRAVENOUS | Status: DC

## 2022-01-14 MED ORDER — FUROSEMIDE 40 MG PO TABS
80.0000 mg | ORAL_TABLET | Freq: Every day | ORAL | Status: DC
  Administered 2022-01-14 – 2022-01-15 (×2): 80 mg via ORAL
  Filled 2022-01-14 (×2): qty 2

## 2022-01-14 MED ORDER — ONDANSETRON HCL 4 MG/2ML IJ SOLN
INTRAMUSCULAR | Status: AC
Start: 2022-01-14 — End: ?
  Filled 2022-01-14: qty 2

## 2022-01-14 MED ORDER — ROCURONIUM BROMIDE 10 MG/ML (PF) SYRINGE
PREFILLED_SYRINGE | INTRAVENOUS | Status: DC | PRN
  Administered 2022-01-14: 20 mg via INTRAVENOUS
  Administered 2022-01-14: 10 mg via INTRAVENOUS
  Administered 2022-01-14: 20 mg via INTRAVENOUS
  Administered 2022-01-14: 70 mg via INTRAVENOUS

## 2022-01-14 MED ORDER — SCOPOLAMINE 1 MG/3DAYS TD PT72
1.0000 | MEDICATED_PATCH | TRANSDERMAL | Status: DC
  Administered 2022-01-14: 1.5 mg via TRANSDERMAL
  Filled 2022-01-14: qty 1

## 2022-01-14 MED ORDER — CEFAZOLIN SODIUM-DEXTROSE 2-4 GM/100ML-% IV SOLN
2.0000 g | INTRAVENOUS | Status: AC
  Administered 2022-01-14: 2 g via INTRAVENOUS
  Filled 2022-01-14: qty 100

## 2022-01-14 MED ORDER — SUGAMMADEX SODIUM 200 MG/2ML IV SOLN
INTRAVENOUS | Status: DC | PRN
  Administered 2022-01-14: 200 mg via INTRAVENOUS
  Administered 2022-01-14: 25 mg via INTRAVENOUS

## 2022-01-14 MED ORDER — PROMETHAZINE HCL 25 MG PO TABS
12.5000 mg | ORAL_TABLET | Freq: Four times a day (QID) | ORAL | Status: DC | PRN
Start: 2022-01-14 — End: 2022-01-15
  Administered 2022-01-14 – 2022-01-15 (×2): 12.5 mg via ORAL
  Filled 2022-01-14 (×2): qty 1

## 2022-01-14 MED ORDER — OXYCODONE HCL 5 MG PO TABS
10.0000 mg | ORAL_TABLET | ORAL | Status: DC | PRN
  Administered 2022-01-14: 10 mg via ORAL
  Filled 2022-01-14 (×2): qty 2

## 2022-01-14 MED ORDER — ACETAMINOPHEN 650 MG RE SUPP: 650.0000 mg | RECTAL | Status: DC | PRN

## 2022-01-14 MED ORDER — OXYCODONE-ACETAMINOPHEN 5-325 MG PO TABS: 1.0000 | ORAL_TABLET | ORAL | 0 refills | Status: AC | PRN

## 2022-01-14 MED ORDER — ROCURONIUM BROMIDE 10 MG/ML (PF) SYRINGE
PREFILLED_SYRINGE | INTRAVENOUS | Status: AC
  Filled 2022-01-14: qty 10

## 2022-01-14 MED ORDER — PROMETHAZINE HCL 25 MG/ML IJ SOLN: 6.2500 mg | INTRAMUSCULAR | Status: DC | PRN

## 2022-01-14 MED ORDER — ONDANSETRON HCL 4 MG/2ML IJ SOLN
INTRAMUSCULAR | Status: DC | PRN
  Administered 2022-01-14: 4 mg via INTRAVENOUS

## 2022-01-14 MED ORDER — HYDROMORPHONE HCL 1 MG/ML IJ SOLN
0.2500 mg | INTRAMUSCULAR | Status: DC | PRN
  Administered 2022-01-14: 0.5 mg via INTRAVENOUS

## 2022-01-14 MED ORDER — THROMBIN 5000 UNITS EX SOLR
OROMUCOSAL | Status: DC | PRN
  Administered 2022-01-14: 5 mL via TOPICAL

## 2022-01-14 MED ORDER — SERTRALINE HCL 50 MG PO TABS
100.0000 mg | ORAL_TABLET | Freq: Every day | ORAL | Status: DC
  Administered 2022-01-15: 100 mg via ORAL
  Filled 2022-01-14: qty 2

## 2022-01-14 MED ORDER — DOCUSATE SODIUM 100 MG PO CAPS
200.0000 mg | ORAL_CAPSULE | Freq: Two times a day (BID) | ORAL | Status: DC
  Administered 2022-01-14 – 2022-01-15 (×2): 200 mg via ORAL
  Filled 2022-01-14 (×2): qty 2

## 2022-01-14 MED ORDER — ONDANSETRON HCL 4 MG PO TABS: 4.0000 mg | ORAL_TABLET | Freq: Four times a day (QID) | ORAL | Status: DC | PRN

## 2022-01-14 MED ORDER — LABETALOL HCL 5 MG/ML IV SOLN
INTRAVENOUS | Status: DC | PRN
  Administered 2022-01-14: 5 mg via INTRAVENOUS

## 2022-01-14 MED ORDER — HYDROMORPHONE HCL 1 MG/ML IJ SOLN: 1.0000 mg | INTRAMUSCULAR | Status: DC | PRN

## 2022-01-14 MED ORDER — ORAL CARE MOUTH RINSE: 15.0000 mL | Freq: Once | OROMUCOSAL | Status: AC

## 2022-01-14 MED ORDER — PHENYLEPHRINE 80 MCG/ML (10ML) SYRINGE FOR IV PUSH (FOR BLOOD PRESSURE SUPPORT)
PREFILLED_SYRINGE | INTRAVENOUS | Status: DC | PRN
  Administered 2022-01-14: 40 ug via INTRAVENOUS
  Administered 2022-01-14: 80 ug via INTRAVENOUS
  Administered 2022-01-14: 40 ug via INTRAVENOUS
  Administered 2022-01-14: 80 ug via INTRAVENOUS

## 2022-01-14 MED ORDER — MEPERIDINE HCL 25 MG/ML IJ SOLN: 6.2500 mg | INTRAMUSCULAR | Status: DC | PRN

## 2022-01-14 MED ORDER — CHLORHEXIDINE GLUCONATE CLOTH 2 % EX PADS: 6.0000 | MEDICATED_PAD | Freq: Once | CUTANEOUS | Status: DC

## 2022-01-14 MED ORDER — MUSCLE RUB 10-15 % EX CREA
1.0000 | TOPICAL_CREAM | Freq: Every day | CUTANEOUS | Status: DC | PRN
  Filled 2022-01-14: qty 85

## 2022-01-14 MED ORDER — GABAPENTIN 300 MG PO CAPS
300.0000 mg | ORAL_CAPSULE | Freq: Every day | ORAL | Status: DC | PRN
  Administered 2022-01-15: 300 mg via ORAL
  Filled 2022-01-14: qty 1

## 2022-01-14 MED ORDER — 0.9 % SODIUM CHLORIDE (POUR BTL) OPTIME
TOPICAL | Status: DC | PRN
  Administered 2022-01-14: 1000 mL

## 2022-01-14 MED ORDER — THROMBIN 5000 UNITS EX SOLR
CUTANEOUS | Status: AC
  Filled 2022-01-14: qty 5000

## 2022-01-14 MED ORDER — SPIRONOLACTONE 25 MG PO TABS
25.0000 mg | ORAL_TABLET | ORAL | Status: DC
  Administered 2022-01-14: 25 mg via ORAL
  Filled 2022-01-14: qty 1

## 2022-01-14 MED ORDER — CYCLOBENZAPRINE HCL 10 MG PO TABS: 10.0000 mg | ORAL_TABLET | Freq: Three times a day (TID) | ORAL | Status: DC | PRN

## 2022-01-14 MED ORDER — CEFAZOLIN SODIUM-DEXTROSE 2-4 GM/100ML-% IV SOLN
2.0000 g | Freq: Three times a day (TID) | INTRAVENOUS | Status: AC
  Administered 2022-01-14 – 2022-01-15 (×2): 2 g via INTRAVENOUS
  Filled 2022-01-14 (×2): qty 100

## 2022-01-14 MED ORDER — ONDANSETRON HCL 4 MG/2ML IJ SOLN
4.0000 mg | Freq: Four times a day (QID) | INTRAMUSCULAR | Status: DC | PRN
Start: 2022-01-14 — End: 2022-01-15

## 2022-01-14 MED ORDER — FENTANYL CITRATE (PF) 250 MCG/5ML IJ SOLN
INTRAMUSCULAR | Status: DC | PRN
  Administered 2022-01-14: 150 ug via INTRAVENOUS
  Administered 2022-01-14 (×4): 50 ug via INTRAVENOUS

## 2022-01-14 MED ORDER — NEPAFENAC 0.3 % OP SUSP: 1.0000 [drp] | Freq: Every day | OPHTHALMIC | Status: DC

## 2022-01-14 MED ORDER — DEXAMETHASONE SODIUM PHOSPHATE 10 MG/ML IJ SOLN
INTRAMUSCULAR | Status: AC
  Filled 2022-01-14: qty 1

## 2022-01-14 MED ORDER — OXYCODONE HCL 5 MG PO TABS
5.0000 mg | ORAL_TABLET | ORAL | Status: DC | PRN
  Administered 2022-01-15 (×2): 5 mg via ORAL
  Filled 2022-01-14 (×2): qty 1

## 2022-01-14 MED ORDER — LIDOCAINE-EPINEPHRINE 1 %-1:100000 IJ SOLN
INTRAMUSCULAR | Status: AC
  Filled 2022-01-14: qty 1

## 2022-01-14 MED ORDER — MIDAZOLAM HCL 5 MG/5ML IJ SOLN
INTRAMUSCULAR | Status: DC | PRN
  Administered 2022-01-14: 2 mg via INTRAVENOUS

## 2022-01-14 MED ORDER — DEXAMETHASONE SODIUM PHOSPHATE 10 MG/ML IJ SOLN
INTRAMUSCULAR | Status: DC | PRN
  Administered 2022-01-14: 4 mg via INTRAVENOUS

## 2022-01-14 MED ORDER — OXYCODONE HCL 5 MG/5ML PO SOLN: 5.0000 mg | Freq: Once | ORAL | Status: AC | PRN

## 2022-01-14 MED ORDER — PHENOL 1.4 % MT LIQD: 1.0000 | OROMUCOSAL | Status: DC | PRN

## 2022-01-14 MED ORDER — OXYCODONE HCL 5 MG PO TABS: 5.0000 mg | ORAL_TABLET | Freq: Once | ORAL | Status: AC | PRN

## 2022-01-14 MED ORDER — HYDROMORPHONE HCL 1 MG/ML IJ SOLN
INTRAMUSCULAR | Status: AC
  Administered 2022-01-14: 0.5 mg via INTRAVENOUS
  Filled 2022-01-14: qty 1

## 2022-01-14 MED ORDER — ACETAMINOPHEN 500 MG PO TABS
1000.0000 mg | ORAL_TABLET | Freq: Once | ORAL | Status: AC
Start: 2022-01-14 — End: 2022-01-14
  Administered 2022-01-14: 1000 mg via ORAL
  Filled 2022-01-14: qty 2

## 2022-01-14 MED ORDER — SODIUM CHLORIDE 0.9 % IV SOLN
250.0000 mL | INTRAVENOUS | Status: DC
Start: 2022-01-14 — End: 2022-01-15

## 2022-01-14 MED ORDER — PROPOFOL 10 MG/ML IV BOLUS
INTRAVENOUS | Status: AC
  Filled 2022-01-14: qty 20

## 2022-01-14 MED ORDER — PREDNISOLONE ACETATE 1 % OP SUSP
1.0000 [drp] | Freq: Two times a day (BID) | OPHTHALMIC | Status: DC
  Filled 2022-01-14: qty 5

## 2022-01-14 MED ORDER — HYDROXYCHLOROQUINE SULFATE 200 MG PO TABS: 200.0000 mg | ORAL_TABLET | Freq: Two times a day (BID) | ORAL | Status: DC

## 2022-01-14 MED ORDER — LIDOCAINE-EPINEPHRINE 1 %-1:100000 IJ SOLN
INTRAMUSCULAR | Status: DC | PRN
  Administered 2022-01-14: 10 mL

## 2022-01-14 MED ORDER — MIDAZOLAM HCL 2 MG/2ML IJ SOLN: 0.5000 mg | Freq: Once | INTRAMUSCULAR | Status: DC | PRN

## 2022-01-14 MED ORDER — SODIUM CHLORIDE 0.9% FLUSH
3.0000 mL | Freq: Two times a day (BID) | INTRAVENOUS | Status: DC
  Administered 2022-01-14: 3 mL via INTRAVENOUS

## 2022-01-14 MED ORDER — MIDAZOLAM HCL 2 MG/2ML IJ SOLN
INTRAMUSCULAR | Status: AC
  Filled 2022-01-14: qty 2

## 2022-01-14 MED ORDER — PROPOFOL 10 MG/ML IV BOLUS
INTRAVENOUS | Status: DC | PRN
  Administered 2022-01-14: 150 mg via INTRAVENOUS

## 2022-01-14 MED ORDER — LIDOCAINE 2% (20 MG/ML) 5 ML SYRINGE
INTRAMUSCULAR | Status: DC | PRN
  Administered 2022-01-14: 40 mg via INTRAVENOUS

## 2022-01-14 MED ORDER — LIDOCAINE 2% (20 MG/ML) 5 ML SYRINGE
INTRAMUSCULAR | Status: AC
  Filled 2022-01-14: qty 5

## 2022-01-14 MED ORDER — PHENYLEPHRINE 80 MCG/ML (10ML) SYRINGE FOR IV PUSH (FOR BLOOD PRESSURE SUPPORT)
PREFILLED_SYRINGE | INTRAVENOUS | Status: AC
Start: 2022-01-14 — End: ?
  Filled 2022-01-14: qty 10

## 2022-01-14 MED ORDER — SODIUM CHLORIDE 0.9% FLUSH: 3.0000 mL | INTRAVENOUS | Status: DC | PRN

## 2022-01-14 MED ORDER — LEVOTHYROXINE SODIUM 88 MCG PO TABS
88.0000 ug | ORAL_TABLET | Freq: Every day | ORAL | Status: DC
  Administered 2022-01-15: 88 ug via ORAL
  Filled 2022-01-14: qty 1

## 2022-01-14 SURGICAL SUPPLY — 59 items
ADH SKN CLS APL DERMABOND .7 (GAUZE/BANDAGES/DRESSINGS) ×1
ADH SKN CLS LQ APL DERMABOND (GAUZE/BANDAGES/DRESSINGS) ×1
APL SKNCLS STERI-STRIP NONHPOA (GAUZE/BANDAGES/DRESSINGS)
BAG COUNTER SPONGE SURGICOUNT (BAG) ×3 IMPLANT
BAG SPNG CNTER NS LX DISP (BAG) ×2
BAND INSRT 18 STRL LF DISP RB (MISCELLANEOUS) ×2
BAND RUBBER #18 3X1/16 STRL (MISCELLANEOUS) ×4 IMPLANT
BENZOIN TINCTURE PRP APPL 2/3 (GAUZE/BANDAGES/DRESSINGS) IMPLANT
BLADE CLIPPER SURG (BLADE) IMPLANT
BLADE SURG 11 STRL SS (BLADE) ×2 IMPLANT
BUR MATCHSTICK NEURO 3.0 LAGG (BURR) ×2 IMPLANT
CANISTER SUCT 3000ML PPV (MISCELLANEOUS) ×2 IMPLANT
DERMABOND ADHESIVE PROPEN (GAUZE/BANDAGES/DRESSINGS) ×1
DERMABOND ADVANCED (GAUZE/BANDAGES/DRESSINGS) ×1
DERMABOND ADVANCED .7 DNX12 (GAUZE/BANDAGES/DRESSINGS) ×1 IMPLANT
DERMABOND ADVANCED .7 DNX6 (GAUZE/BANDAGES/DRESSINGS) IMPLANT
DRAPE C-ARM 42X72 X-RAY (DRAPES) ×4 IMPLANT
DRAPE HALF SHEET 40X57 (DRAPES) ×1 IMPLANT
DRAPE LAPAROTOMY 100X72 PEDS (DRAPES) ×2 IMPLANT
DRAPE MICROSCOPE LEICA (MISCELLANEOUS) ×2 IMPLANT
DURAPREP 6ML APPLICATOR 50/CS (WOUND CARE) ×2 IMPLANT
ELECT COATED BLADE 2.86 ST (ELECTRODE) ×2 IMPLANT
ELECT REM PT RETURN 9FT ADLT (ELECTROSURGICAL) ×2
ELECTRODE REM PT RTRN 9FT ADLT (ELECTROSURGICAL) ×1 IMPLANT
GAUZE 4X4 16PLY ~~LOC~~+RFID DBL (SPONGE) IMPLANT
GLOVE BIOGEL PI IND STRL 7.5 (GLOVE) ×2 IMPLANT
GLOVE BIOGEL PI INDICATOR 7.5 (GLOVE) ×2
GLOVE ECLIPSE 7.5 STRL STRAW (GLOVE) ×2 IMPLANT
GLOVE EXAM NITRILE LRG STRL (GLOVE) IMPLANT
GLOVE EXAM NITRILE XL STR (GLOVE) IMPLANT
GLOVE EXAM NITRILE XS STR PU (GLOVE) IMPLANT
GOWN STRL REUS W/ TWL LRG LVL3 (GOWN DISPOSABLE) ×2 IMPLANT
GOWN STRL REUS W/ TWL XL LVL3 (GOWN DISPOSABLE) IMPLANT
GOWN STRL REUS W/TWL 2XL LVL3 (GOWN DISPOSABLE) IMPLANT
GOWN STRL REUS W/TWL LRG LVL3 (GOWN DISPOSABLE) ×4
GOWN STRL REUS W/TWL XL LVL3 (GOWN DISPOSABLE)
HEMOSTAT POWDER KIT SURGIFOAM (HEMOSTASIS) ×2 IMPLANT
KIT BASIN OR (CUSTOM PROCEDURE TRAY) ×2 IMPLANT
KIT TURNOVER KIT B (KITS) ×2 IMPLANT
NDL SPNL 18GX3.5 QUINCKE PK (NEEDLE) ×1 IMPLANT
NEEDLE HYPO 22GX1.5 SAFETY (NEEDLE) ×2 IMPLANT
NEEDLE SPNL 18GX3.5 QUINCKE PK (NEEDLE) ×2 IMPLANT
NS IRRIG 1000ML POUR BTL (IV SOLUTION) ×2 IMPLANT
PACK LAMINECTOMY NEURO (CUSTOM PROCEDURE TRAY) ×2 IMPLANT
PAD ARMBOARD 7.5X6 YLW CONV (MISCELLANEOUS) ×6 IMPLANT
PIN DISTRACTION 14MM (PIN) IMPLANT
PLATE ELITE 21MM (Plate) ×1 IMPLANT
SCREW SELF TAP VAR 4.0X13 (Screw) ×4 IMPLANT
SPACER BONE CORNERSTONE 7X14 (Orthopedic Implant) ×1 IMPLANT
SPIKE FLUID TRANSFER (MISCELLANEOUS) ×1 IMPLANT
SPONGE INTESTINAL PEANUT (DISPOSABLE) ×2 IMPLANT
SPONGE SURGIFOAM ABS GEL SZ50 (HEMOSTASIS) IMPLANT
STAPLER VISISTAT 35W (STAPLE) IMPLANT
SUT MNCRL AB 3-0 PS2 18 (SUTURE) ×2 IMPLANT
SUT VIC AB 3-0 SH 8-18 (SUTURE) ×3 IMPLANT
TAPE CLOTH 3X10 TAN LF (GAUZE/BANDAGES/DRESSINGS) ×1 IMPLANT
TOWEL GREEN STERILE (TOWEL DISPOSABLE) ×2 IMPLANT
TOWEL GREEN STERILE FF (TOWEL DISPOSABLE) ×2 IMPLANT
WATER STERILE IRR 1000ML POUR (IV SOLUTION) ×2 IMPLANT

## 2022-01-14 NOTE — Op Note (Signed)
PATIENT: Monique Weiss  PROCEDURE DATE: 01/14/22  PRE-OPERATIVE DIAGNOSIS:  Cervical myelopathy   POST-OPERATIVE DIAGNOSIS:  Same   PROCEDURE:  C3-C4 Anterior Cervical Discectomy and Instrumented Fusion, removal of anterior cervical plate spanning Y3-K1   SURGEON:  Surgeon(s) and Role:    Judith Part, MD - Primary   ANESTHESIA: ETGA   BRIEF HISTORY: This is a 67 year old woman in whom I previously performed a corpectomy and ACDF in 2020 for myelopathy. She did very well post-op with return of function, then re-presented more recently with recurrence of her myelopathy symptoms. Workup showed adjacent segment disease at C3-4 with stenosis and early cord signal change. CT showed good arthrodesis at the prior levels in case the plate needed to be removed, which it appeared would be necessary. I therefore recommended ACDF at C3-4 with possible removal of prior plate. This was discussed with the patient as well as risks, benefits, and alternatives and the patient wished to proceed with surgical treatment.   OPERATIVE DETAIL: The patient was taken to the operating room and placed on the OR table in the supine position. A formal time out was performed with two patient identifiers and confirmed the operative site. Anesthesia was induced by the anesthesia team.  Fluoroscopy was used to localize the surgical level and an incision was marked in a skin crease. The area was then prepped and draped in a sterile fashion. A transverse linear incision was made on the right side of the neck, using the patient's prior incision. The platysma was divided and the sternocleidomastoid muscle was identified. The carotid sheath was palpated, identified, and retracted laterally with the sternocleidomastoid muscle. The strap muscles were identified and retracted medially and the pretracheal fascia was entered. The existing cervical plate was used to localize the C3-4 segment at the top of the plate. The longus colli  were elevated bilaterally and a self-retaining retractor was placed. The endotracheal tube cuff balloon was deflated and reinflated after retractor placement.   The anterior cervical plate was indeed blocking access to the C3-4 disc space. I therefore dissected the plate caudally and removed it. Attention was then returned to C3-4.  Anterior osteophytes were removed until flush with the anterior vertebral body. The disc annulus was incised and a complete C3-C4 discectomy was performed. The posterior longitudinal ligament was incised followed by ligamentous and bony removal until no central canal stenosis was present. Decompression was then taken out laterally into the bilateral foramina until no foraminal stenosis was palpable. A 33m cortical allograft ([employer]) was inserted into the disc space as an interbody graft. An anterior plate ([employer]tronic) was positioned and 4, 139mscrews were used to secure the plate to the C3 and C4 vertebral bodies. Hemostasis was obtained and the incision was closed in layers. All instrument and sponge counts were correct. The patient was then returned to anesthesia for emergence. No apparent complications at the completion of the procedure.   EBL:  7563m DRAINS: none   SPECIMENS: none   ThoJudith PartD 01/14/22 1:23 PM

## 2022-01-14 NOTE — Anesthesia Postprocedure Evaluation (Signed)
Anesthesia Post Note  Patient: Monique Weiss  Procedure(s) Performed: Cervical Three-Four  Anterior Cervical Discectomy Fusion with Removal of anterior cervical plate     Patient location during evaluation: PACU Anesthesia Type: General Level of consciousness: awake and alert, patient cooperative and oriented Pain management: pain level controlled Vital Signs Assessment: post-procedure vital signs reviewed and stable Respiratory status: spontaneous breathing, nonlabored ventilation and respiratory function stable Cardiovascular status: blood pressure returned to baseline and stable Postop Assessment: no apparent nausea or vomiting Anesthetic complications: no   No notable events documented.  Last Vitals:  Vitals:   01/14/22 1645 01/14/22 1700  BP: 127/73 135/68  Pulse: 81 82  Resp: 13 13  Temp:    SpO2: 90% 91%    Last Pain:  Vitals:   01/14/22 1700  TempSrc:   PainSc: 3                  Evadene Wardrip,E. Starnisha Batrez

## 2022-01-14 NOTE — Transfer of Care (Signed)
Immediate Anesthesia Transfer of Care Note  Patient: Monique Weiss  Procedure(s) Performed: Cervical Three-Four  Anterior Cervical Discectomy Fusion with Removal of anterior cervical plate  Patient Location: PACU  Anesthesia Type:General  Level of Consciousness: oriented, drowsy and patient cooperative  Airway & Oxygen Therapy: Patient Spontanous Breathing and Patient connected to nasal cannula oxygen  Post-op Assessment: Report given to RN and Post -op Vital signs reviewed and stable  Post vital signs: Reviewed  Last Vitals:  Vitals Value Taken Time  BP 153/84 01/14/22 1612  Temp    Pulse 82 01/14/22 1615  Resp 13 01/14/22 1615  SpO2 89 % 01/14/22 1615  Vitals shown include unvalidated device data.  Last Pain:  Vitals:   01/14/22 1038  TempSrc:   PainSc: 3       Patients Stated Pain Goal: 0 (51/10/21 1173)  Complications: No notable events documented.

## 2022-01-14 NOTE — Anesthesia Preprocedure Evaluation (Addendum)
Anesthesia Evaluation  Patient identified by MRN, date of birth, ID band Patient awake    Reviewed: Allergy & Precautions, NPO status , Patient's Chart, lab work & pertinent test results  History of Anesthesia Complications (+) PONV and history of anesthetic complications  Airway Mallampati: II  TM Distance: >3 FB Neck ROM: Full    Dental  (+) Dental Advisory Given   Pulmonary neg pulmonary ROS,    breath sounds clear to auscultation       Cardiovascular hypertension, Pt. on medications (-) angina Rhythm:Regular Rate:Normal     Neuro/Psych Anxiety    GI/Hepatic negative GI ROS, Neg liver ROS,   Endo/Other  diabetes (glu 116), Type 2, Oral Hypoglycemic AgentsHypothyroidism Morbid obesity  Renal/GU negative Renal ROS     Musculoskeletal  (+) Arthritis , Rheumatoid disorders,    Abdominal (+) + obese,   Peds  Hematology   Anesthesia Other Findings   Reproductive/Obstetrics                           Anesthesia Physical Anesthesia Plan  ASA: 3  Anesthesia Plan: General   Post-op Pain Management: Tylenol PO (pre-op)*   Induction: Intravenous  PONV Risk Score and Plan: 4 or greater and Ondansetron, Dexamethasone and Scopolamine patch - Pre-op  Airway Management Planned: Oral ETT and Video Laryngoscope Planned  Additional Equipment: None  Intra-op Plan:   Post-operative Plan: Extubation in OR  Informed Consent: I have reviewed the patients History and Physical, chart, labs and discussed the procedure including the risks, benefits and alternatives for the proposed anesthesia with the patient or authorized representative who has indicated his/her understanding and acceptance.     Dental advisory given  Plan Discussed with: CRNA and Surgeon  Anesthesia Plan Comments:        Anesthesia Quick Evaluation

## 2022-01-14 NOTE — H&P (Signed)
Surgical H&P Update  HPI: 66 y.o. 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 No changes in health since they were last seen. Still having hand weakness and gait problems and wishes to proceed with surgery.  PMHx:  Past Medical History:  Diagnosis Date   Anxiety    Arthritis    Complication of anesthesia    Hard to wake up   Diabetes mellitus without complication (HCC)    Type II   Hyperlipidemia    Hypertension    Hypothyroidism    PONV (postoperative nausea and vomiting)    Thyroid disease    Hypo   FamHx:  Family History  Problem Relation Age of Onset   Heart disease Mother    Diabetes Mother    Hypertension Mother    Heart failure Mother    Heart attack Father    Prostate cancer Father    Heart disease Sister    Hypertension Sister    Diabetes Sister    Heart attack Brother    Heart disease Brother    Hypertension Brother    Diabetes Brother    Hypertension Son    Diabetes Brother    Diabetes Brother    Hypertension Sister    SocHx:  reports that she has never smoked. She has never used smokeless tobacco. She reports that she does not drink alcohol and does not use drugs.  Physical Exam: Strength 4+/5 x4, SILTx4 except bilateral stocking / glove numbness with bilateral hoffman's  Assesment/Plan: 66 y.o. 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 Risks, benefits, and alternatives discussed and the patient would like to continue with surgery.  -OR today -3C post-op versus home from [place], MD 01/14/22 1:22 PM

## 2022-01-14 NOTE — Anesthesia Procedure Notes (Signed)
Procedure Name: Intubation Date/Time: 01/14/2022 1:43 PM  Performed by: Jenne Campus, CRNAPre-anesthesia Checklist: Patient identified, Emergency Drugs available, Suction available and Patient being monitored Patient Re-evaluated:Patient Re-evaluated prior to induction Oxygen Delivery Method: Circle System Utilized Preoxygenation: Pre-oxygenation with 100% oxygen Induction Type: IV induction Ventilation: Mask ventilation without difficulty Laryngoscope Size: Glidescope and 3 Grade View: Grade I Tube type: Oral Tube size: 7.0 mm Number of attempts: 1 Airway Equipment and Method: Stylet and Oral airway Placement Confirmation: ETT inserted through vocal cords under direct vision, positive ETCO2 and breath sounds checked- equal and bilateral Secured at: 22 cm Tube secured with: Tape Dental Injury: Teeth and Oropharynx as per pre-operative assessment

## 2022-01-15 ENCOUNTER — Other Ambulatory Visit: Payer: Self-pay | Admitting: Neurological Surgery

## 2022-01-15 LAB — GLUCOSE, CAPILLARY: Glucose-Capillary: 152 mg/dL — ABNORMAL HIGH (ref 70–99)

## 2022-01-15 MED ORDER — DEXAMETHASONE SODIUM PHOSPHATE 10 MG/ML IJ SOLN
8.0000 mg | Freq: Once | INTRAMUSCULAR | Status: AC
  Administered 2022-01-15: 8 mg via INTRAMUSCULAR
  Filled 2022-01-15: qty 1

## 2022-01-15 MED ORDER — DEXAMETHASONE SODIUM PHOSPHATE 10 MG/ML IJ SOLN
INTRAMUSCULAR | Status: AC
  Filled 2022-01-15: qty 1

## 2022-01-15 MED ORDER — PROMETHAZINE HCL 12.5 MG PO TABS: 12.5000 mg | ORAL_TABLET | Freq: Four times a day (QID) | ORAL | 0 refills | Status: AC | PRN

## 2022-01-15 MED ORDER — DEXAMETHASONE SODIUM PHOSPHATE 10 MG/ML IJ SOLN
8.0000 mg | Freq: Once | INTRAMUSCULAR | Status: DC
Start: 2022-01-15 — End: 2022-01-15

## 2022-01-15 NOTE — Discharge Instructions (Signed)
   Call Your Doctor If Any of These Occur Redness, drainage, or swelling at the wound.  Temperature greater than 101 degrees. Severe pain not relieved by pain medication. Incision starts to come apart.  

## 2022-01-15 NOTE — Progress Notes (Signed)
Patient alert and oriented, mae's well, voiding adequate amount of urine, swallowing without difficulty, no c/o pain at time of discharge. Patient discharged home with family. Script and discharged instructions given to patient. Patient and family stated understanding of instructions given. Patient has an appointment with Dr. Ostergard   

## 2022-01-15 NOTE — Plan of Care (Signed)

## 2022-01-15 NOTE — Progress Notes (Addendum)
Neurosurgery Service Progress Note  Subjective: No acute events overnight, subjectively feels like hands are already improved   Objective: Vitals:   01/14/22 1927 01/14/22 2310 01/15/22 0316 01/15/22 0731  BP: 134/78 135/88 115/66 113/70  Pulse: 92 94 84 79  Resp: '18 18 20 18  '$ Temp: 98.4 F (36.9 C) 98.4 F (36.9 C) 98.4 F (36.9 C) 98 F (36.7 C)  TempSrc: Oral Oral Oral Oral  SpO2: 94% 90% 90% 93%  Weight:      Height:        Physical Exam: Strength 4+/5 x4 and SILTx4 except stocking glove numbness, minimal bilatearl hoffman's, incision c/d/I, neck soft  Assessment & Plan: 66 y.o. woman s/p C3-4 ACDF, removal of existing plate C4-C7, recovering well.  -discharge home today, will give one dose of dex to help with dypshagia given the size of the approach  Judith Part  01/15/22 10:50 AM

## 2022-01-15 NOTE — Discharge Summary (Signed)
Discharge Summary  Date of Admission: 01/14/2022  Date of Discharge: 01/15/22  Attending Physician: Emelda Brothers, MD  Hospital Course: Patient was admitted following an uncomplicated O6-7 ACDF and removal of C4-C7 anterior cervical plate. They were recovered in PACU and transferred to [facility] Their preop symptoms were improved, their hospital course was uncomplicated and the patient was discharged home on 01/15/22. They will follow up in clinic with me in clinic in 2 weeks.  Neurologic exam at discharge:  Strength 4+/5 x4, SILTx4 except baseline stocking/glove numbness, minimal hoffman's b/l  Discharge diagnosis: Cervical myelopathy  Judith Part, MD 01/15/22 10:53 AM

## 2022-01-15 NOTE — Evaluation (Signed)
Physical Therapy Evaluation Patient Details Name: Monique Weiss MRN: [id number] DOB: October 03, 1955 Today's Date: 01/15/2022  History of Present Illness  Pt is a 66 y.o female s/p C3-4 ACDF with removal of anterior cervical plate on 2/45/8099. PMH significant for anxiety, arthritis, DMII, HTN, and thyroid disease.  Clinical Impression  PTA pt living alone in single story apartment with level entry. Pt reports she was walking without AD but was experiencing difficulty in feeling the ground with walking, independent in ADLs and iADLs. Pt currently limited in safe mobility by pain, decreased sensation, and associated decreased balance. Pt is mod I for bed mobility and transfers, min A for unsteadiness without RW and supervision for ambulation with RW. Pt would benefit from continued PT acutely to work on gait and balance in the unlikely event she does not discharge home today.      Recommendations for follow up therapy are one component of a multi-disciplinary discharge planning process, led by the attending physician.  Recommendations may be updated based on patient status, additional functional criteria and insurance authorization.  Follow Up Recommendations Follow physician's recommendations for discharge plan and follow up therapies      Assistance Recommended at Discharge Frequent or constant Supervision/Assistance  Patient can return home with the following  A little help with walking and/or transfers;A little help with bathing/dressing/bathroom;Assistance with cooking/housework;Assist for transportation;Help with stairs or ramp for entrance    Equipment Recommendations Rolling walker (2 wheels)  Recommendations for Other Services       Functional Status Assessment Patient has had a recent decline in their functional status and demonstrates the ability to make significant improvements in function in a reasonable and predictable amount of time.     Precautions / Restrictions  Precautions Precautions: Cervical Precaution Booklet Issued: Yes (comment) Precaution Comments: pt able to recall 3/3 precautions Required Braces or Orthoses:  (no brace) Restrictions Weight Bearing Restrictions: No      Mobility  Bed Mobility Overal bed mobility: Modified Independent                  Transfers Overall transfer level: Modified independent                      Ambulation/Gait Ambulation/Gait assistance: Min assist, Supervision Gait Distance (Feet): 500 Feet Assistive device: None, Rolling walker (2 wheels) Gait Pattern/deviations: Step-through pattern, Decreased dorsiflexion - left Gait velocity: WFL Gait velocity interpretation: 1.31 - 2.62 ft/sec, indicative of limited community ambulator   General Gait Details: initial attempt without AD, pt with missteps requiring light min A for steadying, pt reports feels like she can not feel the ground, provided RW and despite neuropathy pt able to ambulate with improved steadiness.        Balance Overall balance assessment: Mild deficits observed, not formally tested                                           Pertinent Vitals/Pain Pain Assessment Pain Assessment: 0-10 Pain Score: 6  Faces Pain Scale: Hurts a little bit Pain Location: neck Pain Descriptors / Indicators: Discomfort, Operative site guarding Pain Intervention(s): Limited activity within patient's tolerance, Monitored during session, Repositioned    Home Living Family/patient expects to be discharged to:: Private residence Living Arrangements: Alone Available Help at Discharge: Family;Available PRN/intermittently Type of Home: Apartment Home Access: Level entry  Home Layout: One level Home Equipment: Conservation officer, nature (2 wheels);BSC/3in1;Shower seat - built in;Grab bars - tub/shower Additional Comments: son lives in apartment upstairs, pt has handicapped apartment    Prior Function Prior Level of  Function : Independent/Modified Independent;Driving                     Hand Dominance   Dominant Hand: Right    Extremity/Trunk Assessment   Upper Extremity Assessment Upper Extremity Assessment: Defer to OT evaluation LUE Deficits / Details: L hand numbness/tingling but reporting improved since surgery    Lower Extremity Assessment Lower Extremity Assessment: RLE deficits/detail;LLE deficits/detail RLE Deficits / Details: generalized weakness, ROM WFL RLE Sensation: decreased light touch;decreased proprioception RLE Coordination: decreased fine motor LLE Deficits / Details: generalized weakness, ROM WFL LLE Sensation: decreased light touch LLE Coordination: decreased fine motor    Cervical / Trunk Assessment Cervical / Trunk Assessment: Neck Surgery  Communication   Communication: No difficulties  Cognition Arousal/Alertness: Awake/alert Behavior During Therapy: WFL for tasks assessed/performed Overall Cognitive Status: Within Functional Limits for tasks assessed                                          General Comments General comments (skin integrity, edema, etc.): Provided education on possible PT referral once sx healed for improving gait and balance if neuropathy continues, 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        Assessment/Plan    PT Assessment Patient needs continued PT services  PT Problem List Decreased balance;Decreased mobility;Pain       PT Treatment Interventions DME instruction;Gait training;Functional mobility training;Therapeutic activities;Therapeutic exercise;Balance training;Cognitive remediation;Patient/family education    PT Goals (Current goals can be found in the Care Plan section)  Acute Rehab PT Goals Patient Stated Goal: walk better PT Goal Formulation: With patient/family Time For Goal Achievement: 01/29/22 Potential to Achieve Goals: Good    Frequency Min 5X/week        AM-PAC PT "6  Clicks" Mobility  Outcome Measure Help needed turning from your back to your side while in a flat bed without using bedrails?: None Help needed moving from lying on your back to sitting on the side of a flat bed without using bedrails?: None Help needed moving to and from a bed to a chair (including a wheelchair)?: None Help needed standing up from a chair using your arms (e.g., wheelchair or bedside chair)?: None Help needed to walk in hospital room?: A Little Help needed climbing 3-5 steps with a railing? : A Little 6 Click Score: 22    End of Session     Patient left: in bed;with call bell/phone within reach;with nursing/sitter in room Nurse Communication: Mobility status;Patient requests pain meds PT Visit Diagnosis: Muscle weakness (generalized) (M62.81);Other abnormalities of gait and mobility (R26.89);Unsteadiness on feet (R26.81)    Time: 6734-1937 PT Time Calculation (min) (ACUTE ONLY): 18 min   Charges:   PT Evaluation $PT Eval Low Complexity: 1 Low          Deyani Hegarty B. Migdalia Dk PT, DPT Acute Rehabilitation Services Please use secure chat or  Call Office [phone]    [place] 01/15/2022, 11:24 AM

## 2022-01-15 NOTE — Evaluation (Signed)
Occupational Therapy Evaluation Patient Details Name: Monique Weiss MRN: [id number] DOB: [birth year] Today's Date: 01/15/2022   History of Present Illness Pt is a 66 y.o female s/p C3-4 ACDF with removal of anterior cervical plate on 2/70/3500. PMH significant for anxiety, arthritis, DMII, HTN, and thyroid disease.   Clinical Impression   PTA patient independent.  Admitted for above and presents near baseline modified independence for ADLs after education on cervical precautions and ADL compensatory techniques.  Supervision for mobility with mild unsteadiness but no losses of balance. She has good support at home from son as needed. Discussed safety, precautions, DME and recommendations.  No further OT needs identified.  OT will sign off.      Recommendations for follow up therapy are one component of a multi-disciplinary discharge planning process, led by the attending physician.  Recommendations may be updated based on patient status, additional functional criteria and insurance authorization.   Follow Up Recommendations  No OT follow up    Assistance Recommended at Discharge PRN  Patient can return home with the following Assistance with cooking/housework;Assist for transportation    Functional Status Assessment  Patient has had a recent decline in their functional status and demonstrates the ability to make significant improvements in function in a reasonable and predictable amount of time.  Equipment Recommendations  None recommended by OT    Recommendations for Other Services PT consult     Precautions / Restrictions Precautions Precautions: Cervical Precaution Booklet Issued: Yes (comment) Precaution Comments: provided handout, good understanding Required Braces or Orthoses:  (no brace) Restrictions Weight Bearing Restrictions: No      Mobility Bed Mobility               General bed mobility comments: OOB in recliner upon entry    Transfers Overall transfer  level: Modified independent                        Balance Overall balance assessment: No apparent balance deficits (not formally assessed)                                         ADL either performed or assessed with clinical judgement   ADL Overall ADL's : Modified independent                                       General ADL Comments: demonstrates independence with ADLs, mobility after education on compensatory techniques     Vision   Vision Assessment?: No apparent visual deficits     Perception     Praxis      Pertinent Vitals/Pain Pain Assessment Pain Assessment: Faces Faces Pain Scale: Hurts a little bit Pain Location: neck Pain Descriptors / Indicators: Discomfort, Operative site guarding Pain Intervention(s): Limited activity within patient's tolerance, Monitored during session, Repositioned     Hand Dominance Right   Extremity/Trunk Assessment Upper Extremity Assessment Upper Extremity Assessment: Overall WFL for tasks assessed;LUE deficits/detail LUE Deficits / Details: L hand numbness/tingling but reporting improved since surgery   Lower Extremity Assessment Lower Extremity Assessment: Defer to PT evaluation   Cervical / Trunk Assessment Cervical / Trunk Assessment: Neck Surgery   Communication Communication Communication: No difficulties   Cognition Arousal/Alertness: Awake/alert Behavior During Therapy: WFL for tasks assessed/performed Overall Cognitive Status:  Within Functional Limits for tasks assessed                                       General Comments       Exercises     Shoulder Instructions      Home Living Family/patient expects to be discharged to:: Private residence Living Arrangements: Alone Available Help at Discharge: Family;Available PRN/intermittently Type of Home: Apartment Home Access: Level entry     Home Layout: One level     Bathroom Shower/Tub:  Occupational psychologist: Handicapped height     Home Equipment: Conservation officer, nature (2 wheels);BSC/3in1;Shower seat - built in;Grab bars - tub/shower   Additional Comments: son lives in apartment upstairs, pt has handicapped apartment      Prior Functioning/Environment Prior Level of Function : Independent/Modified Independent;Driving                        OT Problem List: Decreased knowledge of use of DME or AE;Decreased knowledge of precautions;Pain;Obesity      OT Treatment/Interventions:      OT Goals(Current goals can be found in the care plan section) Acute Rehab OT Goals Patient Stated Goal: home OT Goal Formulation: With patient  OT Frequency:      Co-evaluation              AM-PAC OT "6 Clicks" Daily Activity     Outcome Measure Help from another person eating meals?: None Help from another person taking care of personal grooming?: None Help from another person toileting, which includes using toliet, bedpan, or urinal?: None Help from another person bathing (including washing, rinsing, drying)?: None Help from another person to put on and taking off regular upper body clothing?: None Help from another person to put on and taking off regular lower body clothing?: None 6 Click Score: 24   End of Session Nurse Communication: Mobility status  Activity Tolerance: Patient tolerated treatment well Patient left: in chair;with call bell/phone within reach  OT Visit Diagnosis: Other abnormalities of gait and mobility (R26.89);Pain Pain - part of body:  (neck)                Time: 7412-8786 OT Time Calculation (min): 15 min Charges:  OT General Charges $OT Visit: 1 Visit OT Evaluation $OT Eval Low Complexity: [place], OT Acute Rehabilitation Services Office [phone]   Delight Stare 01/15/2022, 10:57 AM

## 2022-01-15 NOTE — Progress Notes (Signed)
Orthopedic Tech Progress Note Patient Details:  Monique Weiss 1955/11/13 [id number]  Ortho Devices Type of Ortho Device: Soft collar Ortho Device/Splint Location: NECK Ortho Device/Splint Interventions: Ordered, Application, Adjustment, Removal   Post Interventions Patient Tolerated: Well Instructions Provided: Care of device  Janit Pagan 01/15/2022, 11:23 AM

## 2022-01-22 DIAGNOSIS — G5603 Carpal tunnel syndrome, bilateral upper limbs: Secondary | ICD-10-CM | POA: Diagnosis not present

## 2022-01-22 DIAGNOSIS — M0609 Rheumatoid arthritis without rheumatoid factor, multiple sites: Secondary | ICD-10-CM | POA: Diagnosis not present

## 2022-01-22 DIAGNOSIS — M1991 Primary osteoarthritis, unspecified site: Secondary | ICD-10-CM | POA: Diagnosis not present

## 2022-01-22 DIAGNOSIS — R768 Other specified abnormal immunological findings in serum: Secondary | ICD-10-CM | POA: Diagnosis not present

## 2022-01-22 DIAGNOSIS — M542 Cervicalgia: Secondary | ICD-10-CM | POA: Diagnosis not present

## 2022-01-24 ENCOUNTER — Encounter (HOSPITAL_COMMUNITY): Payer: Self-pay | Admitting: Neurological Surgery

## 2022-02-28 DIAGNOSIS — K631 Perforation of intestine (nontraumatic): Secondary | ICD-10-CM | POA: Diagnosis not present

## 2022-02-28 DIAGNOSIS — K529 Noninfective gastroenteritis and colitis, unspecified: Secondary | ICD-10-CM | POA: Diagnosis not present

## 2022-02-28 DIAGNOSIS — M4322 Fusion of spine, cervical region: Secondary | ICD-10-CM | POA: Diagnosis not present

## 2022-02-28 DIAGNOSIS — R109 Unspecified abdominal pain: Secondary | ICD-10-CM | POA: Diagnosis not present

## 2022-03-07 DIAGNOSIS — Z23 Encounter for immunization: Secondary | ICD-10-CM | POA: Diagnosis not present

## 2022-03-08 DIAGNOSIS — E118 Type 2 diabetes mellitus with unspecified complications: Secondary | ICD-10-CM | POA: Diagnosis not present

## 2022-03-08 DIAGNOSIS — R609 Edema, unspecified: Secondary | ICD-10-CM | POA: Diagnosis not present

## 2022-03-08 DIAGNOSIS — M069 Rheumatoid arthritis, unspecified: Secondary | ICD-10-CM | POA: Diagnosis not present

## 2022-03-08 DIAGNOSIS — Z79899 Other long term (current) drug therapy: Secondary | ICD-10-CM | POA: Diagnosis not present

## 2022-03-21 DIAGNOSIS — M069 Rheumatoid arthritis, unspecified: Secondary | ICD-10-CM | POA: Diagnosis not present

## 2022-03-21 DIAGNOSIS — R7309 Other abnormal glucose: Secondary | ICD-10-CM | POA: Diagnosis not present

## 2022-03-21 DIAGNOSIS — E1169 Type 2 diabetes mellitus with other specified complication: Secondary | ICD-10-CM | POA: Diagnosis not present

## 2022-03-21 DIAGNOSIS — R609 Edema, unspecified: Secondary | ICD-10-CM | POA: Diagnosis not present

## 2022-03-23 DIAGNOSIS — I10 Essential (primary) hypertension: Secondary | ICD-10-CM | POA: Diagnosis not present

## 2022-03-23 DIAGNOSIS — E1169 Type 2 diabetes mellitus with other specified complication: Secondary | ICD-10-CM | POA: Diagnosis not present

## 2022-03-23 DIAGNOSIS — E785 Hyperlipidemia, unspecified: Secondary | ICD-10-CM | POA: Diagnosis not present

## 2022-05-02 ENCOUNTER — Other Ambulatory Visit (HOSPITAL_COMMUNITY): Payer: Self-pay | Admitting: Internal Medicine

## 2022-05-02 DIAGNOSIS — Z1231 Encounter for screening mammogram for malignant neoplasm of breast: Secondary | ICD-10-CM

## 2022-05-13 ENCOUNTER — Ambulatory Visit (HOSPITAL_COMMUNITY): Payer: Medicare Other

## 2022-05-15 ENCOUNTER — Ambulatory Visit (HOSPITAL_COMMUNITY)
Admission: RE | Admit: 2022-05-15 | Discharge: 2022-05-15 | Disposition: A | Discharge status: Home / Self Care | Payer: Medicare Other | Source: Ambulatory Visit | Attending: Internal Medicine | Admitting: Internal Medicine

## 2022-05-15 DIAGNOSIS — Z1231 Encounter for screening mammogram for malignant neoplasm of breast: Secondary | ICD-10-CM | POA: Insufficient documentation

## 2022-05-24 ENCOUNTER — Ambulatory Visit (HOSPITAL_COMMUNITY): Payer: Medicare Other

## 2022-07-02 DIAGNOSIS — M069 Rheumatoid arthritis, unspecified: Secondary | ICD-10-CM | POA: Diagnosis not present

## 2022-07-02 DIAGNOSIS — Z79899 Other long term (current) drug therapy: Secondary | ICD-10-CM | POA: Diagnosis not present

## 2022-07-02 DIAGNOSIS — I10 Essential (primary) hypertension: Secondary | ICD-10-CM | POA: Diagnosis not present

## 2022-07-02 DIAGNOSIS — R609 Edema, unspecified: Secondary | ICD-10-CM | POA: Diagnosis not present

## 2022-07-02 DIAGNOSIS — E118 Type 2 diabetes mellitus with unspecified complications: Secondary | ICD-10-CM | POA: Diagnosis not present

## 2022-07-22 DIAGNOSIS — E1169 Type 2 diabetes mellitus with other specified complication: Secondary | ICD-10-CM | POA: Diagnosis not present

## 2022-07-22 DIAGNOSIS — R609 Edema, unspecified: Secondary | ICD-10-CM | POA: Diagnosis not present

## 2022-07-22 DIAGNOSIS — R7309 Other abnormal glucose: Secondary | ICD-10-CM | POA: Diagnosis not present

## 2022-07-22 DIAGNOSIS — E039 Hypothyroidism, unspecified: Secondary | ICD-10-CM | POA: Diagnosis not present

## 2022-07-25 DIAGNOSIS — M542 Cervicalgia: Secondary | ICD-10-CM | POA: Diagnosis not present

## 2022-07-25 DIAGNOSIS — M0609 Rheumatoid arthritis without rheumatoid factor, multiple sites: Secondary | ICD-10-CM | POA: Diagnosis not present

## 2022-07-25 DIAGNOSIS — M1991 Primary osteoarthritis, unspecified site: Secondary | ICD-10-CM | POA: Diagnosis not present

## 2022-07-25 DIAGNOSIS — R768 Other specified abnormal immunological findings in serum: Secondary | ICD-10-CM | POA: Diagnosis not present

## 2022-07-25 DIAGNOSIS — G5603 Carpal tunnel syndrome, bilateral upper limbs: Secondary | ICD-10-CM | POA: Diagnosis not present

## 2022-11-12 DIAGNOSIS — M0609 Rheumatoid arthritis without rheumatoid factor, multiple sites: Secondary | ICD-10-CM | POA: Diagnosis not present

## 2022-11-12 DIAGNOSIS — M542 Cervicalgia: Secondary | ICD-10-CM | POA: Diagnosis not present

## 2022-11-12 DIAGNOSIS — G5603 Carpal tunnel syndrome, bilateral upper limbs: Secondary | ICD-10-CM | POA: Diagnosis not present

## 2022-11-12 DIAGNOSIS — R5383 Other fatigue: Secondary | ICD-10-CM | POA: Diagnosis not present

## 2022-11-12 DIAGNOSIS — R768 Other specified abnormal immunological findings in serum: Secondary | ICD-10-CM | POA: Diagnosis not present

## 2022-11-12 DIAGNOSIS — M1991 Primary osteoarthritis, unspecified site: Secondary | ICD-10-CM | POA: Diagnosis not present

## 2022-11-13 DIAGNOSIS — E118 Type 2 diabetes mellitus with unspecified complications: Secondary | ICD-10-CM | POA: Diagnosis not present

## 2022-11-19 DIAGNOSIS — R7309 Other abnormal glucose: Secondary | ICD-10-CM | POA: Diagnosis not present

## 2022-11-19 DIAGNOSIS — M069 Rheumatoid arthritis, unspecified: Secondary | ICD-10-CM | POA: Diagnosis not present

## 2022-11-19 DIAGNOSIS — E1169 Type 2 diabetes mellitus with other specified complication: Secondary | ICD-10-CM | POA: Diagnosis not present

## 2022-12-17 ENCOUNTER — Ambulatory Visit: Payer: 59 | Admitting: General Surgery

## 2023-01-14 ENCOUNTER — Ambulatory Visit (INDEPENDENT_AMBULATORY_CARE_PROVIDER_SITE_OTHER): Payer: 59 | Admitting: General Surgery

## 2023-01-14 ENCOUNTER — Encounter: Payer: Self-pay | Admitting: General Surgery

## 2023-01-14 VITALS — BP 134/83 | HR 63 | Temp 98.1°F | Resp 12 | Ht 68.0 in | Wt 226.0 lb

## 2023-01-14 DIAGNOSIS — K439 Ventral hernia without obstruction or gangrene: Secondary | ICD-10-CM | POA: Diagnosis not present

## 2023-01-14 NOTE — Progress Notes (Unsigned)
[facility] History and Physical  Reason for Referral:*** Referring Physician: ***  Chief Complaint   New Patient (Initial Visit)     Monique Weiss is a 67 y.o. female.  HPI: ***.  The *** started *** and has had a duration of ***.  It is associated with ***.  The *** is improved with ***, and is made worse with ***.    Quality*** Context***  Past Medical History:  Diagnosis Date   Anxiety    Arthritis    Complication of anesthesia    Hard to wake up   Diabetes mellitus without complication (HCC)    Type II   Hyperlipidemia    Hypertension    Hypothyroidism    PONV (postoperative nausea and vomiting)    Thyroid disease    Hypo    Past Surgical History:  Procedure Laterality Date   ANTERIOR CERVICAL CORPECTOMY N/A 07/27/2018   Procedure: Cervical five Anterior cervical corpectomy with Cervical six-seven Anterior cervical discectomy and fusion;  Surgeon: Jadene Pierini, MD;  Location: [facility] OR;  Service: Neurosurgery;  Laterality: N/A;   ANTERIOR CERVICAL DECOMP/DISCECTOMY FUSION N/A 01/14/2022   Procedure: Cervical Three-Four  Anterior Cervical Discectomy Fusion with Removal of anterior cervical plate;  Surgeon: Jadene Pierini, MD;  Location: [facility] OR;  Service: Neurosurgery;  Laterality: N/A;  Cervical Three-Four  Anterior Cervical Discectomy Fusion with Removal of anterior cervical plate   BACK SURGERY      fusions x3   BELPHAROPTOSIS REPAIR     Bilateral   CATARACT EXTRACTION W/PHACO Right 11/09/2021   Procedure: CATARACT EXTRACTION PHACO AND INTRAOCULAR LENS PLACEMENT (IOC);  Surgeon: Fabio Pierce, MD;  Location: [facility] ORS;  Service: Ophthalmology;  Laterality: Right;  CDE 26.95   CATARACT EXTRACTION W/PHACO Left 12/03/2021   Procedure: CATARACT EXTRACTION PHACO AND INTRAOCULAR LENS PLACEMENT (IOC);  Surgeon: Fabio Pierce, MD;  Location: [facility]S;  Service: Ophthalmology;  Laterality: Left;  CDE 23.25   CHOLECYSTECTOMY N/A 01/14/2018   Procedure:  LAPAROSCOPIC CHOLECYSTECTOMY;  Surgeon: Lucretia Roers, MD;  Location: [facility];  Service: General;  Laterality: N/A;   DILATION AND CURETTAGE OF UTERUS     EYE SURGERY     JOINT REPLACEMENT Bilateral    knees   LAPAROTOMY N/A 02/09/2021   Procedure: EXPLORATORY LAPAROTOMY;  Surgeon: Lucretia Roers, MD;  Location: [facility];  Service: General;  Laterality: N/A;   REPLACEMENT TOTAL KNEE     SHOULDER SURGERY Left    bone spur removal    Family History  Problem Relation Age of Onset   Heart disease Mother    Diabetes Mother    Hypertension Mother    Heart failure Mother    Heart attack Father    Prostate cancer Father    Heart disease Sister    Hypertension Sister    Diabetes Sister    Heart attack Brother    Heart disease Brother    Hypertension Brother    Diabetes Brother    Hypertension Son    Diabetes Brother    Diabetes Brother    Hypertension Sister     Social History   Tobacco Use   Smoking status: Never   Smokeless tobacco: Never  Vaping Use   Vaping status: Never Used  Substance Use Topics   Alcohol use: Never   Drug use: Never    Medications: {medication reviewed/display:3041432} Allergies as of 01/14/2023       Reactions   Xeljanz [tofacitinib] Shortness Of Breath, Nausea  Only, Swelling, Other (See Comments)   Headaches   Motrin [ibuprofen] Swelling   Pt takes often   Other Nausea And Vomiting   Pain medication - nausea, vomiting        Medication List        Accurate as of January 14, 2023 11:10 AM. If you have any questions, ask your nurse or doctor.          STOP taking these medications    hydroxychloroquine 200 MG tablet Commonly known as: PLAQUENIL Stopped by: Lucretia Roers       TAKE these medications    acetaminophen 500 MG tablet Commonly known as: TYLENOL Take 1,000 mg by mouth 2 (two) times daily as needed.   atorvastatin 40 MG tablet Commonly known as: LIPITOR Take 40 mg by mouth at bedtime.   docusate  sodium 100 MG capsule Commonly known as: COLACE Take 200 mg by mouth 2 (two) times daily.   furosemide 40 MG tablet Commonly known as: LASIX Take 80 mg by mouth daily.   gabapentin 300 MG capsule Commonly known as: NEURONTIN Take 300 mg by mouth daily as needed (pain).   ibuprofen 200 MG tablet Commonly known as: ADVIL Take 400 mg by mouth 2 (two) times daily as needed.   Ilevro 0.3 % ophthalmic suspension Generic drug: nepafenac Place 1 drop into both eyes daily.   levothyroxine 88 MCG tablet Commonly known as: SYNTHROID Take 88 mcg by mouth daily.   linaclotide 145 MCG Caps capsule Commonly known as: LINZESS Take 145 mcg by mouth daily as needed (constipation).   metFORMIN 1000 MG tablet Commonly known as: GLUCOPHAGE Take 1,000 mg by mouth 2 (two) times daily with a meal.   Orencia ClickJect 125 MG/ML Soaj Generic drug: Abatacept Inject 125 mcg into the skin every Sunday.   oxyCODONE-acetaminophen 5-325 MG tablet Commonly known as: Percocet Take 1 tablet by mouth every 4 (four) hours as needed (pain).   prednisoLONE acetate 1 % ophthalmic suspension Commonly known as: PRED FORTE Place 1 drop into both eyes 2 (two) times daily.   promethazine 12.5 MG tablet Commonly known as: PHENERGAN Take 1 tablet (12.5 mg total) by mouth every 6 (six) hours as needed for nausea or vomiting.   promethazine 12.5 MG tablet Commonly known as: PHENERGAN Take 1 tablet (12.5 mg total) by mouth every 6 (six) hours as needed for nausea or vomiting.   SALONPAS EX Apply 1 patch topically daily as needed (pain).   sertraline 100 MG tablet Commonly known as: ZOLOFT Take 100 mg by mouth daily.   spironolactone 25 MG tablet Commonly known as: ALDACTONE Take 25 mg by mouth every other day.         ROS:  {Review of Systems:30496}  Blood pressure 134/83, pulse 63, temperature 98.1 F (36.7 C), temperature source Oral, resp. rate 12, height 5\' 8"  (1.727 m), weight 226 lb  (102.5 kg), SpO2 91%. Physical Exam  Results: No results found for this or any previous visit (from the past 48 hour(s)).  No results found.   Assessment & Plan:  Monique Weiss is a 67 y.o. female with *** -*** -*** -Follow up ***  All questions were answered to the satisfaction of the patient and family***.  The risk and benefits of *** were discussed including but not limited to ***.  After careful consideration, Monique Weiss has decided to ***.    Lucretia Roers 01/14/2023, 11:10 AM

## 2023-01-14 NOTE — Patient Instructions (Signed)
Call if you decide you want to pursue hernia repair.

## 2023-02-06 DIAGNOSIS — E118 Type 2 diabetes mellitus with unspecified complications: Secondary | ICD-10-CM | POA: Diagnosis not present

## 2023-02-11 DIAGNOSIS — M25512 Pain in left shoulder: Secondary | ICD-10-CM | POA: Diagnosis not present

## 2023-02-12 DIAGNOSIS — R5383 Other fatigue: Secondary | ICD-10-CM | POA: Diagnosis not present

## 2023-02-12 DIAGNOSIS — R768 Other specified abnormal immunological findings in serum: Secondary | ICD-10-CM | POA: Diagnosis not present

## 2023-02-12 DIAGNOSIS — M542 Cervicalgia: Secondary | ICD-10-CM | POA: Diagnosis not present

## 2023-02-12 DIAGNOSIS — G5603 Carpal tunnel syndrome, bilateral upper limbs: Secondary | ICD-10-CM | POA: Diagnosis not present

## 2023-02-12 DIAGNOSIS — M0609 Rheumatoid arthritis without rheumatoid factor, multiple sites: Secondary | ICD-10-CM | POA: Diagnosis not present

## 2023-02-12 DIAGNOSIS — M1991 Primary osteoarthritis, unspecified site: Secondary | ICD-10-CM | POA: Diagnosis not present

## 2023-03-25 DIAGNOSIS — Z23 Encounter for immunization: Secondary | ICD-10-CM | POA: Diagnosis not present

## 2023-05-13 DIAGNOSIS — M0609 Rheumatoid arthritis without rheumatoid factor, multiple sites: Secondary | ICD-10-CM | POA: Diagnosis not present

## 2023-05-13 DIAGNOSIS — R768 Other specified abnormal immunological findings in serum: Secondary | ICD-10-CM | POA: Diagnosis not present

## 2023-05-13 DIAGNOSIS — M79642 Pain in left hand: Secondary | ICD-10-CM | POA: Diagnosis not present

## 2023-05-13 DIAGNOSIS — R5383 Other fatigue: Secondary | ICD-10-CM | POA: Diagnosis not present

## 2023-05-13 DIAGNOSIS — M79641 Pain in right hand: Secondary | ICD-10-CM | POA: Diagnosis not present

## 2023-05-13 DIAGNOSIS — M1991 Primary osteoarthritis, unspecified site: Secondary | ICD-10-CM | POA: Diagnosis not present

## 2023-05-13 DIAGNOSIS — M542 Cervicalgia: Secondary | ICD-10-CM | POA: Diagnosis not present

## 2023-05-13 DIAGNOSIS — G5603 Carpal tunnel syndrome, bilateral upper limbs: Secondary | ICD-10-CM | POA: Diagnosis not present

## 2023-06-30 ENCOUNTER — Other Ambulatory Visit (HOSPITAL_COMMUNITY): Payer: Self-pay | Admitting: Internal Medicine

## 2023-06-30 DIAGNOSIS — Z1231 Encounter for screening mammogram for malignant neoplasm of breast: Secondary | ICD-10-CM

## 2023-07-09 ENCOUNTER — Ambulatory Visit (HOSPITAL_COMMUNITY): Payer: 59

## 2023-07-10 ENCOUNTER — Other Ambulatory Visit (HOSPITAL_COMMUNITY): Payer: Self-pay | Admitting: Internal Medicine

## 2023-07-10 DIAGNOSIS — Z87440 Personal history of urinary (tract) infections: Secondary | ICD-10-CM

## 2023-07-21 ENCOUNTER — Ambulatory Visit (HOSPITAL_COMMUNITY): Payer: 59

## 2023-07-21 ENCOUNTER — Encounter (HOSPITAL_COMMUNITY): Payer: Self-pay

## 2023-07-28 ENCOUNTER — Ambulatory Visit (HOSPITAL_COMMUNITY)
Admission: RE | Admit: 2023-07-28 | Discharge: 2023-07-28 | Disposition: A | Discharge status: Home / Self Care | Payer: 59 | Source: Ambulatory Visit | Attending: Internal Medicine | Admitting: Internal Medicine

## 2023-07-28 DIAGNOSIS — K760 Fatty (change of) liver, not elsewhere classified: Secondary | ICD-10-CM | POA: Insufficient documentation

## 2023-07-28 DIAGNOSIS — Z87440 Personal history of urinary (tract) infections: Secondary | ICD-10-CM | POA: Insufficient documentation

## 2023-07-28 DIAGNOSIS — N390 Urinary tract infection, site not specified: Secondary | ICD-10-CM | POA: Diagnosis not present

## 2023-08-19 DIAGNOSIS — N3000 Acute cystitis without hematuria: Secondary | ICD-10-CM | POA: Diagnosis not present

## 2023-09-15 ENCOUNTER — Ambulatory Visit (HOSPITAL_COMMUNITY): Payer: 59

## 2023-09-17 DIAGNOSIS — D485 Neoplasm of uncertain behavior of skin: Secondary | ICD-10-CM | POA: Diagnosis not present

## 2023-09-17 DIAGNOSIS — L821 Other seborrheic keratosis: Secondary | ICD-10-CM | POA: Diagnosis not present

## 2023-09-23 ENCOUNTER — Ambulatory Visit: Admitting: General Surgery

## 2023-10-13 ENCOUNTER — Ambulatory Visit (HOSPITAL_COMMUNITY)
Admission: RE | Admit: 2023-10-13 | Discharge: 2023-10-13 | Disposition: A | Discharge status: Home / Self Care | Source: Ambulatory Visit | Attending: Internal Medicine | Admitting: Internal Medicine

## 2023-10-13 ENCOUNTER — Encounter (HOSPITAL_COMMUNITY): Payer: Self-pay

## 2023-10-13 DIAGNOSIS — Z1231 Encounter for screening mammogram for malignant neoplasm of breast: Secondary | ICD-10-CM | POA: Diagnosis not present

## 2023-10-14 ENCOUNTER — Encounter: Payer: Self-pay | Admitting: General Surgery

## 2023-10-14 ENCOUNTER — Ambulatory Visit: Admitting: General Surgery

## 2023-10-14 VITALS — BP 131/82 | HR 74 | Temp 98.2°F | Resp 14 | Ht 68.0 in | Wt 234.0 lb

## 2023-10-14 DIAGNOSIS — K439 Ventral hernia without obstruction or gangrene: Secondary | ICD-10-CM | POA: Diagnosis not present

## 2023-10-14 NOTE — Patient Instructions (Signed)
 Monique Weiss  10/14/2023     @PREFPERIOPPHARMACY @   Your procedure is scheduled on  10/21/2023.   Report to [facility] at  [phone] A.M.   Call this number if you have problems the morning of surgery:  [phone]  If you experience any cold or flu symptoms such as cough, fever, chills, shortness of breath, etc. between now and your scheduled surgery, please notify us  at the above number.   Remember:  Do not eat after midnight.    You may drink clear liquids until  0430 am on 10/21/2023.    Clear liquids allowed are:                    Water , Juice (No red color; non-citric and without pulp; diabetics please choose diet or no sugar options), Carbonated beverages (diabetics please choose diet or no sugar options), Clear Tea (No creamer, milk, or cream, including half & half and powdered creamer), Black Coffee Only (No creamer, milk or cream, including half & half and powdered creamer), and Clear Sports drink (No red color; diabetics please choose diet or no sugar options)    Take these medicines the morning of surgery with A SIP OF WATER                                            levothyroxine , sertraline .    Do not wear jewelry, make-up or nail polish, including gel polish,  artificial nails, or any other type of covering on natural nails (fingers and  toes).  Do not wear lotions, powders, or perfumes, or deodorant.  Do not shave 48 hours prior to surgery.  Men may shave face and neck.  Do not bring valuables to the hospital.  [facility] is not responsible for any belongings or valuables.  Contacts, dentures or bridgework may not be worn into surgery.  Leave your suitcase in the car.  After surgery it may be brought to your room.  For patients admitted to the hospital, discharge time will be determined by your treatment team.  Patients discharged the day of surgery will not be allowed to drive home and must have someone with them for 24 hours.    Special  instructions:   DO NOT smoke tobacco or vape for 24 hours before your procedure.  Please read over the following fact sheets that you were given. Coughing and Deep Breathing, Surgical Site Infection Prevention, Anesthesia Post-op Instructions, and Care and Recovery After Surgery        Laparoscopic Surgery for Belly Hernias: What to Know After After the procedure, it's common to have pain, discomfort, or soreness. Follow these instructions at home: Medicines Take your medicines only as told. You may need to take steps to help treat or prevent trouble pooping (constipation), such as: Taking medicines to help you poop. Eating foods high in fiber, like beans, whole grains, and fresh fruits and vegetables. Drinking more fluids as told. Ask your health care provider if it's safe to drive or use machines while taking your medicine. Incision care  Take care of the cuts in your belly as told. Make sure you: Wash your hands with soap and water  for at least 20 seconds before and after you change your bandage. If you can't use soap and water , use hand sanitizer. Change your bandage. Leave stitches or skin  glue alone. Leave tape strips alone unless you're told to take them off. You may trim the edges of the tape strips if they curl up. Check the cuts on your belly every day for signs of infection. Check for: More redness, swelling, or pain. More fluid or blood. Warmth. Pus or a bad smell. Activity Rest as told. Get up to take short walks at least every 2 hours during the day. This helps you breathe better and keeps your blood flowing. Ask for help if you feel weak or unsteady. Do not take baths, swim, or use a hot tub until you're told it's OK. Ask if you can shower. Ask if it's OK for you to lift. If you were given a sedative, do not drive or use machines until you're told it's safe. A sedative can make you sleepy. Ask what things are safe for you to do at home. Ask when you can go back to  work or school. General instructions Hold a pillow over your belly when you cough or sneeze. This helps with pain. Wear a binder around your belly as told by your provider. Do not smoke, vape, or use nicotine or tobacco. Wear compression stockings to reduce swelling and help prevent blood clots in your legs. You may be asked to continue to do deep breathing exercises at home. This will help to prevent a lung infection. Contact a health care provider if: You have any signs of infection. You have pain that gets worse or does not get better with medicine. You throw up or you feel like throwing up. You have a cough. You have not pooped in 3 days. You are not able to pee. You have a fever. Get help right away if: You have very bad pain in your belly. You throw up every time you eat or drink. You have redness, warmth, or pain in your leg. You have chest pain. You have trouble breathing. These symptoms may be an emergency. Call 911 right away. Do not wait to see if the symptoms will go away. Do not drive yourself to the hospital. This information is not intended to replace advice given to you by your health care provider. Make sure you discuss any questions you have with your health care provider. Document Revised: 12/17/2022 Document Reviewed: 12/17/2022 Elsevier Patient Education  2024 [employer].General Anesthesia, Adult, Care After The following information offers guidance on how to care for yourself after your procedure. Your health care provider may also give you more specific instructions. If you have problems or questions, contact your health care provider. What can I expect after the procedure? After the procedure, it is common for people to: Have pain or discomfort at the IV site. Have nausea or vomiting. Have a sore throat or hoarseness. Have trouble concentrating. Feel cold or chills. Feel weak, sleepy, or tired (fatigue). Have soreness and body aches. These can affect  parts of the body that were not involved in surgery. Follow these instructions at home: For the time period you were told by your health care provider:  Rest. Do not participate in activities where you could fall or become injured. Do not drive or use machinery. Do not drink alcohol. Do not take sleeping pills or medicines that cause drowsiness. Do not make important decisions or sign legal documents. Do not take care of children on your own. General instructions Drink enough fluid to keep your urine pale yellow. If you have sleep apnea, surgery and certain medicines can increase your risk  for breathing problems. Follow instructions from your health care provider about wearing your sleep device: Anytime you are sleeping, including during daytime naps. While taking prescription pain medicines, sleeping medicines, or medicines that make you drowsy. Return to your normal activities as told by your health care provider. Ask your health care provider what activities are safe for you. Take over-the-counter and prescription medicines only as told by your health care provider. Do not use any products that contain nicotine or tobacco. These products include cigarettes, chewing tobacco, and vaping devices, such as e-cigarettes. These can delay incision healing after surgery. If you need help quitting, ask your health care provider. Contact a health care provider if: You have nausea or vomiting that does not get better with medicine. You vomit every time you eat or drink. You have pain that does not get better with medicine. You cannot urinate or have bloody urine. You develop a skin rash. You have a fever. Get help right away if: You have trouble breathing. You have chest pain. You vomit blood. These symptoms may be an emergency. Get help right away. Call 911. Do not wait to see if the symptoms will go away. Do not drive yourself to the hospital. Summary After the procedure, it is common to  have a sore throat, hoarseness, nausea, vomiting, or to feel weak, sleepy, or fatigue. For the time period you were told by your health care provider, do not drive or use machinery. Get help right away if you have difficulty breathing, have chest pain, or vomit blood. These symptoms may be an emergency. This information is not intended to replace advice given to you by your health care provider. Make sure you discuss any questions you have with your health care provider. Document Revised: 09/07/2021 Document Reviewed: 09/07/2021 Elsevier Patient Education  2024 Else[employer] Inc.How to Use Chlorhexidine  at Home in the Shower Chlorhexidine  gluconate (CHG) is a germ-killing (antiseptic) wash that's used to clean the skin. It can get rid of the germs that normally live on the skin and can keep them away for about 24 hours. If you're having surgery, you may be told to shower with CHG at home the night before surgery. This can help lower your risk for infection. To use CHG wash in the shower, follow the steps below. Supplies needed: CHG body wash. Clean washcloth. Clean towel. How to use CHG in the shower Follow these steps unless you're told to use CHG in a different way: Start the shower. Use your normal soap and shampoo to wash your face and hair. Turn off the shower or move out of the shower stream. Pour CHG onto a clean washcloth. Do not use any type of brush or rough sponge. Start at your neck, washing your body down to your toes. Make sure you: Wash the part of your body where the surgery will be done for at least 1 minute. Do not scrub. Do not use CHG on your head or face unless your health care provider tells you to. If it gets into your ears or eyes, rinse them well with water . Do not wash your genitals with CHG. Wash your back and under your arms. Make sure to wash skin folds. Let the CHG sit on your skin for 1-2 minutes or as long as told. Rinse your entire body in the shower, including  all body creases and folds. Turn off the shower. Dry off with a clean towel. Do not put anything on your skin afterward, such as powder, lotion,  or perfume. Put on clean clothes or pajamas. If it's the night before surgery, sleep in clean sheets. General tips Use CHG only as told, and follow the instructions on the label. Use the full amount of CHG as told. This is often one bottle. Do not smoke and stay away from flames after using CHG. Your skin may feel sticky after using CHG. This is normal. The sticky feeling will go away as the CHG dries. Do not use CHG: If you have a chlorhexidine  allergy or have reacted to chlorhexidine  in the past. On open wounds or areas of skin that have broken skin, cuts, or scrapes. On babies younger than 8 months of age. Contact a health care provider if: You have questions about using CHG. Your skin gets irritated or itchy. You have a rash after using CHG. You swallow any CHG. Call your local poison control center [phone] in the [place] Your eyes itch badly, or they become very red or swollen. Your hearing changes. You have trouble seeing. If you can't reach your provider, go to an urgent care or emergency room. Do not drive yourself. Get help right away if: You have swelling or tingling in your mouth or throat. You make high-pitched whistling sounds when you breathe, most often when you breathe out (wheeze). You have trouble breathing. These symptoms may be an emergency. Call 911 right away. Do not wait to see if the symptoms will go away. Do not drive yourself to the hospital. This information is not intended to replace advice given to you by your health care provider. Make sure you discuss any questions you have with your health care provider. Document Revised: 12/24/2022 Document Reviewed: 12/20/2021 Elsevier Patient Education  2024 [employer].

## 2023-10-14 NOTE — Patient Instructions (Signed)
 Hold your Abatacept . Your last dose was 4/18 and we will schedule surgery for 4/29 which will be over the 8 days recommended for holding the medication.

## 2023-10-15 ENCOUNTER — Encounter (HOSPITAL_COMMUNITY): Payer: Self-pay

## 2023-10-15 ENCOUNTER — Encounter (HOSPITAL_COMMUNITY)
Admission: RE | Admit: 2023-10-15 | Discharge: 2023-10-15 | Disposition: A | Discharge status: Home / Self Care | Source: Ambulatory Visit | Attending: General Surgery | Admitting: General Surgery

## 2023-10-15 VITALS — BP 131/82 | HR 74 | Temp 98.2°F | Resp 18 | Ht 68.0 in | Wt 234.0 lb

## 2023-10-15 DIAGNOSIS — R768 Other specified abnormal immunological findings in serum: Secondary | ICD-10-CM | POA: Diagnosis not present

## 2023-10-15 DIAGNOSIS — E119 Type 2 diabetes mellitus without complications: Secondary | ICD-10-CM | POA: Insufficient documentation

## 2023-10-15 DIAGNOSIS — Z01818 Encounter for other preprocedural examination: Secondary | ICD-10-CM | POA: Diagnosis not present

## 2023-10-15 DIAGNOSIS — R5383 Other fatigue: Secondary | ICD-10-CM | POA: Diagnosis not present

## 2023-10-15 DIAGNOSIS — I4510 Unspecified right bundle-branch block: Secondary | ICD-10-CM | POA: Insufficient documentation

## 2023-10-15 DIAGNOSIS — M0609 Rheumatoid arthritis without rheumatoid factor, multiple sites: Secondary | ICD-10-CM | POA: Diagnosis not present

## 2023-10-15 DIAGNOSIS — M79642 Pain in left hand: Secondary | ICD-10-CM | POA: Diagnosis not present

## 2023-10-15 DIAGNOSIS — M79641 Pain in right hand: Secondary | ICD-10-CM | POA: Diagnosis not present

## 2023-10-15 DIAGNOSIS — M1991 Primary osteoarthritis, unspecified site: Secondary | ICD-10-CM | POA: Diagnosis not present

## 2023-10-15 DIAGNOSIS — M542 Cervicalgia: Secondary | ICD-10-CM | POA: Diagnosis not present

## 2023-10-15 DIAGNOSIS — G5603 Carpal tunnel syndrome, bilateral upper limbs: Secondary | ICD-10-CM | POA: Diagnosis not present

## 2023-10-15 LAB — BASIC METABOLIC PANEL WITH GFR
Anion gap: 11 (ref 5–15)
BUN: 14 mg/dL (ref 8–23)
CO2: 27 mmol/L (ref 22–32)
Calcium: 9.2 mg/dL (ref 8.9–10.3)
Chloride: 103 mmol/L (ref 98–111)
Creatinine, Ser: 0.87 mg/dL (ref 0.44–1.00)
GFR, Estimated: >60 mL/min (ref >60)
Glucose, Bld: 104 mg/dL — ABNORMAL HIGH (ref 70–99)
Potassium: 4 mmol/L (ref 3.5–5.1)
Sodium: 141 mmol/L (ref 135–145)

## 2023-10-15 LAB — HEMOGLOBIN A1C
Hgb A1c MFr Bld: 5.3 % (ref 4.8–5.6)
Mean Plasma Glucose: 105.41 mg/dL

## 2023-10-16 NOTE — Progress Notes (Signed)
 [facility] History and Physical  Reason for Referral: Hernia repair    Chief Complaint   Follow-up     Monique Weiss is a 68 y.o. female.  HPI:   Monique Weiss is a 68 yo known to me who has had a ventral hernia for some time now. She last saw me July 2024 and was planning on getting surgery around then[place] the area is getting larger and is more uncomfortable. We had discussed robotic repair at the time last year. She has had no obstructive symptoms.   Past Medical History:  Diagnosis Date   Anxiety    Arthritis    Complication of anesthesia    Hard to wake up   Diabetes mellitus without complication (HCC)    Type II   Hyperlipidemia    Hypertension    Hypothyroidism    PONV (postoperative nausea and vomiting)    Thyroid  disease    Hypo    Past Surgical History:  Procedure Laterality Date   ANTERIOR CERVICAL CORPECTOMY N/A 07/27/2018   Procedure: Cervical five Anterior cervical corpectomy with Cervical six-seven Anterior cervical discectomy and fusion;  Surgeon: Cannon Champion, MD;  Location: [facility] OR;  Service: Neurosurgery;  Laterality: N/A;   ANTERIOR CERVICAL DECOMP/DISCECTOMY FUSION N/A 01/14/2022   Procedure: Cervical Three-Four  Anterior Cervical Discectomy Fusion with Removal of anterior cervical plate;  Surgeon: Cannon Champion, MD;  Location: [facility] OR;  Service: Neurosurgery;  Laterality: N/A;  Cervical Three-Four  Anterior Cervical Discectomy Fusion with Removal of anterior cervical plate   BACK SURGERY      fusions x3   BELPHAROPTOSIS REPAIR     Bilateral   CATARACT EXTRACTION W/PHACO Right 11/09/2021   Procedure: CATARACT EXTRACTION PHACO AND INTRAOCULAR LENS PLACEMENT (IOC);  Surgeon: Tarri Farm, MD;  Location: [facility] ORS;  Service: Ophthalmology;  Laterality: Right;  CDE 26.95   CATARACT EXTRACTION W/PHACO Left 12/03/2021   Procedure: CATARACT EXTRACTION PHACO AND INTRAOCULAR LENS PLACEMENT (IOC);   Surgeon: Tarri Farm, MD;  Location: [facility] ORS;  Service: Ophthalmology;  Laterality: Left;  CDE 23.25   CHOLECYSTECTOMY N/A 01/14/2018   Procedure: LAPAROSCOPIC CHOLECYSTECTOMY;  Surgeon: Awilda Bogus, MD;  Location: [facility] ORS;  Service: General;  Laterality: N/A;   DILATION AND CURETTAGE OF UTERUS     EYE SURGERY     JOINT REPLACEMENT Bilateral    knees   LAPAROTOMY N/A 02/09/2021   Procedure: EXPLORATORY LAPAROTOMY;  Surgeon: Awilda Bogus, MD;  Location: [facility];  Service: General;  Laterality: N/A;   REPLACEMENT TOTAL KNEE     SHOULDER SURGERY Left    bone spur removal    Family History  Problem Relation Age of Onset   Heart disease Mother    Diabetes Mother    Hypertension Mother    Heart failure Mother    Heart attack Father    Prostate cancer Father    Heart disease Sister    Hypertension Sister    Diabetes Sister    Heart attack Brother    Heart disease Brother    Hypertension Brother    Diabetes Brother    Hypertension Son    Diabetes Brother    Diabetes Brother    Hypertension Sister     Social History   Tobacco Use   Smoking status: Never   Smokeless tobacco: Never  Vaping Use   Vaping status: Never Used  Substance Use Topics   Alcohol use:  Never   Drug use: Never    Medications: I have reviewed the patient's current medications. Allergies as of 10/14/2023       Reactions   Xeljanz [tofacitinib] Shortness Of Breath, Nausea Only, Swelling, Other (See Comments)   Headaches   Motrin [ibuprofen] Swelling   Pt takes often   Other Nausea And Vomiting   Pain medication - nausea, vomiting        Medication List        Accurate as of October 14, 2023 11:59 PM. If you have any questions, ask your nurse or doctor.          STOP taking these medications    gabapentin  300 MG capsule Commonly known as: NEURONTIN  Stopped by: Awilda Bogus   Ilevro  0.3 % ophthalmic suspension Generic drug: nepafenac  Stopped by: Arvis Zwahlen C Caera Enwright    prednisoLONE  acetate 1 % ophthalmic suspension Commonly known as: PRED FORTE  Stopped by: Awilda Bogus       TAKE these medications    acetaminophen  500 MG tablet Commonly known as: TYLENOL  Take 1,000 mg by mouth 2 (two) times daily as needed.   atorvastatin  40 MG tablet Commonly known as: LIPITOR Take 40 mg by mouth at bedtime.   docusate sodium  100 MG capsule Commonly known as: COLACE Take 200 mg by mouth 2 (two) times daily.   furosemide  40 MG tablet Commonly known as: LASIX  Take 80 mg by mouth daily.   ibuprofen 200 MG tablet Commonly known as: ADVIL Take 400 mg by mouth 2 (two) times daily as needed.   levothyroxine  88 MCG tablet Commonly known as: SYNTHROID  Take 88 mcg by mouth daily.   linaclotide  145 MCG Caps capsule Commonly known as: LINZESS  Take 145 mcg by mouth daily as needed (constipation).   metFORMIN  1000 MG tablet Commonly known as: GLUCOPHAGE  Take 1,000 mg by mouth 2 (two) times daily with a meal.   Orencia  ClickJect 125 MG/ML Soaj Generic drug: Abatacept  Inject 125 mcg into the skin every Sunday.   promethazine  12.5 MG tablet Commonly known as: PHENERGAN  Take 1 tablet (12.5 mg total) by mouth every 6 (six) hours as needed for nausea or vomiting.   SALONPAS  EX Apply 1 patch topically daily as needed (pain).   sertraline  100 MG tablet Commonly known as: ZOLOFT  Take 100 mg by mouth daily.   spironolactone  25 MG tablet Commonly known as: ALDACTONE  Take 25 mg by mouth every other day.         ROS:  A comprehensive review of systems was negative except for: Gastrointestinal: positive for pain around hernia, worsening in size   Blood pressure 131/82, pulse 74, temperature 98.2 F (36.8 C), temperature source Oral, resp. rate 14, height 5\' 8"  (1.727 m), weight 234 lb (106.1 kg), SpO2 92%. Physical Exam Vitals reviewed.  HENT:     Head: Normocephalic.  Eyes:     Pupils: Pupils are equal, round, and reactive to light.   Abdominal:     General: There is no distension.     Palpations: Abdomen is soft.     Tenderness: There is abdominal tenderness.     Hernia: A hernia is present.     Comments: Ventral hernia, 4+cm defect of fascia, larger mushroom hernia bulge, tender with reduction  Neurological:     Mental Status: She is alert.     Results: None    Assessment and Plan:  Monique Weiss is a 68 y.o. female with ventral hernia that needs repair. She is  having worsening symptoms and pain. We discussed robotic assisted laparoscopic hernia repair versus open repair with mesh. Discussed risk of bleeding, infection, recurrence, injury to other organs, need for open surgery.   She takes Abatacept  and took it last 4/18. She can have surgery 8 days after that was dosed. Plan for surgery next week.  All questions were answered to the satisfaction of the patient.    Awilda Bogus 10/16/2023, 12:46 PM

## 2023-10-17 NOTE — H&P (Signed)
 [facility] History and Physical  Reason for Referral: Hernia repair    Chief Complaint   Follow-up     Monique Weiss is a 68 y.o. female.  HPI:   Monique Weiss is a 68 yo known to me who has had a ventral hernia for some time now. She last saw me July 2024 and was planning on getting surgery around then. [place] as the area is getting larger and is more uncomfortable. We had discussed robotic repair at the time last year. She has had no obstructive symptoms.   Past Medical History:  Diagnosis Date   Anxiety    Arthritis    Complication of anesthesia    Hard to wake up   Diabetes mellitus without complication (HCC)    Type II   Hyperlipidemia    Hypertension    Hypothyroidism    PONV (postoperative nausea and vomiting)    Thyroid  disease    Hypo    Past Surgical History:  Procedure Laterality Date   ANTERIOR CERVICAL CORPECTOMY N/A 07/27/2018   Procedure: Cervical five Anterior cervical corpectomy with Cervical six-seven Anterior cervical discectomy and fusion;  Surgeon: Cannon Champion, MD;  Location: [facility] OR;  Service: Neurosurgery;  Laterality: N/A;   ANTERIOR CERVICAL DECOMP/DISCECTOMY FUSION N/A 01/14/2022   Procedure: Cervical Three-Four  Anterior Cervical Discectomy Fusion with Removal of anterior cervical plate;  Surgeon: Cannon Champion, MD;  Location: [facility] OR;  Service: Neurosurgery;  Laterality: N/A;  Cervical Three-Four  Anterior Cervical Discectomy Fusion with Removal of anterior cervical plate   BACK SURGERY      fusions x3   BELPHAROPTOSIS REPAIR     Bilateral   CATARACT EXTRACTION W/PHACO Right 11/09/2021   Procedure: CATARACT EXTRACTION PHACO AND INTRAOCULAR LENS PLACEMENT (IOC);  Surgeon: Tarri Farm, MD;  Location: [facility] ORS;  Service: Ophthalmology;  Laterality: Right;  CDE 26.95   CATARACT EXTRACTION W/PHACO Left 12/03/2021   Procedure: CATARACT EXTRACTION PHACO AND INTRAOCULAR LENS PLACEMENT (IOC);   Surgeon: Tarri Farm, MD;  Location: [facility]S;  Service: Ophthalmology;  Laterality: Left;  CDE 23.25   CHOLECYSTECTOMY N/A 01/14/2018   Procedure: LAPAROSCOPIC CHOLECYSTECTOMY;  Surgeon: Awilda Bogus, MD;  Location: [facility];  Service: General;  Laterality: N/A;   DILATION AND CURETTAGE OF UTERUS     EYE SURGERY     JOINT REPLACEMENT Bilateral    knees   LAPAROTOMY N/A 02/09/2021   Procedure: EXPLORATORY LAPAROTOMY;  Surgeon: Awilda Bogus, MD;  Location: [facility];  Service: General;  Laterality: N/A;   REPLACEMENT TOTAL KNEE     SHOULDER SURGERY Left    bone spur removal    Family History  Problem Relation Age of Onset   Heart disease Mother    Diabetes Mother    Hypertension Mother    Heart failure Mother    Heart attack Father    Prostate cancer Father    Heart disease Sister    Hypertension Sister    Diabetes Sister    Heart attack Brother    Heart disease Brother    Hypertension Brother    Diabetes Brother    Hypertension Son    Diabetes Brother    Diabetes Brother    Hypertension Sister     Social History   Tobacco Use   Smoking status: Never   Smokeless tobacco: Never  Vaping Use   Vaping status: Never Used  Substance Use Topics   Alcohol use:  Never   Drug use: Never    Medications: I have reviewed the patient's current medications. Allergies as of 10/14/2023       Reactions   Xeljanz [tofacitinib] Shortness Of Breath, Nausea Only, Swelling, Other (See Comments)   Headaches   Motrin [ibuprofen] Swelling   Pt takes often   Other Nausea And Vomiting   Pain medication - nausea, vomiting        Medication List        Accurate as of October 14, 2023 11:59 PM. If you have any questions, ask your nurse or doctor.          STOP taking these medications    gabapentin  300 MG capsule Commonly known as: NEURONTIN  Stopped by: Awilda Bogus   Ilevro  0.3 % ophthalmic suspension Generic drug: nepafenac  Stopped by: Arvis Zwahlen C Caera Enwright    prednisoLONE  acetate 1 % ophthalmic suspension Commonly known as: PRED FORTE  Stopped by: Awilda Bogus       TAKE these medications    acetaminophen  500 MG tablet Commonly known as: TYLENOL  Take 1,000 mg by mouth 2 (two) times daily as needed.   atorvastatin  40 MG tablet Commonly known as: LIPITOR Take 40 mg by mouth at bedtime.   docusate sodium  100 MG capsule Commonly known as: COLACE Take 200 mg by mouth 2 (two) times daily.   furosemide  40 MG tablet Commonly known as: LASIX  Take 80 mg by mouth daily.   ibuprofen 200 MG tablet Commonly known as: ADVIL Take 400 mg by mouth 2 (two) times daily as needed.   levothyroxine  88 MCG tablet Commonly known as: SYNTHROID  Take 88 mcg by mouth daily.   linaclotide  145 MCG Caps capsule Commonly known as: LINZESS  Take 145 mcg by mouth daily as needed (constipation).   metFORMIN  1000 MG tablet Commonly known as: GLUCOPHAGE  Take 1,000 mg by mouth 2 (two) times daily with a meal.   Orencia  ClickJect 125 MG/ML Soaj Generic drug: Abatacept  Inject 125 mcg into the skin every Sunday.   promethazine  12.5 MG tablet Commonly known as: PHENERGAN  Take 1 tablet (12.5 mg total) by mouth every 6 (six) hours as needed for nausea or vomiting.   SALONPAS  EX Apply 1 patch topically daily as needed (pain).   sertraline  100 MG tablet Commonly known as: ZOLOFT  Take 100 mg by mouth daily.   spironolactone  25 MG tablet Commonly known as: ALDACTONE  Take 25 mg by mouth every other day.         ROS:  A comprehensive review of systems was negative except for: Gastrointestinal: positive for pain around hernia, worsening in size   Blood pressure 131/82, pulse 74, temperature 98.2 F (36.8 C), temperature source Oral, resp. rate 14, height 5\' 8"  (1.727 m), weight 234 lb (106.1 kg), SpO2 92%. Physical Exam Vitals reviewed.  HENT:     Head: Normocephalic.  Eyes:     Pupils: Pupils are equal, round, and reactive to light.   Abdominal:     General: There is no distension.     Palpations: Abdomen is soft.     Tenderness: There is abdominal tenderness.     Hernia: A hernia is present.     Comments: Ventral hernia, 4+cm defect of fascia, larger mushroom hernia bulge, tender with reduction  Neurological:     Mental Status: She is alert.     Results: None    Assessment and Plan:  Monique Weiss is a 68 y.o. female with ventral hernia that needs repair. She is  having worsening symptoms and pain. We discussed robotic assisted laparoscopic hernia repair versus open repair with mesh. Discussed risk of bleeding, infection, recurrence, injury to other organs, need for open surgery.   She takes Abatacept  and took it last 4/18. She can have surgery 8 days after that was dosed. Plan for surgery next week.  All questions were answered to the satisfaction of the patient.    Awilda Bogus 10/16/2023, 12:46 PM

## 2023-10-21 ENCOUNTER — Ambulatory Visit (HOSPITAL_BASED_OUTPATIENT_CLINIC_OR_DEPARTMENT_OTHER): Admitting: Anesthesiology

## 2023-10-21 ENCOUNTER — Encounter (HOSPITAL_COMMUNITY): Payer: Self-pay | Admitting: General Surgery

## 2023-10-21 ENCOUNTER — Ambulatory Visit (HOSPITAL_COMMUNITY): Admitting: Anesthesiology

## 2023-10-21 ENCOUNTER — Encounter (HOSPITAL_COMMUNITY)
Admission: RE | Disposition: A | Discharge status: Home / Self Care | Payer: Self-pay | Source: Home / Self Care | Attending: General Surgery

## 2023-10-21 ENCOUNTER — Ambulatory Visit (HOSPITAL_COMMUNITY)
Admission: RE | Admit: 2023-10-21 | Discharge: 2023-10-21 | Disposition: A | Discharge status: Home / Self Care | Attending: General Surgery | Admitting: General Surgery

## 2023-10-21 DIAGNOSIS — G4730 Sleep apnea, unspecified: Secondary | ICD-10-CM | POA: Insufficient documentation

## 2023-10-21 DIAGNOSIS — I10 Essential (primary) hypertension: Secondary | ICD-10-CM

## 2023-10-21 DIAGNOSIS — Z79899 Other long term (current) drug therapy: Secondary | ICD-10-CM | POA: Diagnosis not present

## 2023-10-21 DIAGNOSIS — Z79890 Hormone replacement therapy (postmenopausal): Secondary | ICD-10-CM | POA: Insufficient documentation

## 2023-10-21 DIAGNOSIS — E039 Hypothyroidism, unspecified: Secondary | ICD-10-CM | POA: Diagnosis not present

## 2023-10-21 DIAGNOSIS — F419 Anxiety disorder, unspecified: Secondary | ICD-10-CM | POA: Diagnosis not present

## 2023-10-21 DIAGNOSIS — K439 Ventral hernia without obstruction or gangrene: Secondary | ICD-10-CM

## 2023-10-21 DIAGNOSIS — E119 Type 2 diabetes mellitus without complications: Secondary | ICD-10-CM | POA: Diagnosis not present

## 2023-10-21 DIAGNOSIS — K219 Gastro-esophageal reflux disease without esophagitis: Secondary | ICD-10-CM | POA: Diagnosis not present

## 2023-10-21 DIAGNOSIS — Z7984 Long term (current) use of oral hypoglycemic drugs: Secondary | ICD-10-CM | POA: Insufficient documentation

## 2023-10-21 DIAGNOSIS — M1990 Unspecified osteoarthritis, unspecified site: Secondary | ICD-10-CM | POA: Diagnosis not present

## 2023-10-21 HISTORY — PX: ROBOTIC ASSISTED LAPAROSCOPIC LYSIS OF ADHESION: SHX6080

## 2023-10-21 HISTORY — PX: XI ROBOTIC ASSISTED VENTRAL HERNIA: SHX6789

## 2023-10-21 LAB — GLUCOSE, CAPILLARY
Glucose-Capillary: 100 mg/dL — ABNORMAL HIGH (ref 70–99)
Glucose-Capillary: 145 mg/dL — ABNORMAL HIGH (ref 70–99)

## 2023-10-21 SURGERY — REPAIR, HERNIA, VENTRAL, ROBOT-ASSISTED
Anesthesia: General | Site: Abdomen

## 2023-10-21 MED ORDER — SCOPOLAMINE 1 MG/3DAYS TD PT72
MEDICATED_PATCH | TRANSDERMAL | Status: AC
  Filled 2023-10-21: qty 1

## 2023-10-21 MED ORDER — HYDROMORPHONE HCL 1 MG/ML IJ SOLN
0.2500 mg | INTRAMUSCULAR | Status: DC | PRN
Start: 2023-10-21 — End: 2023-10-21
  Administered 2023-10-21 (×2): 0.5 mg via INTRAVENOUS
  Filled 2023-10-21 (×2): qty 0.5

## 2023-10-21 MED ORDER — FENTANYL CITRATE (PF) 250 MCG/5ML IJ SOLN
INTRAMUSCULAR | Status: DC | PRN
Start: 2023-10-21 — End: 2023-10-21
  Administered 2023-10-21 (×3): 50 ug via INTRAVENOUS

## 2023-10-21 MED ORDER — LACTATED RINGERS IV SOLN: INTRAVENOUS | Status: DC

## 2023-10-21 MED ORDER — EPHEDRINE SULFATE-NACL 50-0.9 MG/10ML-% IV SOSY
PREFILLED_SYRINGE | INTRAVENOUS | Status: DC | PRN
  Administered 2023-10-21: 5 mg via INTRAVENOUS

## 2023-10-21 MED ORDER — ACETAMINOPHEN 500 MG PO TABS
ORAL_TABLET | ORAL | Status: AC
  Filled 2023-10-21: qty 2

## 2023-10-21 MED ORDER — MIDAZOLAM HCL 2 MG/2ML IJ SOLN
INTRAMUSCULAR | Status: AC
  Filled 2023-10-21: qty 2

## 2023-10-21 MED ORDER — MIDAZOLAM HCL 2 MG/2ML IJ SOLN
INTRAMUSCULAR | Status: DC | PRN
  Administered 2023-10-21: 2 mg via INTRAVENOUS

## 2023-10-21 MED ORDER — ROCURONIUM BROMIDE 10 MG/ML (PF) SYRINGE
PREFILLED_SYRINGE | INTRAVENOUS | Status: DC | PRN
Start: 2023-10-21 — End: 2023-10-21
  Administered 2023-10-21: 50 mg via INTRAVENOUS
  Administered 2023-10-21 (×2): 20 mg via INTRAVENOUS

## 2023-10-21 MED ORDER — PROPOFOL 10 MG/ML IV BOLUS
INTRAVENOUS | Status: AC
  Filled 2023-10-21: qty 20

## 2023-10-21 MED ORDER — DEXMEDETOMIDINE HCL IN NACL 80 MCG/20ML IV SOLN
INTRAVENOUS | Status: AC
Start: 2023-10-21 — End: ?
  Filled 2023-10-21: qty 20

## 2023-10-21 MED ORDER — CHLORHEXIDINE GLUCONATE CLOTH 2 % EX PADS
6.0000 | MEDICATED_PAD | Freq: Once | CUTANEOUS | Status: DC
  Administered 2023-10-21: 6 via TOPICAL

## 2023-10-21 MED ORDER — PHENYLEPHRINE 80 MCG/ML (10ML) SYRINGE FOR IV PUSH (FOR BLOOD PRESSURE SUPPORT)
PREFILLED_SYRINGE | INTRAVENOUS | Status: DC | PRN
  Administered 2023-10-21 (×2): 80 ug via INTRAVENOUS

## 2023-10-21 MED ORDER — SCOPOLAMINE 1 MG/3DAYS TD PT72
1.0000 | MEDICATED_PATCH | Freq: Once | TRANSDERMAL | Status: DC
  Administered 2023-10-21: 1.5 mg via TRANSDERMAL

## 2023-10-21 MED ORDER — SODIUM CHLORIDE 0.9 % IV SOLN: 12.5000 mg | INTRAVENOUS | Status: DC | PRN

## 2023-10-21 MED ORDER — DEXAMETHASONE SODIUM PHOSPHATE 10 MG/ML IJ SOLN
INTRAMUSCULAR | Status: DC | PRN
  Administered 2023-10-21: 5 mg via INTRAVENOUS

## 2023-10-21 MED ORDER — ALBUTEROL SULFATE (2.5 MG/3ML) 0.083% IN NEBU
2.5000 mg | INHALATION_SOLUTION | Freq: Once | RESPIRATORY_TRACT | Status: AC
  Administered 2023-10-21: 2.5 mg via RESPIRATORY_TRACT

## 2023-10-21 MED ORDER — OXYCODONE HCL 5 MG/5ML PO SOLN: 5.0000 mg | Freq: Once | ORAL | Status: AC | PRN

## 2023-10-21 MED ORDER — PHENYLEPHRINE 80 MCG/ML (10ML) SYRINGE FOR IV PUSH (FOR BLOOD PRESSURE SUPPORT)
PREFILLED_SYRINGE | INTRAVENOUS | Status: AC
  Filled 2023-10-21: qty 10

## 2023-10-21 MED ORDER — EPHEDRINE 5 MG/ML INJ
INTRAVENOUS | Status: AC
  Filled 2023-10-21: qty 5

## 2023-10-21 MED ORDER — DEXMEDETOMIDINE HCL IN NACL 80 MCG/20ML IV SOLN
INTRAVENOUS | Status: DC | PRN
  Administered 2023-10-21: 8 ug via INTRAVENOUS

## 2023-10-21 MED ORDER — ONDANSETRON HCL 4 MG/2ML IJ SOLN
INTRAMUSCULAR | Status: AC
  Filled 2023-10-21: qty 2

## 2023-10-21 MED ORDER — FENTANYL CITRATE (PF) 250 MCG/5ML IJ SOLN
INTRAMUSCULAR | Status: AC
  Filled 2023-10-21: qty 5

## 2023-10-21 MED ORDER — BUPIVACAINE HCL (PF) 0.5 % IJ SOLN
INTRAMUSCULAR | Status: DC | PRN
  Administered 2023-10-21: 30 mL

## 2023-10-21 MED ORDER — KETOROLAC TROMETHAMINE 30 MG/ML IJ SOLN
INTRAMUSCULAR | Status: AC
  Filled 2023-10-21: qty 1

## 2023-10-21 MED ORDER — CEFAZOLIN SODIUM-DEXTROSE 2-4 GM/100ML-% IV SOLN
INTRAVENOUS | Status: AC
  Filled 2023-10-21: qty 100

## 2023-10-21 MED ORDER — ACETAMINOPHEN 500 MG PO TABS
1000.0000 mg | ORAL_TABLET | Freq: Once | ORAL | Status: AC
  Administered 2023-10-21: 1000 mg via ORAL

## 2023-10-21 MED ORDER — ACETAMINOPHEN 160 MG/5ML PO SOLN
960.0000 mg | Freq: Once | ORAL | Status: AC
  Filled 2023-10-21: qty 30

## 2023-10-21 MED ORDER — OXYCODONE HCL 5 MG PO TABS
5.0000 mg | ORAL_TABLET | Freq: Once | ORAL | Status: AC | PRN
  Administered 2023-10-21: 5 mg via ORAL
  Filled 2023-10-21: qty 1

## 2023-10-21 MED ORDER — SUGAMMADEX SODIUM 200 MG/2ML IV SOLN
INTRAVENOUS | Status: DC | PRN
  Administered 2023-10-21: 300 mg via INTRAVENOUS

## 2023-10-21 MED ORDER — ALBUTEROL SULFATE (2.5 MG/3ML) 0.083% IN NEBU
INHALATION_SOLUTION | RESPIRATORY_TRACT | Status: AC
  Filled 2023-10-21: qty 3

## 2023-10-21 MED ORDER — ONDANSETRON HCL 4 MG PO TABS: 4.0000 mg | ORAL_TABLET | Freq: Three times a day (TID) | ORAL | 1 refills | Status: DC | PRN

## 2023-10-21 MED ORDER — STERILE WATER FOR IRRIGATION IR SOLN
Status: DC | PRN
  Administered 2023-10-21: 500 mL

## 2023-10-21 MED ORDER — ONDANSETRON HCL 4 MG/2ML IJ SOLN
INTRAMUSCULAR | Status: DC | PRN
  Administered 2023-10-21: 4 mg via INTRAVENOUS

## 2023-10-21 MED ORDER — ROCURONIUM BROMIDE 10 MG/ML (PF) SYRINGE
PREFILLED_SYRINGE | INTRAVENOUS | Status: AC
  Filled 2023-10-21: qty 10

## 2023-10-21 MED ORDER — LIDOCAINE 2% (20 MG/ML) 5 ML SYRINGE
INTRAMUSCULAR | Status: AC
  Filled 2023-10-21: qty 5

## 2023-10-21 MED ORDER — CEFAZOLIN SODIUM-DEXTROSE 2-4 GM/100ML-% IV SOLN
2.0000 g | INTRAVENOUS | Status: AC
  Administered 2023-10-21: 2 g via INTRAVENOUS

## 2023-10-21 MED ORDER — PROPOFOL 10 MG/ML IV BOLUS
INTRAVENOUS | Status: DC | PRN
  Administered 2023-10-21: 50 mg via INTRAVENOUS
  Administered 2023-10-21: 150 mg via INTRAVENOUS

## 2023-10-21 MED ORDER — BUPIVACAINE HCL (PF) 0.5 % IJ SOLN
INTRAMUSCULAR | Status: AC
  Filled 2023-10-21: qty 30

## 2023-10-21 MED ORDER — LIDOCAINE 2% (20 MG/ML) 5 ML SYRINGE
INTRAMUSCULAR | Status: DC | PRN
Start: 2023-10-21 — End: 2023-10-21
  Administered 2023-10-21: 100 mg via INTRAVENOUS

## 2023-10-21 MED ORDER — ORAL CARE MOUTH RINSE: 15.0000 mL | Freq: Once | OROMUCOSAL | Status: AC

## 2023-10-21 MED ORDER — CHLORHEXIDINE GLUCONATE 0.12 % MT SOLN
15.0000 mL | Freq: Once | OROMUCOSAL | Status: AC
  Administered 2023-10-21: 15 mL via OROMUCOSAL

## 2023-10-21 MED ORDER — PHENYLEPHRINE HCL-NACL 20-0.9 MG/250ML-% IV SOLN
INTRAVENOUS | Status: AC
  Filled 2023-10-21: qty 250

## 2023-10-21 MED ORDER — OXYCODONE HCL 5 MG PO TABS: 5.0000 mg | ORAL_TABLET | ORAL | 0 refills | Status: AC | PRN

## 2023-10-21 SURGICAL SUPPLY — 37 items
BLADE SURG 15 STRL LF DISP TIS (BLADE) ×2 IMPLANT
CHLORAPREP W/TINT 26 (MISCELLANEOUS) ×2 IMPLANT
COVER LIGHT HANDLE STERIS (MISCELLANEOUS) ×4 IMPLANT
COVER MAYO STAND XLG (MISCELLANEOUS) ×4 IMPLANT
COVER TIP SHEARS 8 DVNC (MISCELLANEOUS) ×2 IMPLANT
DERMABOND ADVANCED .7 DNX12 (GAUZE/BANDAGES/DRESSINGS) ×2 IMPLANT
DRAPE ARM DVNC X/XI (DISPOSABLE) ×6 IMPLANT
DRAPE COLUMN DVNC XI (DISPOSABLE) ×2 IMPLANT
DRIVER NDL MEGA SUTCUT DVNCXI (INSTRUMENTS) ×2 IMPLANT
DRIVER NDLE MEGA SUTCUT DVNCXI (INSTRUMENTS) ×2 IMPLANT
ELECTRODE REM PT RTRN 9FT ADLT (ELECTROSURGICAL) ×2 IMPLANT
FORCEPS BPLR R/ABLATION 8 DVNC (INSTRUMENTS) ×2 IMPLANT
GLOVE BIO SURGEON STRL SZ 6.5 (GLOVE) ×4 IMPLANT
GLOVE BIOGEL PI IND STRL 6.5 (GLOVE) ×4 IMPLANT
GLOVE BIOGEL PI IND STRL 7.0 (GLOVE) ×8 IMPLANT
GOWN STRL REUS W/TWL LRG LVL3 (GOWN DISPOSABLE) ×6 IMPLANT
KIT TURNOVER KIT A (KITS) ×2 IMPLANT
MESH VENTRALIGHT ST 4X6IN (Mesh General) IMPLANT
NDL HYPO 21X1.5 SAFETY (NEEDLE) ×2 IMPLANT
NDL INSUFFLATION 14GA 120MM (NEEDLE) ×2 IMPLANT
NEEDLE HYPO 21X1.5 SAFETY (NEEDLE) ×2 IMPLANT
NEEDLE INSUFFLATION 14GA 120MM (NEEDLE) ×2 IMPLANT
OBTURATOR OPTICALSTD 8 DVNC (TROCAR) ×2 IMPLANT
PACK LAP CHOLE LZT030E (CUSTOM PROCEDURE TRAY) ×2 IMPLANT
PENCIL HANDSWITCHING (ELECTRODE) ×2 IMPLANT
POSITIONER HEAD 8X9X4 ADT (SOFTGOODS) ×2 IMPLANT
SCISSORS MNPLR CVD DVNC XI (INSTRUMENTS) ×2 IMPLANT
SEAL UNIV 5-12 XI (MISCELLANEOUS) ×6 IMPLANT
SET BASIN LINEN APH (SET/KITS/TRAYS/PACK) ×2 IMPLANT
SET TUBE SMOKE EVAC HIGH FLOW (TUBING) ×2 IMPLANT
SUT MNCRL AB 4-0 PS2 18 (SUTURE) ×2 IMPLANT
SUT STRATA 3-0 SH (SUTURE) ×8 IMPLANT
SUTURE STRATFX 0 PDS+ CT-2 23 (SUTURE) ×2 IMPLANT
SYR 30ML LL (SYRINGE) ×2 IMPLANT
TAPE TRANSPORE STRL 2 31045 (GAUZE/BANDAGES/DRESSINGS) ×4 IMPLANT
TRAY FOLEY W/BAG SLVR 16FR ST (SET/KITS/TRAYS/PACK) IMPLANT
WATER STERILE IRR 500ML POUR (IV SOLUTION) ×2 IMPLANT

## 2023-10-21 NOTE — Op Note (Signed)
 [facility] Operative Note  10/21/23  Preoperative Diagnosis: Ventral hernia    Postoperative Diagnosis: Same   Procedure(s) Performed: Robotic assisted laparoscopic ventral hernia with mesh, defect size 6cm    Surgeon: Dixon Fredrickson. Collene Dawson, MD   Assistants: No qualified resident was available    Anesthesia: General endotracheal   Anesthesiologist: Dr. Fredric Jean, MD    Specimens:  None    Estimated Blood Loss: Minimal   Blood Replacement: None    Complications: None   Wound Class: Clean   Operative Indications: Monique Weiss is a 68 yo with a ventral hernia that is getting larger. We discussed repair with mesh and robotic assisted laparoscopic technique versus open. Discussed risk of bleeding, infection, injury to bowel, recurrence   Findings: 6 defect Tension free repair achieved with Ventralight ST 15X10cm mesh and suture Adequate hemostasis    Procedure: The patient was brought to the operating room and general anesthesia was induced. A time-out was completed verifying correct patient, procedure, site, positioning, and implant(s) and/or special equipment prior to beginning this procedure. Antibiotics were administered prior to making the incision. SCDs placed. The anterior abdominal wall was prepped and draped in the standard sterile fashion.   An stab incision was made at Palmer's point, and a Veress needle placed and the saline drop test was performed. The left side of the abdomen was slightly insufflated despite the saline dropping, indicating superficial placement. This was removed to an a stab incision was made in the epigastric region. Veress needle placed and the saline drop test was performed.  The insufflation was performed up to 15 mm Hg.  I entered in the left superior lateral position, more lateral than my initial stab at Palmer's point after she was insufflated. I entered under direct visualization, and noted no injury inferior to this or the Veress  sites.  I opted to place the second port at the epigastric region where the stab incision was located, and this was more than 8cm from the left superior lateral port. A third port was placed under direct visualization in the left lateral mid region.  There was omentum adherent to the abdominal wall and into the inferior hernia sac.  The [place] robot then docked into place.  Scissors were placed in the epigastric port and a forced bipolar in the left mid port.  The omentum was taken down to expose the fascia and the edges of the hernia sac.  The hernia contents noted and reduced with combination of blunt, sharp dissection with scissors and bipolar forceps.  Hemostasis achieved throughout this portion.  Once all hernia contents reduced, there was noted to be a 6cm but slightly more oblong hernia defect.  Any fat overlying the peritoneal layer was taken down creating flaps to clear the space for adequate fixation of the mesh.The Ventralight ST 15X10cm mesh and 3-0 Stratafix Monofilament X 3 and 0 Stratafix suture were placed in the abdominal cavity under direct visualization.T   A transfacial suture with 0 stratafix used to primarily close defect under minimal tension.  A second 0 Stratafix was used after I closed the inferior portion first and with the incorporation of the hernia sac, did not have sufficient length to close the entire defect without introducing more tension. The 2nd Stratafix was then passed through the center of the mesh (coated side out) to secured the mesh to the abdominal wall centered over the defect.  The mesh was then circumferentially sutured into the anterior abdominal wall using 3-0 Stratafix Monofilament  X3.  Any bleeding noted during this portion was no longer actively bleeding by end of securing mesh and tightening the suture.  All needles and suture were removed under direct visualization. A small pad of amputated fat was removed in a endocatch bag.    The robot was undocked.  The  abdomen was then de-sufflated and the ports were removed.  Marcaine  was injected. All skin incisions closed with subcuticular 4-0 Monocryl and dermabond. Final inspection revealed acceptable hemostasis.   All counts were correct at the end of the case. The patient was awakened from anesthesia and extubated without complication.  The patient went to the PACU in stable condition.   Monique Farrier, MD [facility] [address] [place], [place] [address] [phone] (office)

## 2023-10-21 NOTE — Anesthesia Postprocedure Evaluation (Signed)
 Anesthesia Post Note  Patient: Monique Weiss  Procedure(s) Performed: ROBOT-ASSISTED VENTRAL HERNIA REPAIR, WITH MESH (Abdomen) LYSIS, ADHESIONS, ROBOT-ASSISTED, LAPAROSCOPIC (Abdomen)  Patient location during evaluation: PACU Anesthesia Type: General Level of consciousness: awake and alert Pain management: pain level controlled Vital Signs Assessment: post-procedure vital signs reviewed and stable Respiratory status: spontaneous breathing, nonlabored ventilation, respiratory function stable and patient connected to nasal cannula oxygen Cardiovascular status: blood pressure returned to baseline and stable Postop Assessment: no apparent nausea or vomiting Anesthetic complications: no   There were no known notable events for this encounter.   Last Vitals:  Vitals:   10/21/23 1245 10/21/23 1250  BP: 118/73   Pulse: 71 72  Resp: 11 12  Temp:    SpO2: 92% 93%    Last Pain:  Vitals:   10/21/23 1245  PainSc: 2                  Makinna Andy L Beauty Pless

## 2023-10-21 NOTE — Discharge Instructions (Addendum)
 Discharge Robotic Assisted Laparoscopic Surgery Instructions:  When you go home to the beach, stop every hour and walk around to prevent blood clots and pain issues with sitting.   You can restart your Abatacept  typically 2 weeks after surgery. If you have any issues please call us  before restarting.   Common Complaints: Right shoulder pain is common after laparoscopic surgery.  This is secondary to the gas used in the surgery being trapped under the diaphragm.  Walk to help your body absorb the gas. This will improve in a few days. Pain at the port sites are common, especially the larger port sites. This will improve with time.  Some nausea is common and poor appetite. The main goal is to stay hydrated the first few days after surgery.   Diet/ Activity: Diet as tolerated. You may not have an appetite, but it is important to stay hydrated.  Drink 64 ounces of water  a day. Your appetite will return with time.  Shower per your regular routine daily.  Do not take hot showers. Take warm showers that are less than 10 minutes. Rest and listen to your body, but do not remain in bed all day.  Walk everyday for at least 15-20 minutes. Deep cough and move around every 1-2 hours in the first few days after surgery.  Do not lift > 10 lbs, perform excessive bending, pushing, pulling, squatting for 4-6 weeks after surgery.  Do not pick at the dermabond glue on your incision sites.  This glue film will remain in place for 1-2 weeks and will start to peel off.  Do not place lotions or balms on your incision unless instructed to specifically by Dr. Collene Dawson.   Pain Expectations and Narcotics: -After surgery you will have pain associated with your incisions and this is normal. The pain is muscular and nerve pain, and will get better with time. -You are encouraged and expected to take non narcotic medications like tylenol  and ibuprofen (when able) to treat pain as multiple modalities can aid with pain  treatment. -Narcotics are only used when pain is severe or there is breakthrough pain. -You are not expected to have a pain score of 0 after surgery, as we cannot prevent pain. A pain score of 3-4 that allows you to be functional, move, walk, and tolerate some activity is the goal. The pain will continue to improve over the days after surgery and is dependent on your surgery. -Due to [place] law, we are only able to give a certain amount of pain medication to treat post operative pain, and we only give additional narcotics on a patient by patient basis.  -For most laparoscopic surgery, studies have shown that the majority of patients only need 10-15 narcotic pills, and for open surgeries most patients only need 15-20.   -Having appropriate expectations of pain and knowledge of pain management with non narcotics is important as we do not want anyone to become addicted to narcotic pain medication.  -Using ice packs in the first 48 hours and heating pads after 48 hours, wearing an abdominal binder (when recommended), and using over the counter medications are all ways to help with pain management.   -Simple acts like meditation and mindfulness practices after surgery can also help with pain control and research has proven the benefit of these practices.  Medication: Take tylenol  and ibuprofen as needed for pain control, alternating every 4-6 hours.  Example:  Tylenol  1000mg  @ 6am, 12noon, 6pm, (Do not exceed 4000mg  of  tylenol  a day). Ibuprofen 800mg  @ 9am, 3pm, 9pm, 3am (Do not exceed 3600mg  of ibuprofen a day).  Take Roxicodone  for breakthrough pain every 4 hours.  Take Colace for constipation related to narcotic pain medication. If you do not have a bowel movement in 2 days, take Miralax  over the counter.  Drink plenty of water  to also prevent constipation.   Contact Information: If you have questions or concerns, please call our office, [phone], Monday- Thursday 8AM-5PM and Friday  8AM-12Noon.  If it is after hours or on the weekend, please call [facility], [phone], [phone], and ask to speak to the surgeon on call for Dr. Collene Dawson at [facility].      Post Anesthesia Home Care Instructions  Activity: Get plenty of rest for the remainder of the day. A responsible individual must stay with you for 24 hours following the procedure.  For the next 24 hours, DO NOT: -Drive a car -Advertising copywriter -Drink alcoholic beverages -Take any medication unless instructed by your physician -Make any legal decisions or sign important papers.  Meals: Start with liquid foods such as gelatin or soup. Progress to regular foods as tolerated. Avoid greasy, spicy, heavy foods. If nausea and/or vomiting occur, drink only clear liquids until the nausea and/or vomiting subsides. Call your physician if vomiting continues.  Special Instructions/Symptoms: Your throat may feel dry or sore from the anesthesia or the breathing tube placed in your throat during surgery. If this causes discomfort, gargle with warm salt water . The discomfort should disappear within 24 hours.  If you had a scopolamine  patch placed behind your ear for the management of post- operative nausea and/or vomiting:  1. The medication in the patch is effective for 72 hours, after which it should be removed.  Wrap patch in a tissue and discard in the trash. Wash hands thoroughly with soap and water . 2. You may remove the patch earlier than 72 hours if you experience unpleasant side effects which may include dry mouth, dizziness or visual disturbances. 3. Avoid touching the patch. Wash your hands with soap and water  after contact with the patch.

## 2023-10-21 NOTE — Progress Notes (Addendum)
 [facility]  Updated her granddaughter. Rx to Pharmacy. When she goes back to the beach she will need to stop every hour to walk. No driving if taking narcotics.   Plan to see me in 1 month. Binder Abatacept  can usually be restarted in 2 weeks after surgery if no signs of healing issues or infection, call with questions or concerns.   Deena Farrier, MD [facility] [address] [place], [place] [address] [phone] (office)

## 2023-10-21 NOTE — Anesthesia Procedure Notes (Signed)
 Procedure Name: Intubation Date/Time: 10/21/2023 9:41 AM  Performed by: Sherwin Donate, CRNAPre-anesthesia Checklist: Patient identified, Emergency Drugs available, Suction available and Patient being monitored Patient Re-evaluated:Patient Re-evaluated prior to induction Oxygen Delivery Method: Circle system utilized Preoxygenation: Pre-oxygenation with 100% oxygen Induction Type: IV induction Ventilation: Mask ventilation without difficulty and Oral airway inserted - appropriate to patient size Laryngoscope Size: Glidescope and 3 Grade View: Grade I Tube type: Oral Tube size: 7.0 mm Number of attempts: 1 Airway Equipment and Method: Oral airway, Video-laryngoscopy and Rigid stylet Placement Confirmation: ETT inserted through vocal cords under direct vision, positive ETCO2 and breath sounds checked- equal and bilateral Secured at: 22 cm Tube secured with: Tape Dental Injury: Teeth and Oropharynx as per pre-operative assessment  Comments: Glidescope electively used due to limited ROM of neck, H/O cervical neck fusion

## 2023-10-21 NOTE — Progress Notes (Signed)
 Patient sleeping will arouse when spoken to.

## 2023-10-21 NOTE — Transfer of Care (Signed)
 Immediate Anesthesia Transfer of Care Note  Patient: Alaine Alken  Procedure(s) Performed: ROBOT-ASSISTED VENTRAL HERNIA REPAIR, WITH MESH (Abdomen) LYSIS, ADHESIONS, ROBOT-ASSISTED, LAPAROSCOPIC (Abdomen)  Patient Location: PACU  Anesthesia Type:General  Level of Consciousness: awake  Airway & Oxygen Therapy: Patient Spontanous Breathing and Patient connected to face mask oxygen  Post-op Assessment: Report given to RN and Post -op Vital signs reviewed and stable  Post vital signs: Reviewed and stable  Last Vitals:  Vitals Value Taken Time  BP 145/72 10/21/23 1135  Temp 98.2   Pulse 73 10/21/23 1136  Resp 16   SpO2 99 % 10/21/23 1136  Vitals shown include unfiled device data.  Last Pain:  Vitals:   10/21/23 0810  PainSc: 5       Patients Stated Pain Goal: 4 (10/21/23 0800)  Complications: No notable events documented.

## 2023-10-21 NOTE — Interval H&P Note (Signed)
 History and Physical Interval Note:  10/21/2023 8:29 AM  Monique Weiss  has presented today for surgery, with the diagnosis of VENTRAL HERNIA, 4 CM.  The various methods of treatment have been discussed with the patient and family. After consideration of risks, benefits and other options for treatment, the patient has consented to  Procedure(s) with comments: REPAIR, HERNIA, VENTRAL, ROBOT-ASSISTED (N/A) - W/ MESH POSSIBLE OPEN as a surgical intervention.  The patient's history has been reviewed, patient examined, no change in status, stable for surgery.  I have reviewed the patient's chart and labs.  Questions were answered to the patient's satisfaction.     Awilda Bogus

## 2023-10-21 NOTE — Anesthesia Preprocedure Evaluation (Signed)
 Anesthesia Evaluation  Patient identified by MRN, date of birth, ID band Patient awake    Reviewed: Allergy & Precautions, NPO status , Patient's Chart, lab work & pertinent test results  History of Anesthesia Complications (+) PONV, PROLONGED EMERGENCE and history of anesthetic complications  Airway Mallampati: III  TM Distance: >3 FB Neck ROM: Full    Dental  (+) Dental Advisory Given, Missing   Pulmonary sleep apnea    Pulmonary exam normal breath sounds clear to auscultation       Cardiovascular Exercise Tolerance: Good hypertension, Pt. on medications Normal cardiovascular exam Rhythm:Regular Rate:Normal     Neuro/Psych   Anxiety      Neuromuscular disease (cervical myeolopathy)  negative psych ROS   GI/Hepatic Neg liver ROS,GERD  Medicated and Controlled,,  Endo/Other  diabetes, Well Controlled, Type 2, Oral Hypoglycemic AgentsHypothyroidism    Renal/GU negative Renal ROS  negative genitourinary   Musculoskeletal  (+) Arthritis , Osteoarthritis and Rheumatoid disorders,    Abdominal   Peds negative pediatric ROS (+)  Hematology negative hematology ROS (+)   Anesthesia Other Findings   Reproductive/Obstetrics negative OB ROS                             Anesthesia Physical Anesthesia Plan  ASA: 3  Anesthesia Plan: General   Post-op Pain Management: Dilaudid  IV   Induction: Intravenous  PONV Risk Score and Plan: Ondansetron , Dexamethasone , Midazolam  and Scopolamine  patch - Pre-op  Airway Management Planned: Oral ETT  Additional Equipment: None  Intra-op Plan:   Post-operative Plan: Extubation in OR  Informed Consent: I have reviewed the patients History and Physical, chart, labs and discussed the procedure including the risks, benefits and alternatives for the proposed anesthesia with the patient or authorized representative who has indicated his/her understanding and  acceptance.     Dental advisory given  Plan Discussed with: CRNA  Anesthesia Plan Comments:         Anesthesia Quick Evaluation

## 2023-10-22 ENCOUNTER — Encounter (HOSPITAL_COMMUNITY): Payer: Self-pay | Admitting: General Surgery

## 2023-11-18 ENCOUNTER — Ambulatory Visit (INDEPENDENT_AMBULATORY_CARE_PROVIDER_SITE_OTHER): Admitting: General Surgery

## 2023-11-18 ENCOUNTER — Ambulatory Visit: Admitting: General Surgery

## 2023-11-18 DIAGNOSIS — K439 Ventral hernia without obstruction or gangrene: Secondary | ICD-10-CM

## 2023-11-18 NOTE — Progress Notes (Signed)
 [facility]  I am calling the patient for post operative evaluation. This is not a billable encounter as it is under the global charges for the surgery.  The patient had a robotic ventral hernia with mesh on 10/21/23. The patient did not and her mailbox was full. We have not heard anything from the patient. She does lives out of town.   Will see the patient PRN.  Diet and activity as tolerated. Call with issues.   Deena Farrier, MD [facility] [address] [place], [place] [address] [phone] (office)

## 2023-11-27 ENCOUNTER — Ambulatory Visit: Admitting: General Surgery

## 2023-11-27 DIAGNOSIS — E1169 Type 2 diabetes mellitus with other specified complication: Secondary | ICD-10-CM | POA: Diagnosis not present

## 2023-11-27 DIAGNOSIS — M069 Rheumatoid arthritis, unspecified: Secondary | ICD-10-CM | POA: Diagnosis not present

## 2023-11-27 DIAGNOSIS — R609 Edema, unspecified: Secondary | ICD-10-CM | POA: Diagnosis not present

## 2023-11-27 DIAGNOSIS — K760 Fatty (change of) liver, not elsewhere classified: Secondary | ICD-10-CM | POA: Diagnosis not present

## 2023-11-27 DIAGNOSIS — N390 Urinary tract infection, site not specified: Secondary | ICD-10-CM | POA: Diagnosis not present

## 2023-12-09 DIAGNOSIS — E785 Hyperlipidemia, unspecified: Secondary | ICD-10-CM | POA: Diagnosis not present

## 2023-12-09 DIAGNOSIS — Z79899 Other long term (current) drug therapy: Secondary | ICD-10-CM | POA: Diagnosis not present

## 2023-12-09 DIAGNOSIS — M069 Rheumatoid arthritis, unspecified: Secondary | ICD-10-CM | POA: Diagnosis not present

## 2023-12-09 DIAGNOSIS — E118 Type 2 diabetes mellitus with unspecified complications: Secondary | ICD-10-CM | POA: Diagnosis not present

## 2024-01-14 ENCOUNTER — Other Ambulatory Visit: Payer: Self-pay | Admitting: General Surgery

## 2024-03-23 DIAGNOSIS — Z23 Encounter for immunization: Secondary | ICD-10-CM | POA: Diagnosis not present

## 2024-03-24 DIAGNOSIS — Z79899 Other long term (current) drug therapy: Secondary | ICD-10-CM | POA: Diagnosis not present

## 2024-03-24 DIAGNOSIS — E118 Type 2 diabetes mellitus with unspecified complications: Secondary | ICD-10-CM | POA: Diagnosis not present
